# Patient Record
Sex: Female | Born: 1975 | Race: Black or African American | Hispanic: No | Marital: Married
Health system: Southern US, Community
[De-identification: ages and names within clinical notes are randomized; demographics above are authoritative.]

## PROBLEM LIST (undated history)

## (undated) DIAGNOSIS — Z800 Family history of malignant neoplasm of digestive organs: Secondary | ICD-10-CM

## (undated) DIAGNOSIS — F32A Depression, unspecified: Secondary | ICD-10-CM

## (undated) DIAGNOSIS — R011 Cardiac murmur, unspecified: Secondary | ICD-10-CM

## (undated) DIAGNOSIS — E7800 Pure hypercholesterolemia, unspecified: Secondary | ICD-10-CM

## (undated) DIAGNOSIS — D219 Benign neoplasm of connective and other soft tissue, unspecified: Secondary | ICD-10-CM

## (undated) DIAGNOSIS — N912 Amenorrhea, unspecified: Secondary | ICD-10-CM

## (undated) DIAGNOSIS — N83209 Unspecified ovarian cyst, unspecified side: Secondary | ICD-10-CM

## (undated) DIAGNOSIS — F329 Major depressive disorder, single episode, unspecified: Secondary | ICD-10-CM

## (undated) DIAGNOSIS — N946 Dysmenorrhea, unspecified: Secondary | ICD-10-CM

## (undated) DIAGNOSIS — Z803 Family history of malignant neoplasm of breast: Secondary | ICD-10-CM

## (undated) HISTORY — DX: Pure hypercholesterolemia, unspecified: E78.00

## (undated) HISTORY — DX: Dysmenorrhea, unspecified: N94.6

## (undated) HISTORY — DX: Family history of malignant neoplasm of digestive organs: Z80.0

## (undated) HISTORY — DX: Cardiac murmur, unspecified: R01.1

## (undated) HISTORY — DX: Amenorrhea, unspecified: N91.2

## (undated) HISTORY — DX: Depression, unspecified: F32.A

## (undated) HISTORY — DX: Major depressive disorder, single episode, unspecified: F32.9

## (undated) HISTORY — DX: Unspecified ovarian cyst, unspecified side: N83.209

## (undated) HISTORY — DX: Family history of malignant neoplasm of breast: Z80.3

## (undated) HISTORY — PX: ABDOMINAL HYSTERECTOMY: SHX81

## (undated) HISTORY — DX: Benign neoplasm of connective and other soft tissue, unspecified: D21.9

---

## 2010-11-07 HISTORY — PX: MYOMECTOMY: SHX85

## 2016-10-12 LAB — CBC AND DIFFERENTIAL
HEMATOCRIT: 38 (ref 36–46)
HEMOGLOBIN: 12.5 (ref 12.0–16.0)
PLATELETS: 262 (ref 150–399)
WBC: 4.2

## 2017-11-22 ENCOUNTER — Ambulatory Visit (INDEPENDENT_AMBULATORY_CARE_PROVIDER_SITE_OTHER): Payer: BC Managed Care – PPO | Admitting: Family Medicine

## 2017-11-22 ENCOUNTER — Encounter: Payer: Self-pay | Admitting: Family Medicine

## 2017-11-22 VITALS — BP 100/60 | HR 71 | Temp 98.4°F | Ht 63.5 in | Wt 129.9 lb

## 2017-11-22 DIAGNOSIS — Z7689 Persons encountering health services in other specified circumstances: Secondary | ICD-10-CM

## 2017-11-22 DIAGNOSIS — N926 Irregular menstruation, unspecified: Secondary | ICD-10-CM | POA: Diagnosis not present

## 2017-11-22 DIAGNOSIS — F5101 Primary insomnia: Secondary | ICD-10-CM | POA: Diagnosis not present

## 2017-11-22 NOTE — Progress Notes (Signed)
Patient presents to clinic today to establish care.  SUBJECTIVE: PMH: Patient is a 42 year old female with past medical history significant for depression, allergies, HLD, acne.  Patient was formally seen by provider in [place].  Insomnia: -Times 1 week -Patient states she normally does not have trouble with sleeping. -Patient notices she may fall asleep but wake up a few hours later. -Patient endorses recent vivid dreams involving family and people she has been in relationship with. -Patient denies stress at work.  Patient states she loves her job.  Patient does endorse some stress in her relationship that she refers to as "messy". -Pt works out early in the day, does not drink caffeine.  Irregular menses: -Patient endorses history of myomectomy for fibroids in 2012, but states there was an issue with the surgery resulting in the possibility that the patient may not be able to carry a child. -Since then patient endorses irregular periods -LMP August 18, 2017 -Patient denies possibility of being pregnant -Patient does not want to take hormonal forms of birth control to regulate her period. -Patient states her twin sister underwent hysterectomy for similar issues.  Allergies: Shellfish-diarrhea Flagyl-itching, itching of throat  Social history: Patient is single.  She has her PhD in [place].  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  Patient states she loves her job.  Patient denies current tobacco and drug use.  Patient endorses social alcohol use.  Family medical history: Mom-alive, arthritis, HLD Dad-deceased, DM, MI, heart disease, HTN, kidney disease Sister-Kim, alive Sister-Kiley, alive MGM-deceased, colon and lung cancer MGF-deceased, DM PGM-deceased, breast cancer status post mastectomy, DM, MI, heart disease PGF-deceased, DM, heart disease  Health Maintenance: Mammogram --May 2017 PAP --August  2017   Past Medical History:  Diagnosis Date  . Heart murmur     History reviewed. No pertinent surgical history.  No current outpatient medications on file prior to visit.   No current facility-administered medications on file prior to visit.     Allergies  Allergen Reactions  . Flagyl [Metronidazole] Anaphylaxis    History reviewed. No pertinent family history.  Social History   Socioeconomic History  . Marital status: Single    Spouse name: Not on file  . Number of children: Not on file  . Years of education: Not on file  . Highest education level: Not on file  Social Needs  . Financial resource strain: Not on file  . Food insecurity - worry: Not on file  . Food insecurity - inability: Not on file  . Transportation needs - medical: Not on file  . Transportation needs - non-medical: Not on file  Occupational History  . Not on file  Tobacco Use  . Smoking status: Former Smoker    Types: Cigars  . Smokeless tobacco: Never Used  Substance and Sexual Activity  . Alcohol use: Yes    Comment: rarely  . Drug use: No  . Sexual activity: Not on file  Other Topics Concern  . Not on file  Social History Narrative  . Not on file    ROS General: Denies fever, chills, night sweats, changes in weight, changes in appetite +insomnia HEENT: Denies headaches, ear pain, changes in vision, rhinorrhea, sore throat CV: Denies CP, palpitations, SOB, orthopnea Pulm: Denies SOB, cough, wheezing GI: Denies abdominal pain, nausea, vomiting, diarrhea, constipation GU: Denies dysuria, hematuria, frequency, vaginal discharge   +AUB Msk: Denies muscle cramps, joint pains Neuro: Denies weakness, numbness, tingling  Skin: Denies rashes, bruising Psych: Denies depression, anxiety, hallucinations  BP 100/60 (BP Location: Left Arm, Patient Position: Sitting, Cuff Size: Normal)   Pulse 71   Temp 98.4 F (36.9 C) (Oral)   Ht 5' 3.5" (1.613 m)   Wt 129 lb 14.4 oz (58.9 kg)   BMI 22.65  kg/m   Physical Exam General: Denies fever, chills, night sweats, changes in weight, changes in appetite HEENT: Denies headaches, ear pain, changes in vision, rhinorrhea, sore throat CV: Denies CP, palpitations, SOB, orthopnea Pulm: Denies SOB, cough, wheezing GI: Denies abdominal pain, nausea, vomiting, diarrhea, constipation GU: Denies dysuria, hematuria, frequency, vaginal discharge Msk: Denies muscle cramps, joint pains Neuro: Denies weakness, numbness, tingling Skin: Denies rashes, bruising Psych: Denies depression, anxiety, hallucinations  No results found for this or any previous visit (from the past 2160 hour(s)).  Assessment/Plan: Primary insomnia -discussed sleep hygiene -given handout -Will try modifying routine x 1 month -also discussed counseling.  Pt amenable to the idea. Given a list of area counselors.  Irregular menses  -given h/o myomectomy will refer to OB/Gyn - Plan: Ambulatory referral to Obstetrics / Gynecology  Encounter to establish care -We reviewed the PMH, PSH, FH, SH, Meds and Allergies. -We provided refills for any medications we will prescribe as needed. -We addressed current concerns per orders and patient instructions. -We have asked for records for pertinent exams, studies, vaccines and notes from previous providers. -We have advised patient to follow up per instructions below.   F/u in 1 month.  Can schedule CPE.   Grier Mitts, MD

## 2017-11-22 NOTE — Patient Instructions (Addendum)
Dysfunctional Uterine Bleeding Dysfunctional uterine bleeding is abnormal bleeding from the uterus. Dysfunctional uterine bleeding includes:  A period that comes earlier or later than usual.  A period that is lighter, heavier, or has blood clots.  Bleeding between periods.  Skipping one or more periods.  Bleeding after sexual intercourse.  Bleeding after menopause.  Follow these instructions at home: Pay attention to any changes in your symptoms. Follow these instructions to help with your condition: Eating and drinking  Eat well-balanced meals. Include foods that are high in iron, such as liver, meat, shellfish, green leafy vegetables, and eggs.  If you become constipated: ? Drink plenty of water. ? Eat fruits and vegetables that are high in water and fiber, such as spinach, carrots, raspberries, apples, and mango. Medicines  Take over-the-counter and prescription medicines only as told by your health care provider.  Do not change medicines without talking with your health care provider.  Aspirin or medicines that contain aspirin may make the bleeding worse. Do not take those medicines: ? During the week before your period. ? During your period.  If you were prescribed iron pills, take them as told by your health care provider. Iron pills help to replace iron that your body loses because of this condition. Activity  If you need to change your sanitary pad or tampon more than one time every 2 hours: ? Lie in bed with your feet raised (elevated). ? Place a cold pack on your lower abdomen. ? Rest as much as possible until the bleeding stops or slows down.  Do not try to lose weight until the bleeding has stopped and your blood iron level is back to normal. Other Instructions  For two months, write down: ? When your period starts. ? When your period ends. ? When any abnormal bleeding occurs. ? What problems you notice.  Keep all follow up visits as told by your health  care provider. This is important. Contact a health care provider if:  You get light-headed or weak.  You have nausea and vomiting.  You cannot eat or drink without vomiting.  You feel dizzy or have diarrhea while you are taking medicines.  You are taking birth control pills or hormones, and you want to change them or stop taking them. Get help right away if:  You develop a fever or chills.  You need to change your sanitary pad or tampon more than one time per hour.  Your bleeding becomes heavier, or your flow contains clots more often.  You develop pain in your abdomen.  You lose consciousness.  You develop a rash. This information is not intended to replace advice given to you by your health care provider. Make sure you discuss any questions you have with your health care provider. Document Released: 10/21/2000 Document Revised: 03/31/2016 Document Reviewed: 01/19/2015 Elsevier Interactive Patient Education  2018 [employer]. Insomnia Insomnia is a sleep disorder that makes it difficult to fall asleep or to stay asleep. Insomnia can cause tiredness (fatigue), low energy, difficulty concentrating, mood swings, and poor performance at work or school. There are three different ways to classify insomnia:  Difficulty falling asleep.  Difficulty staying asleep.  Waking up too early in the morning.  Any type of insomnia can be long-term (chronic) or short-term (acute). Both are common. Short-term insomnia usually lasts for three months or less. Chronic insomnia occurs at least three times a week for longer than three months. What are the causes? Insomnia may be caused by another  condition, situation, or substance, such as:  Anxiety.  Certain medicines.  Gastroesophageal reflux disease (GERD) or other gastrointestinal conditions.  Asthma or other breathing conditions.  Restless legs syndrome, sleep apnea, or other sleep disorders.  Chronic pain.  Menopause. This may  include hot flashes.  Stroke.  Abuse of alcohol, tobacco, or illegal drugs.  Depression.  Caffeine.  Neurological disorders, such as Alzheimer disease.  An overactive thyroid (hyperthyroidism).  The cause of insomnia may not be known. What increases the risk? Risk factors for insomnia include:  Gender. Women are more commonly affected than men.  Age. Insomnia is more common as you get older.  Stress. This may involve your professional or personal life.  Income. Insomnia is more common in people with lower income.  Lack of exercise.  Irregular work schedule or night shifts.  Traveling between different time zones.  What are the signs or symptoms? If you have insomnia, trouble falling asleep or trouble staying asleep is the main symptom. This may lead to other symptoms, such as:  Feeling fatigued.  Feeling nervous about going to sleep.  Not feeling rested in the morning.  Having trouble concentrating.  Feeling irritable, anxious, or depressed.  How is this treated? Treatment for insomnia depends on the cause. If your insomnia is caused by an underlying condition, treatment will focus on addressing the condition. Treatment may also include:  Medicines to help you sleep.  Counseling or therapy.  Lifestyle adjustments.  Follow these instructions at home:  Take medicines only as directed by your health care provider.  Keep regular sleeping and waking hours. Avoid naps.  Keep a sleep diary to help you and your health care provider figure out what could be causing your insomnia. Include: ? When you sleep. ? When you wake up during the night. ? How well you sleep. ? How rested you feel the next day. ? Any side effects of medicines you are taking. ? What you eat and drink.  Make your bedroom a comfortable place where it is easy to fall asleep: ? Put up shades or special blackout curtains to block light from outside. ? Use a white noise machine to block  noise. ? Keep the temperature cool.  Exercise regularly as directed by your health care provider. Avoid exercising right before bedtime.  Use relaxation techniques to manage stress. Ask your health care provider to suggest some techniques that may work well for you. These may include: ? Breathing exercises. ? Routines to release muscle tension. ? Visualizing peaceful scenes.  Cut back on alcohol, caffeinated beverages, and cigarettes, especially close to bedtime. These can disrupt your sleep.  Do not overeat or eat spicy foods right before bedtime. This can lead to digestive discomfort that can make it hard for you to sleep.  Limit screen use before bedtime. This includes: ? Watching TV. ? Using your smartphone, tablet, and computer.  Stick to a routine. This can help you fall asleep faster. Try to do a quiet activity, brush your teeth, and go to bed at the same time each night.  Get out of bed if you are still awake after 15 minutes of trying to sleep. Keep the lights down, but try reading or doing a quiet activity. When you feel sleepy, go back to bed.  Make sure that you drive carefully. Avoid driving if you feel very sleepy.  Keep all follow-up appointments as directed by your health care provider. This is important. Contact a health care provider if:  You  are tired throughout the day or have trouble in your daily routine due to sleepiness.  You continue to have sleep problems or your sleep problems get worse. Get help right away if:  You have serious thoughts about hurting yourself or someone else. This information is not intended to replace advice given to you by your health care provider. Make sure you discuss any questions you have with your health care provider. Document Released: 10/21/2000 Document Revised: 03/25/2016 Document Reviewed: 07/25/2014 Elsevier Interactive Patient Education  [employer].

## 2017-11-23 ENCOUNTER — Other Ambulatory Visit: Payer: Self-pay

## 2017-11-23 ENCOUNTER — Ambulatory Visit (INDEPENDENT_AMBULATORY_CARE_PROVIDER_SITE_OTHER): Payer: BC Managed Care – PPO | Admitting: Obstetrics and Gynecology

## 2017-11-23 ENCOUNTER — Encounter: Payer: Self-pay | Admitting: Obstetrics and Gynecology

## 2017-11-23 VITALS — BP 118/70 | HR 72 | Resp 16 | Ht 63.25 in | Wt 132.0 lb

## 2017-11-23 DIAGNOSIS — N946 Dysmenorrhea, unspecified: Secondary | ICD-10-CM | POA: Diagnosis not present

## 2017-11-23 DIAGNOSIS — D259 Leiomyoma of uterus, unspecified: Secondary | ICD-10-CM | POA: Diagnosis not present

## 2017-11-23 DIAGNOSIS — N926 Irregular menstruation, unspecified: Secondary | ICD-10-CM

## 2017-11-23 LAB — POCT URINE PREGNANCY: Preg Test, Ur: NEGATIVE

## 2017-11-23 MED ORDER — NAPROXEN SODIUM 550 MG PO TABS: ORAL_TABLET | ORAL | 2 refills | Status: DC

## 2017-11-23 MED ORDER — MEDROXYPROGESTERONE ACETATE 5 MG PO TABS: ORAL_TABLET | ORAL | 6 refills | Status: DC

## 2017-11-23 NOTE — Patient Instructions (Signed)
Uterine Fibroids Uterine fibroids are tissue masses (tumors) that can develop in the womb (uterus). They are also called leiomyomas. This type of tumor is not cancerous (benign) and does not spread to other parts of the body outside of the pelvic area, which is between the hip bones. Occasionally, fibroids may develop in the fallopian tubes, in the cervix, or on the support structures (ligaments) that surround the uterus. You can have one or many fibroids. Fibroids can vary in size, weight, and where they grow in the uterus. Some can become quite large. Most fibroids do not require medical treatment. What are the causes? A fibroid can develop when a single uterine cell keeps growing (replicating). Most cells in the human body have a control mechanism that keeps them from replicating without control. What are the signs or symptoms? Symptoms may include:  Heavy bleeding during your period.  Bleeding or spotting between periods.  Pelvic pain and pressure.  Bladder problems, such as needing to urinate more often (urinary frequency) or urgently.  Inability to reproduce offspring (infertility).  Miscarriages.  How is this diagnosed? Uterine fibroids are diagnosed through a physical exam. Your health care provider may feel the lumpy tumors during a pelvic exam. Ultrasonography and an MRI may be done to determine the size, location, and number of fibroids. How is this treated? Treatment may include:  Watchful waiting. This involves getting the fibroid checked by your health care provider to see if it grows or shrinks. Follow your health care provider's recommendations for how often to have this checked.  Hormone medicines. These can be taken by mouth or given through an intrauterine device (IUD).  Surgery. ? Removing the fibroids (myomectomy) or the uterus (hysterectomy). ? Removing blood supply to the fibroids (uterine artery embolization).  If fibroids interfere with your fertility and you  want to become pregnant, your health care provider may recommend having the fibroids removed. Follow these instructions at home:  Keep all follow-up visits as directed by your health care provider. This is important.  Take over-the-counter and prescription medicines only as told by your health care provider. ? If you were prescribed a hormone treatment, take the hormone medicines exactly as directed.  Ask your health care provider about taking iron pills and increasing the amount of dark green, leafy vegetables in your diet. These actions can help to boost your blood iron levels, which may be affected by heavy menstrual bleeding.  Pay close attention to your period and tell your health care provider about any changes, such as: ? Increased blood flow that requires you to use more pads or tampons than usual per month. ? A change in the number of days that your period lasts per month. ? A change in symptoms that are associated with your period, such as abdominal cramping or back pain. Contact a health care provider if:  You have pelvic pain, back pain, or abdominal cramps that cannot be controlled with medicines.  You have an increase in bleeding between and during periods.  You soak tampons or pads in a half hour or less.  You feel lightheaded, extra tired, or weak. Get help right away if:  You faint.  You have a sudden increase in pelvic pain. This information is not intended to replace advice given to you by your health care provider. Make sure you discuss any questions you have with your health care provider. Document Released: 10/21/2000 Document Revised: 06/23/2016 Document Reviewed: 04/22/2014 Elsevier Interactive Patient Education  2018 Elsevier Inc.  

## 2017-11-23 NOTE — Progress Notes (Addendum)
42 y.o. G0P0000 SingleAfrican AmericanF here for a consultation from Grier Mitts, MD for irregular menses and a h/o a fibroid uterus. Patient has not had menses since October 2018. Would like to discuss possible hysterectomy In 2006 her cycles started getting very heavy and irregular. In 2012, she had a myomectomy (via laparotomy), removed part of one of her tubes. She had an HSG and she had tubal blockage.  Over the last year her cycles have come every month, every 3-4 weeks. Then skipped her cycle in 11/18 and 12/18. Bleeds x 4-5 days. She can saturate a pad every 5-6 hours. Current she is having spotting. Wasn't bleeding in between her cycles prior to now. In the past she has gone up to 2 months without a cycle. Cramps are moderate, takes advil, 4-6 tablets, that helps. Sexually active with women, no current partner.  After her surgery she was told she wouldn't be able to carry a child full term.  In cycle in August was very painful, she had nausea, back pain, leg pain. Miserable x 5 hours.  No galactorrhea, no thyroid c/o.  She was on OCP's in the  Period Cycle (Days): (irregular) Period Duration (Days): 4 Period Pattern: (!) Irregular Menstrual Flow: Moderate, Heavy Menstrual Control: Maxi pad Menstrual Control Change Freq (Hours): 3-4 Dysmenorrhea: (!) Moderate Dysmenorrhea Symptoms: Cramping, Nausea, Other (Comment)(leg cramps)  Patient's last menstrual period was 08/14/2017.          Sexually active: No.  The current method of family planning is none.    Exercising: Yes.    weight lifting, yoga, cardio Smoker:  no  Health Maintenance: Pap:  2018 per patient in [place] - normal, in the summer History of abnormal Pap:  Yes, per patient, f/u was normal.  MMG: 12/12/14 per patient Colonoscopy:  none BMD:   none TDaP:  Unsure, thinks UTD Gardasil: n/a   reports that she has quit smoking. Her smoking use included cigars. she has never used smokeless tobacco. She reports that she drinks  alcohol. She reports that she does not use drugs. Rare ETOH. 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 Just moved here from [place] this summer.   Past Medical History:  Diagnosis Date  . Amenorrhea   . Depression   . Fibroid   . Heart murmur   . Menstrual cramps   . Ovarian cyst     Past Surgical History:  Procedure Laterality Date  . MYOMECTOMY  2012    Current Outpatient Medications  Medication Sig Dispense Refill  . Ascorbic Acid (VITAMIN C PO) Take by mouth daily.    Marland Kitchen BIOTIN PO Take by mouth.    . Cyanocobalamin (B-12 PO) Take by mouth daily.    . Folic [id number] (FOLIC ACID XTRA PO) Take by mouth daily.    Marland Kitchen MAGNESIUM GLUCONATE PO Take by mouth.    . Multiple Vitamin (MULTIVITAMIN) tablet Take 1 tablet by mouth daily.    . Probiotic Product (PROBIOTIC DAILY PO) Take by mouth.     No current facility-administered medications for this visit.     Family History  Problem Relation Age of Onset  . Fibroids Mother   . Diabetes Father   . Fibroids Sister   . Colon cancer Maternal Grandmother   . Lung cancer Maternal Grandmother   . Cancer Maternal Grandmother   . Cancer Maternal Grandfather   . Breast cancer Paternal Grandmother   . Cancer Paternal Grandmother     Review of Systems  Constitutional: Negative.  HENT: Negative.   Eyes: Negative.   Respiratory: Negative.   Cardiovascular: Negative.   Gastrointestinal: Negative.   Endocrine: Negative.   Genitourinary:       Irregular menses  Musculoskeletal: Negative.   Skin: Negative.   Allergic/Immunologic: Negative.   Neurological: Negative.   Hematological: Negative.   Psychiatric/Behavioral: Negative.     Exam:   BP 118/70 (BP Location: Right Arm, Patient Position: Sitting, Cuff Size: Normal)   Pulse 72   Resp 16   Ht 5' 3.25" (1.607 m)   Wt 132 lb (59.9 kg)   LMP 08/14/2017   BMI 23.20 kg/m   Weight change: @WEIGHTCHANGE @ Height:   Height: 5' 3.25" (160.7 cm)  Ht Readings from  Last 3 Encounters:  11/23/17 5' 3.25" (1.607 m)  11/22/17 5' 3.5" (1.613 m)    General appearance: alert, cooperative and appears stated age Head: Normocephalic, without obvious abnormality, atraumatic Neck: no adenopathy, supple, symmetrical, trachea midline and thyroid normal to inspection and palpation Lungs: clear to auscultation bilaterally Cardiovascular: regular rate and rhythm Abdomen: soft, non-tender; non distended,  no masses,  no organomegaly Extremities: extremities normal, atraumatic, no cyanosis or edema Skin: Skin color, texture, turgor normal. No rashes or lesions Lymph nodes: Cervical, supraclavicular, and axillary nodes normal. No abnormal inguinal nodes palpated Neurologic: Grossly normal   Pelvic: External genitalia:  no lesions              Urethra:  normal appearing urethra with no masses, tenderness or lesions              Bartholins and Skenes: normal                 Vagina: normal appearing vagina with normal color and discharge, no lesions              Cervix: no lesions               Bimanual Exam:  Uterus:  anteverted, mobile, slightly enlarged, not tender              Adnexa: no mass, fullness, tenderness               Rectovaginal: Confirms               Anus:  normal sphincter tone, no lesions  Chaperone was present for exam.  A:  Oligomenorrhea  Dysmenorrhea  H/O fibroid uterus  P:   TSH, prolactin  Discussed options of cyclic provera, OCP's and the mirena IUD for cycle control  Discussed the risks and side effects, currently she declines the mirena and OCP's  Provera called in  Recommend U/S   Anaprox DS for cramps  Get a copy of her myomectomy op note  CC:  Grier Mitts, MD Letter sent  Addendum: Op note and pathology report reviewed. 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 Four other fibroids ranging from 1.5-3.2 cm. Op note describes that the adenomyoma involved  a large portion of the endometrium posteriorly. "It appeared that the right tube had been disrupted. It was difficult to tell whether the left tube as disrupted." Interceed was placed over the uterine incisions. No chromopertubation was done.

## 2017-11-24 LAB — PROLACTIN: Prolactin: 6.5 ng/mL (ref 4.8–23.3)

## 2017-11-24 LAB — TSH: TSH: 0.929 u[IU]/mL (ref 0.450–4.500)

## 2017-11-27 ENCOUNTER — Encounter: Payer: Self-pay | Admitting: Family Medicine

## 2017-11-29 ENCOUNTER — Telehealth: Payer: Self-pay | Admitting: Obstetrics and Gynecology

## 2017-11-29 NOTE — Telephone Encounter (Signed)
Call placed to patient to review benefits for recommended ultrasound. Left voicemail message requesting a return call  °

## 2017-12-04 NOTE — Telephone Encounter (Signed)
Patient returned call. Reviewed benefit for ultrasound. Patient understood and agreeable. Patient ready to schedule. Patient scheduled 12/12/17 with Dr Talbert Nan. Patient aware of appointment date, arrival time and cancellation policy. No further questions. Ok to close

## 2017-12-07 ENCOUNTER — Encounter: Payer: Self-pay | Admitting: Family Medicine

## 2017-12-08 ENCOUNTER — Ambulatory Visit (INDEPENDENT_AMBULATORY_CARE_PROVIDER_SITE_OTHER): Payer: BC Managed Care – PPO | Admitting: Family Medicine

## 2017-12-08 ENCOUNTER — Other Ambulatory Visit (HOSPITAL_COMMUNITY)
Admission: RE | Admit: 2017-12-08 | Discharge: 2017-12-08 | Disposition: A | Discharge status: Home / Self Care | Payer: BC Managed Care – PPO | Source: Ambulatory Visit | Attending: Family Medicine | Admitting: Family Medicine

## 2017-12-08 ENCOUNTER — Encounter: Payer: Self-pay | Admitting: Family Medicine

## 2017-12-08 VITALS — BP 90/60 | HR 71 | Temp 98.1°F | Wt 129.7 lb

## 2017-12-08 DIAGNOSIS — Z0000 Encounter for general adult medical examination without abnormal findings: Secondary | ICD-10-CM | POA: Insufficient documentation

## 2017-12-08 NOTE — Patient Instructions (Addendum)
Preventive Care 18-39 Years, Female Preventive care refers to lifestyle choices and visits with your health care provider that can promote health and wellness. What does preventive care include?  A yearly physical exam. This is also called an annual well check.  Dental exams once or twice a year.  Routine eye exams. Ask your health care provider how often you should have your eyes checked.  Personal lifestyle choices, including: ? Daily care of your teeth and gums. ? Regular physical activity. ? Eating a healthy diet. ? Avoiding tobacco and drug use. ? Limiting alcohol use. ? Practicing safe sex. ? Taking vitamin and mineral supplements as recommended by your health care provider. What happens during an annual well check? The services and screenings done by your health care provider during your annual well check will depend on your age, overall health, lifestyle risk factors, and family history of disease. Counseling Your health care provider may ask you questions about your:  Alcohol use.  Tobacco use.  Drug use.  Emotional well-being.  Home and relationship well-being.  Sexual activity.  Eating habits.  Work and work Statistician.  Method of birth control.  Menstrual cycle.  Pregnancy history.  Screening You may have the following tests or measurements:  Height, weight, and BMI.  Diabetes screening. This is done by checking your blood sugar (glucose) after you have not eaten for a while (fasting).  Blood pressure.  Lipid and cholesterol levels. These may be checked every 5 years starting at age 66.  Skin check.  Hepatitis C blood test.  Hepatitis B blood test.  Sexually transmitted disease (STD) testing.  BRCA-related cancer screening. This may be done if you have a family history of breast, ovarian, tubal, or peritoneal cancers.  Pelvic exam and Pap test. This may be done every 3 years starting at age 40. Starting at age 59, this may be done every 5  years if you have a Pap test in combination with an HPV test.  Discuss your test results, treatment options, and if necessary, the need for more tests with your health care provider. Vaccines Your health care provider may recommend certain vaccines, such as:  Influenza vaccine. This is recommended every year.  Tetanus, diphtheria, and acellular pertussis (Tdap, Td) vaccine. You may need a Td booster every 10 years.  Varicella vaccine. You may need this if you have not been vaccinated.  HPV vaccine. If you are 69 or younger, you may need three doses over 6 months.  Measles, mumps, and rubella (MMR) vaccine. You may need at least one dose of MMR. You may also need a second dose.  Pneumococcal 13-valent conjugate (PCV13) vaccine. You may need this if you have certain conditions and were not previously vaccinated.  Pneumococcal polysaccharide (PPSV23) vaccine. You may need one or two doses if you smoke cigarettes or if you have certain conditions.  Meningococcal vaccine. One dose is recommended if you are age 27-21 years and a first-year college student living in a residence hall, or if you have one of several medical conditions. You may also need additional booster doses.  Hepatitis A vaccine. You may need this if you have certain conditions or if you travel or work in places where you may be exposed to hepatitis A.  Hepatitis B vaccine. You may need this if you have certain conditions or if you travel or work in places where you may be exposed to hepatitis B.  Haemophilus influenzae type b (Hib) vaccine. You may need this if  you have certain risk factors.  Talk to your health care provider about which screenings and vaccines you need and how often you need them. This information is not intended to replace advice given to you by your health care provider. Make sure you discuss any questions you have with your health care provider. Document Released: 12/20/2001 Document Revised: 07/13/2016  Document Reviewed: 08/25/2015 Elsevier Interactive Patient Education  [employer].

## 2017-12-08 NOTE — Progress Notes (Signed)
Subjective:     Meredith Lee is a 42 y.o. female and is here for a comprehensive physical exam. The patient reports no problems since last OFV.  Pt does report her menses finally started.  Social History   Socioeconomic History  . Marital status: Single    Spouse name: Not on file  . Number of children: Not on file  . Years of education: Not on file  . Highest education level: Not on file  Social Needs  . Financial resource strain: Not on file  . Food insecurity - worry: Not on file  . Food insecurity - inability: Not on file  . Transportation needs - medical: Not on file  . Transportation needs - non-medical: Not on file  Occupational History  . Not on file  Tobacco Use  . Smoking status: Former Smoker    Types: Cigars  . Smokeless tobacco: Never Used  Substance and Sexual Activity  . Alcohol use: Yes    Comment: rarely  . Drug use: No  . Sexual activity: Not Currently    Birth control/protection: None    Comment: female partner  Other Topics Concern  . Not on file  Social History Narrative  . Not on file   Health Maintenance  Topic Date Due  . TETANUS/TDAP  06/29/1997  . PAP SMEAR  06/30/1999  . INFLUENZA VACCINE  07/25/2018 (Originally 06/07/2017)  . HIV Screening  11/22/2018 (Originally 06/29/1993)    The following portions of the patient's history were reviewed and updated as appropriate: allergies, current medications, past family history, past medical history, past social history, past surgical history and problem list.  Review of Systems A comprehensive review of systems was negative.   Objective:    BP 90/60 (BP Location: Right Arm, Patient Position: Sitting, Cuff Size: Normal)   Pulse 71   Temp 98.1 F (36.7 C) (Oral)   Wt 129 lb 11.2 oz (58.8 kg)   BMI 22.79 kg/m  General appearance: alert, cooperative and appears stated age Head: Normocephalic, without obvious abnormality, atraumatic Eyes: conjunctivae/corneas clear. PERRL, EOM's intact.  Fundi benign.  Wearing glasses Ears: normal TM's and external ear canals both ears Nose: Nares normal. Septum midline. Mucosa normal. No drainage or sinus tenderness. Throat: lips, mucosa, and tongue normal; teeth and gums normal Neck: no adenopathy, no carotid bruit, no JVD, supple, symmetrical, trachea midline and thyroid not enlarged, symmetric, no tenderness/mass/nodules Lungs: clear to auscultation bilaterally Breasts: normal appearance, no masses or tenderness Heart: regular rate and rhythm, S1, S2 normal, no murmur, click, rub or gallop No edema  MSK:  FROM, no deformitie Abd: BS present, soft, NT/ND, no hepatosplenomegaly Neuro: ANO x3, CN II through XII grossly intact, normal gait. GU: Normal external female genitalia without lesions, normal vaginal rugae without lesions, cervix normal in appearance, no CMT.  Pap obtained.  Present. Lymph: No cervical lymphadenopathy no axillary lymphadenopathy Pulses: 2+ throughout Psych: Mood appropriate, normal affect  Assessment:    Healthy female exam.      Plan:      Anticipatory guidance given including wearing seatbelts, increasing physical activity, increasing p.o. intake of water, smoke detectors in the home, limiting alcohol intake. -Patient recently had labs at outside office which were normal. -Pap smear obtained this visit.  Will call patient with results -Immunizations up-to-date.  Patient offered influenza vaccine but declines. See After Visit Summary for Counseling Recommendations   Follow-up PRN.  Next CPE in 1 year.  Grier Mitts, MD

## 2017-12-12 ENCOUNTER — Other Ambulatory Visit: Payer: Self-pay

## 2017-12-12 ENCOUNTER — Ambulatory Visit (INDEPENDENT_AMBULATORY_CARE_PROVIDER_SITE_OTHER): Payer: BC Managed Care – PPO | Admitting: Obstetrics and Gynecology

## 2017-12-12 ENCOUNTER — Encounter: Payer: Self-pay | Admitting: Obstetrics and Gynecology

## 2017-12-12 ENCOUNTER — Ambulatory Visit (INDEPENDENT_AMBULATORY_CARE_PROVIDER_SITE_OTHER): Payer: BC Managed Care – PPO

## 2017-12-12 VITALS — BP 108/60 | HR 64 | Resp 16 | Wt 129.0 lb

## 2017-12-12 DIAGNOSIS — N939 Abnormal uterine and vaginal bleeding, unspecified: Secondary | ICD-10-CM | POA: Diagnosis not present

## 2017-12-12 DIAGNOSIS — D259 Leiomyoma of uterus, unspecified: Secondary | ICD-10-CM | POA: Diagnosis not present

## 2017-12-12 DIAGNOSIS — N946 Dysmenorrhea, unspecified: Secondary | ICD-10-CM

## 2017-12-12 MED ORDER — MEDROXYPROGESTERONE ACETATE 5 MG PO TABS: ORAL_TABLET | ORAL | 1 refills | Status: DC

## 2017-12-12 NOTE — Progress Notes (Signed)
GYNECOLOGY  VISIT   HPI: 42 y.o.   Single  African American  female   [id number] with No LMP recorded. Patient is not currently having periods (Reason: Irregular Periods).   here for  ultrasound  The patient was seen last month c/o irregular bleeding. She had been cycling every 3-4 weeks x 4-5 days until 10/18. She then skipped her cycle in 11/18 and 12/18. Some spotting in 1/19, then went on to get her cycle on 11/25/17. She bleed for 7 days, medium flow, tolerable cramps. Passed blot clots. She started spotting again today. In the past she has gone up to 2 months without a cycle. She typically has moderate cramps, occasionally severe cramps.  H/O fibroid uterus and myomectomy (via laparotomy).  She declines OCP's and the mirena IUD, was given a script for anaprox ds and provera at her last visit. She didn't need to take the provera because her cycle started on it's own.  Recent normal TSH and prolactin.  She was previously told she couldn't carry a pregnancy because of her myomectomies.   GYNECOLOGIC HISTORY: No LMP recorded. Patient is not currently having periods (Reason: Irregular Periods). Contraception:female partner Menopausal hormone therapy: none        OB History    Gravida Para Term Preterm AB Living   0 0 0 0 0 0   SAB TAB Ectopic Multiple Live Births   0 0 0 0 0         There are no active problems to display for this patient.   Past Medical History:  Diagnosis Date  . Amenorrhea   . Depression   . Fibroid   . Heart murmur   . Menstrual cramps   . Ovarian cyst     Past Surgical History:  Procedure Laterality Date  . MYOMECTOMY  2012    Current Outpatient Medications  Medication Sig Dispense Refill  . Ascorbic Acid (VITAMIN C PO) Take by mouth daily.    Marland Kitchen BIOTIN PO Take by mouth.    . Cyanocobalamin (B-12 PO) Take by mouth daily.    . Folic [id number] (FOLIC ACID XTRA PO) Take by mouth daily.    Marland Kitchen MAGNESIUM GLUCONATE PO Take by mouth.    .  Multiple Vitamin (MULTIVITAMIN) tablet Take 1 tablet by mouth daily.    . Probiotic Product (PROBIOTIC DAILY PO) Take by mouth.     No current facility-administered medications for this visit.      ALLERGIES: Flagyl [metronidazole] and Shellfish allergy  Family History  Problem Relation Age of Onset  . Fibroids Mother   . Diabetes Father   . Fibroids Sister   . Colon cancer Maternal Grandmother   . Lung cancer Maternal Grandmother   . Cancer Maternal Grandmother   . Cancer Maternal Grandfather   . Breast cancer Paternal Grandmother   . Cancer Paternal Grandmother     Social History   Socioeconomic History  . Marital status: Single    Spouse name: Not on file  . Number of children: Not on file  . Years of education: Not on file  . Highest education level: Not on file  Social Needs  . Financial resource strain: Not on file  . Food insecurity - worry: Not on file  . Food insecurity - inability: Not on file  . Transportation needs - medical: Not on file  . Transportation needs - non-medical: Not on file  Occupational History  . Not on file  Tobacco Use  . Smoking status:  Former Smoker    Types: Cigars  . Smokeless tobacco: Never Used  Substance and Sexual Activity  . Alcohol use: Yes    Comment: rarely  . Drug use: No  . Sexual activity: Not Currently    Birth control/protection: None    Comment: female partner  Other Topics Concern  . Not on file  Social History Narrative  . Not on file    Review of Systems  Constitutional: Negative.   HENT: Negative.   Eyes: Negative.   Respiratory: Negative.   Cardiovascular: Negative.   Gastrointestinal: Negative.   Genitourinary: Negative.   Musculoskeletal: Negative.   Skin: Negative.   Neurological: Negative.   Endo/Heme/Allergies: Negative.   Psychiatric/Behavioral: Negative.     PHYSICAL EXAMINATION:    There were no vitals taken for this visit.    General appearance: alert, cooperative and appears stated  age  Ultrasound images were reviewed with the patient, several small fibroids noted. Cystic structure next to the endometrium (?scar tissue from prior myomectomies, benign appearing)  ASSESSMENT AUB,normal TSH and prolactin Dysmenorrhea, only occasionally severe Fibroid uterus  She has questions about the possibility of carrying a pregnancy     PLAN She will calendar her cycles and f/u in 3 months She has anaprox for cramps if needed Will uses cyclic provera every other month if no spontaneous menses I told her I didn't see any obvious defects in her myometrium that would give me concern about her getting pregnant, but would recommend she have a consultation with a fertility specialist if desired (she also has a h/o tubal blockage). She will let me know if she wants to do this.    An After Visit Summary was printed and given to the patient.  Over 10 minutes face to face time of which over 50% was spent in counseling.   CC: Dr Volanda Napoleon

## 2017-12-13 LAB — CYTOLOGY - PAP
DIAGNOSIS: UNDETERMINED — AB
HPV: DETECTED — AB

## 2017-12-15 ENCOUNTER — Telehealth: Payer: Self-pay | Admitting: Obstetrics and Gynecology

## 2017-12-15 DIAGNOSIS — R87610 Atypical squamous cells of undetermined significance on cytologic smear of cervix (ASC-US): Secondary | ICD-10-CM

## 2017-12-15 DIAGNOSIS — R87810 Cervical high risk human papillomavirus (HPV) DNA test positive: Principal | ICD-10-CM

## 2017-12-15 NOTE — Telephone Encounter (Signed)
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 They told me to call your office and schedule an appointment."

## 2017-12-15 NOTE — Telephone Encounter (Signed)
Spoke with patient. Advised patient Dr. Talbert Nan will review PAP results dated 12/08/17, will return call with recommendations.   Brief explanation of colpo provided, questions answered.   Patient is SA, female partner.   LMP 12/11/17.   Dr. Talbert Nan  -ASCUS PAP, positive hpv, ok to proceed with colpo?

## 2017-12-15 NOTE — Telephone Encounter (Signed)
Yes, please set her up for a colposcopy

## 2017-12-15 NOTE — Telephone Encounter (Signed)
Spoke with patient. Colposcopy scheduled for 12/19/2017 at 11:30 am with Dr.Jertson. Patient is agreeable to date and time.   Instructions given. Motrin 800 mg po x , one hour before appointment with food. Make sure to eat a meal before appointment and drink plenty of fluids. Patient verbalized understanding and will call to reschedule if will be on menses or has any concerns regarding pregnancy. Patient agreeable and verbalized understanding of all instructions. Order placed.  Routing to provider for final review. Patient agreeable to disposition. Will close encounter.

## 2017-12-19 ENCOUNTER — Ambulatory Visit: Payer: BC Managed Care – PPO | Admitting: Obstetrics and Gynecology

## 2017-12-19 ENCOUNTER — Encounter: Payer: Self-pay | Admitting: Obstetrics and Gynecology

## 2017-12-19 ENCOUNTER — Other Ambulatory Visit: Payer: Self-pay

## 2017-12-19 VITALS — BP 102/60 | HR 76 | Resp 14 | Wt 129.0 lb

## 2017-12-19 DIAGNOSIS — Z01812 Encounter for preprocedural laboratory examination: Secondary | ICD-10-CM

## 2017-12-19 DIAGNOSIS — R87610 Atypical squamous cells of undetermined significance on cytologic smear of cervix (ASC-US): Secondary | ICD-10-CM | POA: Diagnosis not present

## 2017-12-19 DIAGNOSIS — R87810 Cervical high risk human papillomavirus (HPV) DNA test positive: Secondary | ICD-10-CM | POA: Diagnosis not present

## 2017-12-19 LAB — POCT URINE PREGNANCY: Preg Test, Ur: NEGATIVE

## 2017-12-19 NOTE — Progress Notes (Signed)
GYNECOLOGY  VISIT   HPI: 42 y.o.   Single  African American  female   [place] with Patient's last menstrual period was 12/18/2017.   here for a colposcopy, recent pap with her primary returned with ASCUS, +HPV     GYNECOLOGIC HISTORY: Patient's last menstrual period was 12/18/2017. Contraception:none  Menopausal hormone therapy: none         OB History    Gravida Para Term Preterm AB Living   0 0 0 0 0 0   SAB TAB Ectopic Multiple Live Births   0 0 0 0 0         There are no active problems to display for this patient.   Past Medical History:  Diagnosis Date  . Amenorrhea   . Depression   . Fibroid   . Heart murmur   . Menstrual cramps   . Ovarian cyst     Past Surgical History:  Procedure Laterality Date  . MYOMECTOMY  2012    Current Outpatient Medications  Medication Sig Dispense Refill  . Ascorbic Acid (VITAMIN C PO) Take by mouth daily.    Marland Kitchen BIOTIN PO Take by mouth.    . Cyanocobalamin (B-12 PO) Take by mouth daily.    . Folic [id number] (FOLIC ACID XTRA PO) Take by mouth daily.    Marland Kitchen MAGNESIUM GLUCONATE PO Take by mouth.    . medroxyPROGESTERone (PROVERA) 5 MG tablet Take one tablet a day for 5 days every other month if no spontaneous cycle. 15 tablet 1  . Multiple Vitamin (MULTIVITAMIN) tablet Take 1 tablet by mouth daily.    . Probiotic Product (PROBIOTIC DAILY PO) Take by mouth.     No current facility-administered medications for this visit.      ALLERGIES: Flagyl [metronidazole] and Shellfish allergy  Family History  Problem Relation Age of Onset  . Fibroids Mother   . Diabetes Father   . Fibroids Sister   . Colon cancer Maternal Grandmother   . Lung cancer Maternal Grandmother   . Cancer Maternal Grandmother   . Cancer Maternal Grandfather   . Breast cancer Paternal Grandmother   . Cancer Paternal Grandmother     Social History   Socioeconomic History  . Marital status: Single    Spouse name: Not on file  . Number of  children: Not on file  . Years of education: Not on file  . Highest education level: Not on file  Social Needs  . Financial resource strain: Not on file  . Food insecurity - worry: Not on file  . Food insecurity - inability: Not on file  . Transportation needs - medical: Not on file  . Transportation needs - non-medical: Not on file  Occupational History  . Not on file  Tobacco Use  . Smoking status: Former Smoker    Types: Cigars  . Smokeless tobacco: Never Used  Substance and Sexual Activity  . Alcohol use: Yes    Comment: rarely  . Drug use: No  . Sexual activity: Not Currently    Birth control/protection: None    Comment: female partner  Other Topics Concern  . Not on file  Social History Narrative  . Not on file    Review of Systems  Constitutional: Negative.   HENT: Negative.   Eyes: Negative.   Respiratory: Negative.   Cardiovascular: Negative.   Gastrointestinal: Negative.   Genitourinary: Negative.   Musculoskeletal: Negative.   Skin: Negative.   Neurological: Negative.   Endo/Heme/Allergies: Negative.  Psychiatric/Behavioral: Negative.     PHYSICAL EXAMINATION:    BP 102/60 (BP Location: Right Arm, Patient Position: Sitting, Cuff Size: Normal)   Pulse 76   Resp 14   Wt 129 lb (58.5 kg)   LMP 12/18/2017   BMI 22.67 kg/m     General appearance: alert, cooperative and appears stated age   Pelvic: External genitalia:  no lesions              Urethra:  normal appearing urethra with no masses, tenderness or lesions              Bartholins and Skenes: normal                 Vagina: normal appearing vagina with normal color and discharge, no lesions              Cervix:no gross lesions  Colposcopy: unsatisfactory, no aceto-white changes, negative lugols examination of the cervix and vagina. ECC done  ASSESSMENT ASCUS, +HPV pap    PLAN Explained her abnormal pap, discussed need for f/u and possible need for treatment Colposocopy with ECC Further  plans based on the results    An After Visit Summary was printed and given to the patient.  10 minutes face to face time of which over 50% was spent in counseling.   CC: Grier Mitts, MD

## 2017-12-19 NOTE — Patient Instructions (Signed)

## 2017-12-22 ENCOUNTER — Telehealth: Payer: Self-pay

## 2017-12-22 NOTE — Telephone Encounter (Signed)
-----   Message from Salvadore Dom, MD sent at 12/21/2017  4:54 PM EST ----- Please inform the patient that her cervical biopsy (E[facility]) was negative. She should have a f/u pap and hpv with Dr Volanda Napoleon next year.  CC: Dr Volanda Napoleon

## 2017-12-22 NOTE — Telephone Encounter (Signed)
Results given to patient as seen below. Patient verbalizes understanding. Encounter closed.

## 2017-12-26 ENCOUNTER — Telehealth: Payer: Self-pay | Admitting: Family Medicine

## 2017-12-26 NOTE — Telephone Encounter (Signed)
Copied from [place]. Topic: Quick Communication - See Telephone Encounter >> Dec 26, 2017 12:14 PM Boyd Kerbs wrote: CRM for notification. See Telephone encounter for:   She went to see referred physician and wants to follow up. Please call her   12/26/17.

## 2018-01-01 NOTE — Telephone Encounter (Signed)
Please advise 

## 2018-01-03 NOTE — Telephone Encounter (Signed)
Pt called.  She was not sure if this provider was aware of her colpo results.  This provider informed pt that she had been made aware of the results by her OB/Gyn provider.

## 2018-03-12 ENCOUNTER — Telehealth: Payer: Self-pay | Admitting: Obstetrics and Gynecology

## 2018-03-12 NOTE — Telephone Encounter (Signed)
Patient pressed cancel at her automated reminder call to see Dr. Talbert Nan on 03/14/18 for a three month recheck. I returned her call and left her a message to call back and reschedule. Routing to provider for review.

## 2018-03-14 ENCOUNTER — Ambulatory Visit: Payer: BC Managed Care – PPO | Admitting: Obstetrics and Gynecology

## 2018-04-09 ENCOUNTER — Encounter: Payer: Self-pay | Admitting: Family Medicine

## 2018-04-09 ENCOUNTER — Ambulatory Visit: Payer: BC Managed Care – PPO | Admitting: Family Medicine

## 2018-04-09 VITALS — BP 100/58 | HR 77 | Temp 98.3°F | Ht 63.25 in | Wt 128.1 lb

## 2018-04-09 DIAGNOSIS — M546 Pain in thoracic spine: Secondary | ICD-10-CM

## 2018-04-09 MED ORDER — CYCLOBENZAPRINE HCL 5 MG PO TABS: 5.0000 mg | ORAL_TABLET | Freq: Three times a day (TID) | ORAL | 1 refills | Status: DC | PRN

## 2018-04-09 NOTE — Patient Instructions (Signed)
Radicular Pain Radicular pain is a type of pain that spreads from your back or neck along a spinal nerve. Spinal nerves are nerves that leave the spinal cord and go to the muscles. Radicular pain occurs when one of these nerves becomes irritated or squeezed (compressed). Radicular pain is sometimes called radiculopathy, radiculitis, or a pinched nerve. When you have this type of pain, you may also have weakness, numbness, or tingling in the area of your body that is supplied by the nerve. The pain may feel sharp and burning. Spinal nerves leave the spinal cord through openings between the 24 bones (vertebrae) that make up the spine. Radicular pain is often caused by something pushing on a spinal nerve. This pushing may be done by a vertebra or by one of the round cushions between vertebrae (intervertebral disks). This can result from an injury, from wear and tear or aging of a disk, or from the growth of a bone spur that pushes on the nerve. Radicular pain can occur in various areas depending on which spinal nerve is affected:  Cervical radicular pain occurs in the neck. You may also feel pain, numbness, weakness, or tingling in the arms.  Thoracic radicular pain occurs in the mid-spine area. You would feel this pain in the back and chest. This type is rare.  Lumbar radicular pain occurs in the lower back area. You would feel this pain as low back pain. You may feel pain, numbness, weakness, or tingling in the buttocks or legs. Sciatica is a type of lumbar radicular pain that shoots down the back of the leg.  Radicular pain often goes away when you follow instructions from your health care provider for relieving pain at home. Follow these instructions at home: Managing pain  If directed, apply ice to the affected area: ? Put ice in a plastic bag. ? Place a towel between your skin and the bag. ? Leave the ice on for 20 minutes, 2-3 times a day.  If directed, apply heat to the affected area as often  as told by your health care provider. Use the heat source that your health care provider recommends, such as a moist heat pack or a heating pad. ? Place a towel between your skin and the heat source. ? Leave the heat on for 20-30 minutes. ? Remove the heat if your skin turns bright red. This is especially important if you are unable to feel pain, heat, or cold. You may have a greater risk of getting burned. Activity   Do not sit or rest in bed for long periods of time.  Try to stay as active as possible. Ask your health care provider what type of exercise or activity is best for you.  Avoid activities that make your pain worse, such as bending and lifting.  Do not lift anything that is heavier than 10 lb (4.5 kg). Practice using proper technique when lifting items. Proper lifting technique involves bending your knees and rising up.  Do strength and range-of-motion exercises only as told by your health care provider. General instructions  Take over-the-counter and prescription medicines only as told by your health care provider.  Pay attention to any changes in your symptoms.  Keep all follow-up visits as told by your health care provider. This is important. Contact a health care provider if:  Your pain and other symptoms get worse.  Your pain medicine is not helping.  Your pain has not improved after a few weeks of home care.    You have a fever. Get help right away if:  You have severe pain, weakness, or numbness.  You have difficulty with bladder or bowel control. This information is not intended to replace advice given to you by your health care provider. Make sure you discuss any questions you have with your health care provider. Document Released: 12/01/2004 Document Revised: 03/31/2016 Document Reviewed: 05/20/2015 Elsevier Interactive Patient Education  2018 [employer].  Back Exercises If you have pain in your back, do these exercises 2-3 times each day or as told by  your doctor. When the pain goes away, do the exercises once each day, but repeat the steps more times for each exercise (do more repetitions). If you do not have pain in your back, do these exercises once each day or as told by your doctor. Exercises Single Knee to Chest  Do these steps 3-5 times in a row for each leg: 1. Lie on your back on a firm bed or the floor with your legs stretched out. 2. Bring one knee to your chest. 3. Hold your knee to your chest by grabbing your knee or thigh. 4. Pull on your knee until you feel a gentle stretch in your lower back. 5. Keep doing the stretch for 10-30 seconds. 6. Slowly let go of your leg and straighten it.  Pelvic Tilt  Do these steps 5-10 times in a row: 1. Lie on your back on a firm bed or the floor with your legs stretched out. 2. Bend your knees so they point up to the ceiling. Your feet should be flat on the floor. 3. Tighten your lower belly (abdomen) muscles to press your lower back against the floor. This will make your tailbone point up to the ceiling instead of pointing down to your feet or the floor. 4. Stay in this position for 5-10 seconds while you gently tighten your muscles and breathe evenly.  Cat-Cow  Do these steps until your lower back bends more easily: 1. Get on your hands and knees on a firm surface. Keep your hands under your shoulders, and keep your knees under your hips. You may put padding under your knees. 2. Let your head hang down, and make your tailbone point down to the floor so your lower back is round like the back of a cat. 3. Stay in this position for 5 seconds. 4. Slowly lift your head and make your tailbone point up to the ceiling so your back hangs low (sags) like the back of a cow. 5. Stay in this position for 5 seconds.  Press-Ups  Do these steps 5-10 times in a row: 1. Lie on your belly (face-down) on the floor. 2. Place your hands near your head, about shoulder-width apart. 3. While you keep  your back relaxed and keep your hips on the floor, slowly straighten your arms to raise the top half of your body and lift your shoulders. Do not use your back muscles. To make yourself more comfortable, you may change where you place your hands. 4. Stay in this position for 5 seconds. 5. Slowly return to lying flat on the floor.  Bridges  Do these steps 10 times in a row: 1. Lie on your back on a firm surface. 2. Bend your knees so they point up to the ceiling. Your feet should be flat on the floor. 3. Tighten your butt muscles and lift your butt off of the floor until your waist is almost as high as your knees. If  you do not feel the muscles working in your butt and the back of your thighs, slide your feet 1-2 inches farther away from your butt. 4. Stay in this position for 3-5 seconds. 5. Slowly lower your butt to the floor, and let your butt muscles relax.  If this exercise is too easy, try doing it with your arms crossed over your chest. Belly Crunches  Do these steps 5-10 times in a row: 1. Lie on your back on a firm bed or the floor with your legs stretched out. 2. Bend your knees so they point up to the ceiling. Your feet should be flat on the floor. 3. Cross your arms over your chest. 4. Tip your chin a little bit toward your chest but do not bend your neck. 5. Tighten your belly muscles and slowly raise your chest just enough to lift your shoulder blades a tiny bit off of the floor. 6. Slowly lower your chest and your head to the floor.  Back Lifts Do these steps 5-10 times in a row: 1. Lie on your belly (face-down) with your arms at your sides, and rest your forehead on the floor. 2. Tighten the muscles in your legs and your butt. 3. Slowly lift your chest off of the floor while you keep your hips on the floor. Keep the back of your head in line with the curve in your back. Look at the floor while you do this. 4. Stay in this position for 3-5 seconds. 5. Slowly lower your  chest and your face to the floor.  Contact a doctor if:  Your back pain gets a lot worse when you do an exercise.  Your back pain does not lessen 2 hours after you exercise. If you have any of these problems, stop doing the exercises. Do not do them again unless your doctor says it is okay. Get help right away if:  You have sudden, very bad back pain. If this happens, stop doing the exercises. Do not do them again unless your doctor says it is okay. This information is not intended to replace advice given to you by your health care provider. Make sure you discuss any questions you have with your health care provider. Document Released: 11/26/2010 Document Revised: 03/31/2016 Document Reviewed: 12/18/2014 Elsevier Interactive Patient Education  [employer].

## 2018-04-09 NOTE — Progress Notes (Signed)
Subjective:    Patient ID: Meredith Lee, female    DOB: 05-27-1976, 42 y.o.   MRN: [id number]  Chief Complaint  Patient presents with  . Shoulder Pain    patient complains of intra-scapular pain x8 days, denies an injury however the patient states she did work out the day before and week prior    HPI Patient was seen today for acute concern.  Pt endorses an intermittent sharp pinch between her R shoulder blade and spine x a little over a wk.  The feeling can happen with turning her head to the R or flexion of the cervical spine.  When the feeling first happened pt felt the sensation in her chest as well.  Pt denies recent changes in her routine.  Pt has been working out daily but has been doing her typical routine of weight training.  Pt got a massage on Sunday which actually made feeling worse.  Pt has been using heat, Advil, icy hot, yoga, and a tennis ball to try to relieve the feeling.  Past Medical History:  Diagnosis Date  . Amenorrhea   . Depression   . Fibroid   . Heart murmur   . Menstrual cramps   . Ovarian cyst     Allergies  Allergen Reactions  . Flagyl [Metronidazole] Anaphylaxis  . Shellfish Allergy Diarrhea    ROS General: Denies fever, chills, night sweats, changes in weight, changes in appetite HEENT: Denies headaches, ear pain, changes in vision, rhinorrhea, sore throat CV: Denies CP, palpitations, SOB, orthopnea Pulm: Denies SOB, cough, wheezing GI: Denies abdominal pain, nausea, vomiting, diarrhea, constipation GU: Denies dysuria, hematuria, frequency, vaginal discharge Msk: Denies muscle cramps, joint pains Neuro: Denies weakness, numbness, tingling  +sharp pinch in back Skin: Denies rashes, bruising Psych: Denies depression, anxiety, hallucinations     Objective:    Blood pressure (!) 100/58, pulse 77, temperature 98.3 F (36.8 C), temperature source Oral, height 5' 3.25" (1.607 m), weight 128 lb 1.6 oz (58.1 kg).   Gen. Pleasant,  well-nourished, in no distress, normal affect   HEENT: [place]/AT, face symmetric, no scleral icterus, PERRLA, nares patent without drainage, pharynx without erythema or exudate. Lungs: no accessory muscle use Cardiovascular: RRR, no peripheral edema Musculoskeletal: No deformities, no cyanosis or clubbing, normal tone.  FROM of b/l UEs, no TTP of spine or paraspinal muscles.  No trigger points noted.  FROM of cervical spine.  Pain reproduced with flexion of cervical spine and movement of head to the right. Neuro:  A&Ox3, CN II-XII intact, normal gait   Wt Readings from Last 3 Encounters:  04/09/18 128 lb 1.6 oz (58.1 kg)  12/19/17 129 lb (58.5 kg)  12/12/17 129 lb (58.5 kg)    Lab Results  Component Value Date   WBC 4.2 10/12/2016   HGB 12.5 10/12/2016   HCT 38 10/12/2016   PLT 262 10/12/2016   TSH 0.929 11/23/2017    Assessment/Plan:  Acute right-sided thoracic back pain  -discussed possible causes including muscle strain, radiculopathy, etc -discussed rest, ice/heat, massage, NSAIDs -given handout - Plan: cyclobenzaprine (FLEXERIL) 5 MG tablet -discussed likely duration of symptoms  -f/u prn if symptoms become worse or do not resolve.  Grier Mitts, MD

## 2018-07-11 ENCOUNTER — Ambulatory Visit: Payer: BC Managed Care – PPO | Admitting: Family Medicine

## 2018-07-12 ENCOUNTER — Other Ambulatory Visit: Payer: Self-pay | Admitting: Family Medicine

## 2018-07-12 ENCOUNTER — Ambulatory Visit: Payer: BC Managed Care – PPO | Admitting: Family Medicine

## 2018-07-12 ENCOUNTER — Encounter: Payer: Self-pay | Admitting: Family Medicine

## 2018-07-12 VITALS — BP 94/68 | HR 80 | Temp 98.4°F | Wt 125.0 lb

## 2018-07-12 DIAGNOSIS — E559 Vitamin D deficiency, unspecified: Secondary | ICD-10-CM

## 2018-07-12 DIAGNOSIS — R5383 Other fatigue: Secondary | ICD-10-CM | POA: Diagnosis not present

## 2018-07-12 LAB — T4, FREE: FREE T4: 0.67 ng/dL (ref 0.60–1.60)

## 2018-07-12 LAB — CBC
HEMATOCRIT: 36.8 % (ref 36.0–46.0)
Hemoglobin: 12.6 g/dL (ref 12.0–15.0)
MCHC: 34.2 g/dL (ref 30.0–36.0)
MCV: 95.4 fl (ref 78.0–100.0)
Platelets: 284 10*3/uL (ref 150.0–400.0)
RBC: 3.85 Mil/uL — ABNORMAL LOW (ref 3.87–5.11)
RDW: 12.3 % (ref 11.5–15.5)
WBC: 3.6 10*3/uL — AB (ref 4.0–10.5)

## 2018-07-12 LAB — VITAMIN D 25 HYDROXY (VIT D DEFICIENCY, FRACTURES): VITD: 23.26 ng/mL — ABNORMAL LOW (ref 30.00–100.00)

## 2018-07-12 LAB — TSH: TSH: 0.64 u[IU]/mL (ref 0.35–4.50)

## 2018-07-12 LAB — VITAMIN B12: VITAMIN B 12: 897 pg/mL (ref 211–911)

## 2018-07-12 MED ORDER — VITAMIN D (ERGOCALCIFEROL) 1.25 MG (50000 UNIT) PO CAPS: 50000.0000 [IU] | ORAL_CAPSULE | ORAL | 0 refills | Status: DC

## 2018-07-12 NOTE — Progress Notes (Signed)
Subjective:    Patient ID: Meredith Lee, female    DOB: 1976-09-03, 42 y.o.   MRN: [id number]  No chief complaint on file.   HPI Patient was seen today for ongoing concern.  Pt endorses fatigue x 4 days.  Pt states in the past she had a similar feeling when she was B12 and vitamin d deficient.  Pt exercises 4x/wk, tries to eat healthy, and drinks plenty of water daily.  Pt even uses the daily water app to help her drink enough.  Pt endorses occasionally feeling like her heart is racing, increased pain/heavy breast pre-menses.  Also notes episode of loose stools last wk.  Pt denies constipation, hair loss.  Past Medical History:  Diagnosis Date  . Amenorrhea   . Depression   . Fibroid   . Heart murmur   . Menstrual cramps   . Ovarian cyst     Allergies  Allergen Reactions  . Flagyl [Metronidazole] Anaphylaxis  . Shellfish Allergy Diarrhea    ROS General: Denies fever, chills, night sweats, changes in weight, changes in appetite   +fatigue HEENT: Denies ear pain, changes in vision, rhinorrhea, sore throat  +HAs CV: Denies CP, SOB, orthopnea  +palpitations Pulm: Denies SOB, cough, wheezing GI: Denies abdominal pain, nausea, vomiting, diarrhea, constipation GU: Denies dysuria, hematuria, frequency, vaginal discharge +breast heaviness, increased pain prior to menses Msk: Denies muscle cramps, joint pains Neuro: Denies weakness, numbness, tingling Skin: Denies rashes, bruising Psych: Denies depression, anxiety, hallucinations     Objective:    Blood pressure 94/68, pulse 80, temperature 98.4 F (36.9 C), temperature source Oral, weight 125 lb (56.7 kg), SpO2 98 %.   Gen. Pleasant, well-nourished, in no distress, normal affect   HEENT: [place]/AT, face symmetric, no scleral icterus, PERRLA, nares patent without drainage.  No thyromegaly. Lungs: no accessory muscle use Cardiovascular: RRR,  no peripheral edema Neuro:  A&Ox3, CN II-XII intact, normal gait Skin:  Warm, no  lesions/ rash   Wt Readings from Last 3 Encounters:  07/12/18 125 lb (56.7 kg)  04/09/18 128 lb 1.6 oz (58.1 kg)  12/19/17 129 lb (58.5 kg)    Lab Results  Component Value Date   WBC 4.2 10/12/2016   HGB 12.5 10/12/2016   HCT 38 10/12/2016   PLT 262 10/12/2016   TSH 0.929 11/23/2017    Assessment/Plan:  Fatigue, unspecified type  -given handout -encouraged to continue exercising  - Plan: CBC (no diff), Vitamin D, 25-hydroxy, TSH, T4, free, Vitamin B12 -will call pt with results.  F/u prn  Grier Mitts, MD

## 2018-07-12 NOTE — Patient Instructions (Signed)

## 2018-07-20 ENCOUNTER — Other Ambulatory Visit: Payer: Self-pay | Admitting: Family Medicine

## 2018-07-20 DIAGNOSIS — Z1231 Encounter for screening mammogram for malignant neoplasm of breast: Secondary | ICD-10-CM

## 2018-08-17 ENCOUNTER — Ambulatory Visit
Admission: RE | Admit: 2018-08-17 | Discharge: 2018-08-17 | Disposition: A | Discharge status: Home / Self Care | Payer: BC Managed Care – PPO | Source: Ambulatory Visit | Attending: Family Medicine | Admitting: Family Medicine

## 2018-08-17 DIAGNOSIS — Z1231 Encounter for screening mammogram for malignant neoplasm of breast: Secondary | ICD-10-CM

## 2018-08-17 IMAGING — MG DIGITAL SCREENING BILATERAL MAMMOGRAM WITH TOMO AND CAD
8 series · 8 of 24 positions shown · non-contrast
Comparison: Previous exam(s).

CLINICAL DATA: Screening.

EXAM:
DIGITAL SCREENING BILATERAL MAMMOGRAM WITH TOMO AND CAD

[R CC synth-2D]
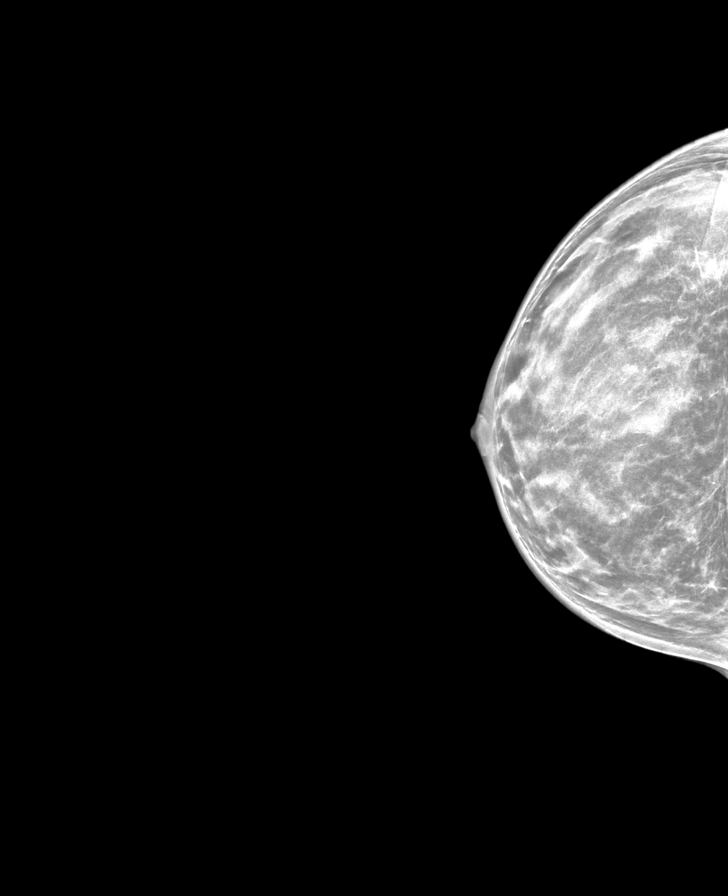

[L MLO synth-2D]
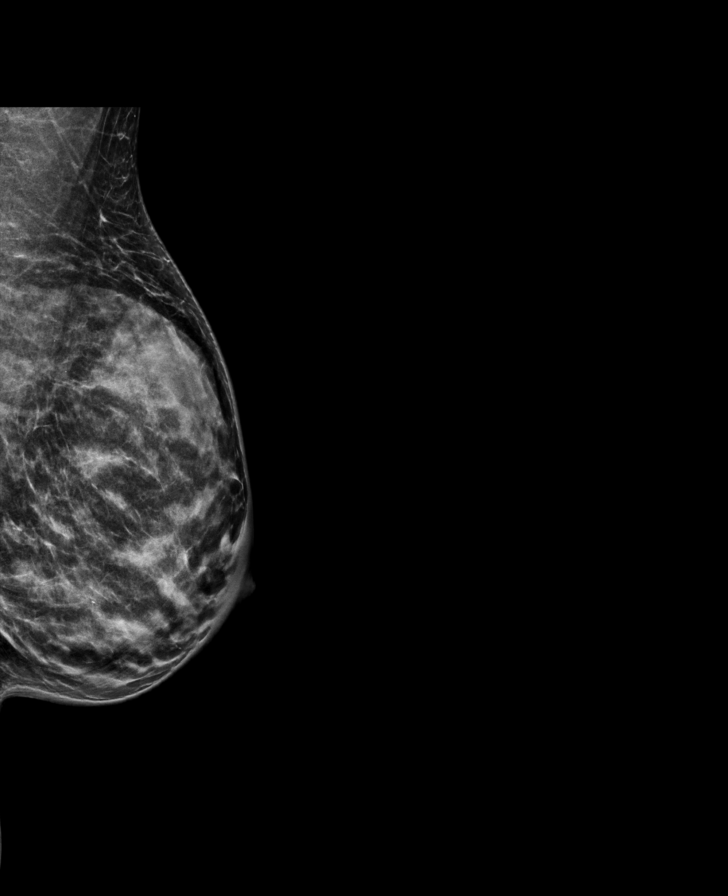

[L CC synth-2D]
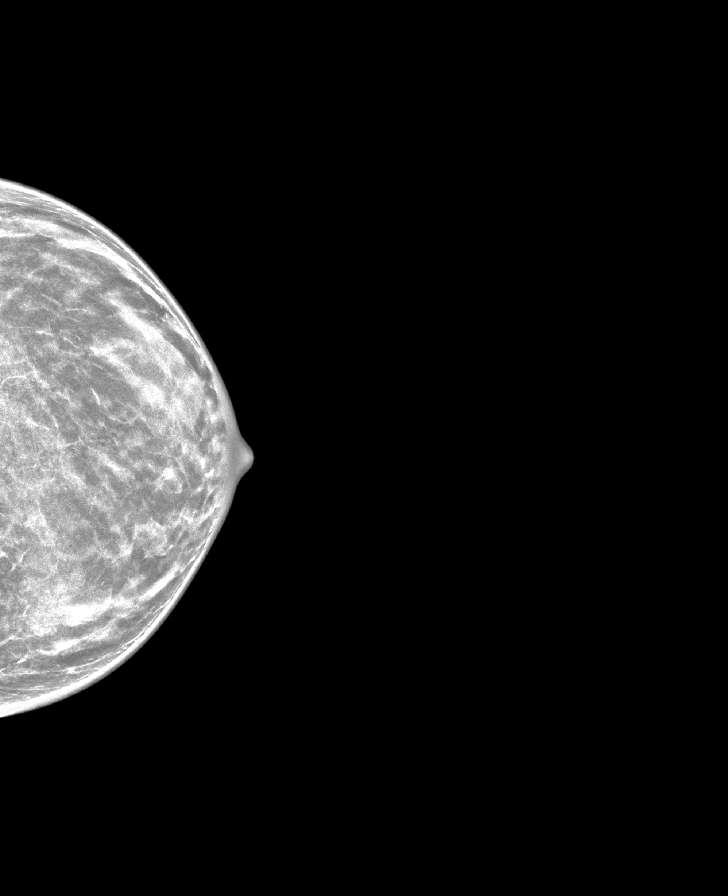

[R MLO synth-2D]
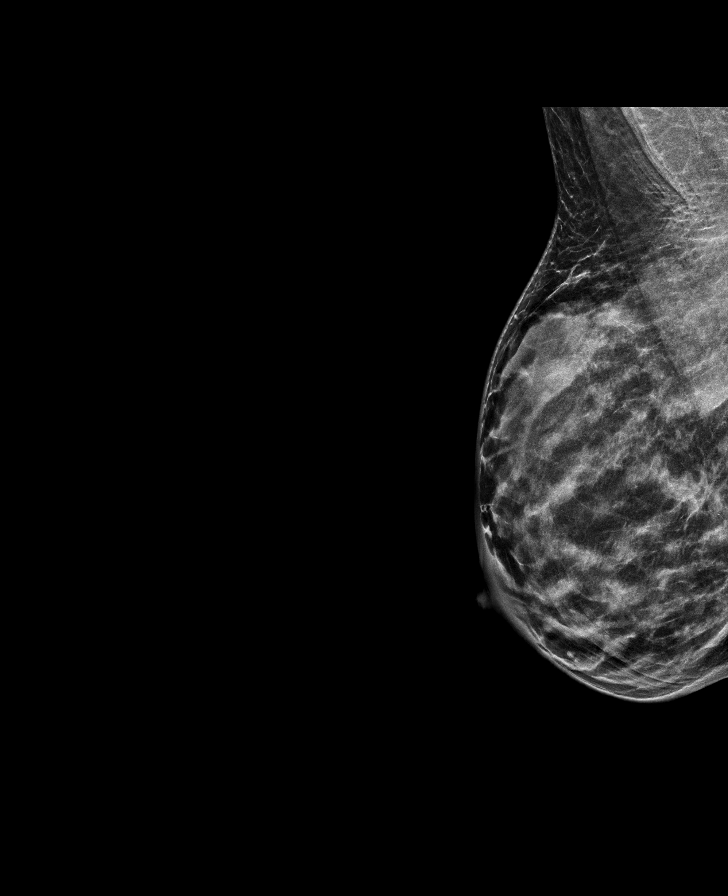

[R CC tomo · tomo slice 29/58.0]
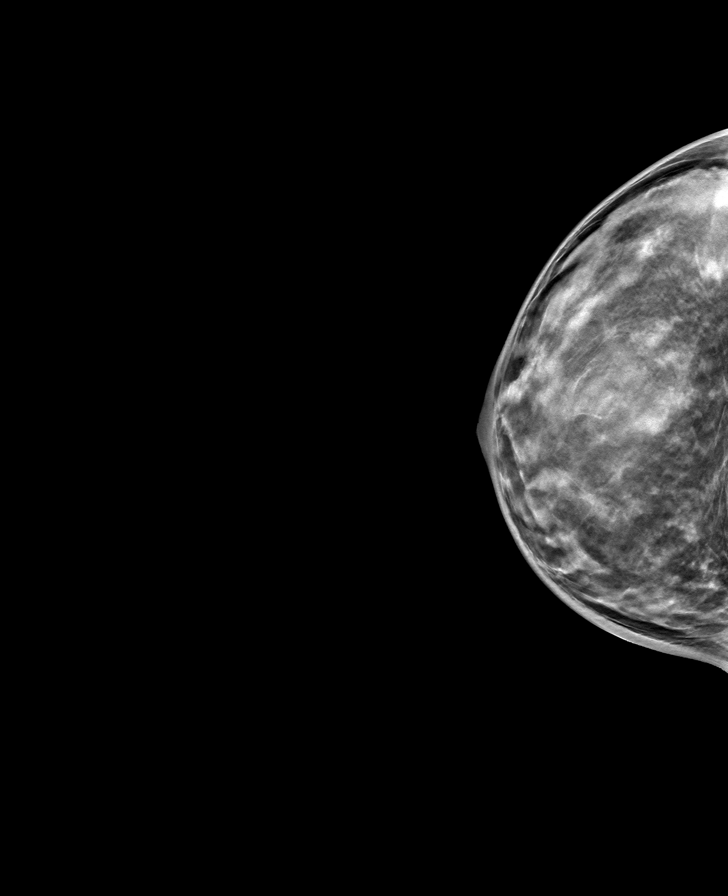

[R MLO tomo · tomo slice 30/59.0]
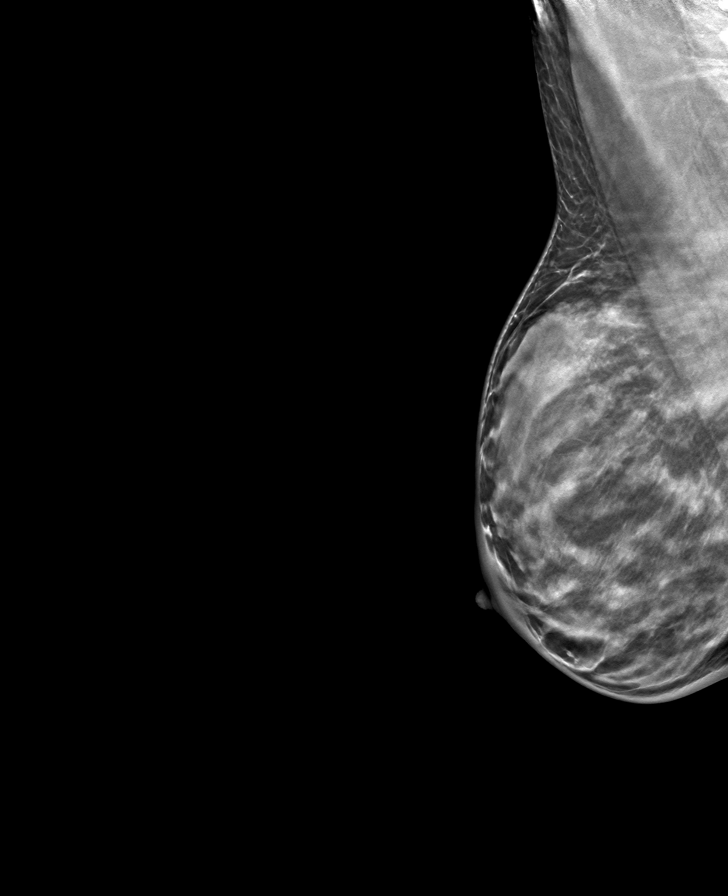

[L CC tomo · tomo slice 24/47.0]
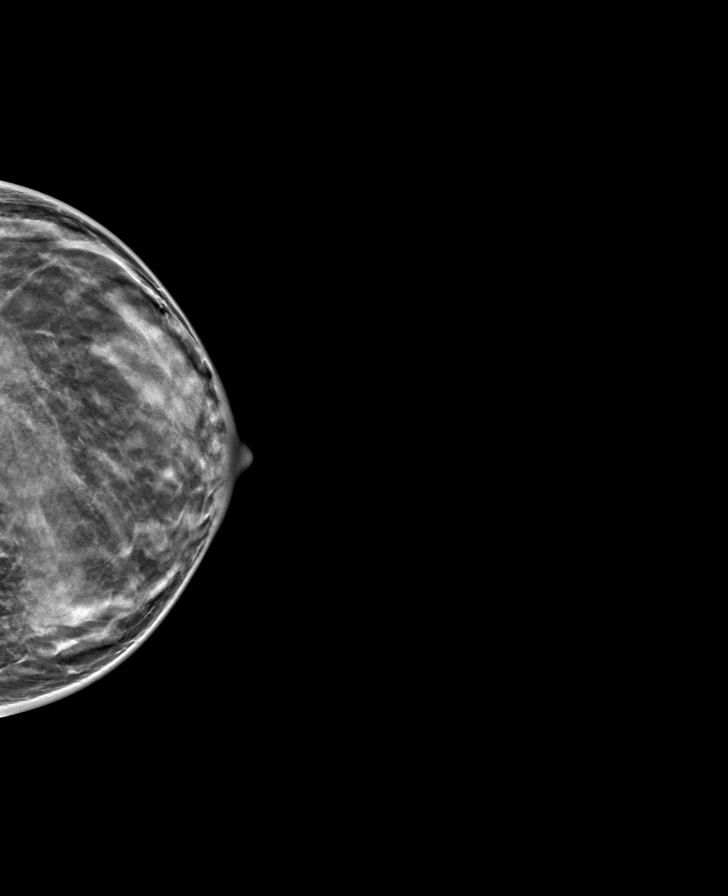

[L MLO tomo · tomo slice 28/55.0]
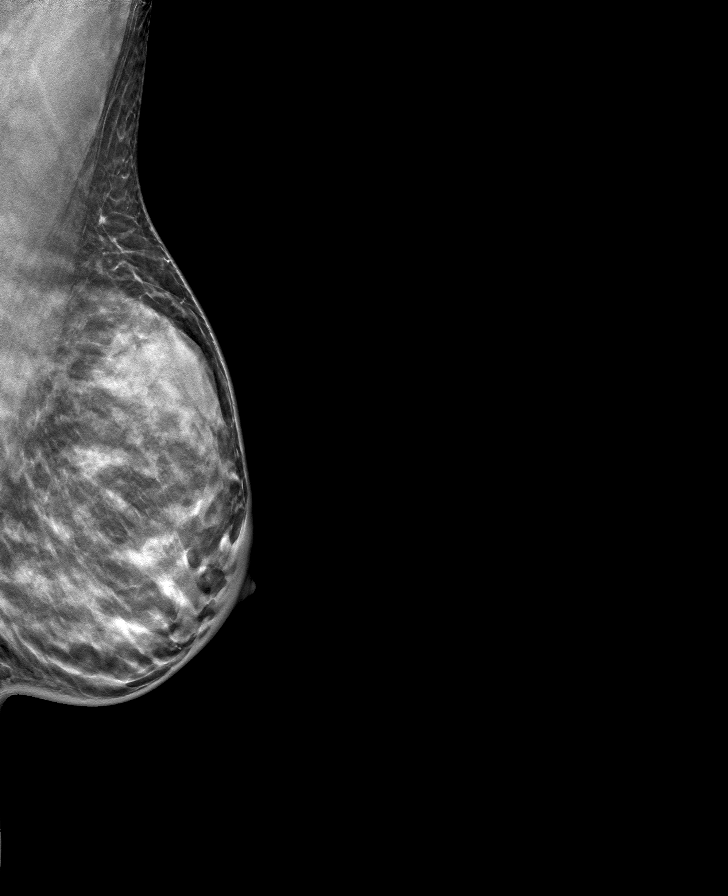

[8 of 24 positions shown; findings below may reference images not displayed]

ACR Breast Density Category c: The breast tissue is heterogeneously
dense, which may obscure small masses.
FINDINGS: There are no findings suspicious for malignancy. Images were
processed with CAD.
IMPRESSION: No mammographic evidence of malignancy. A result letter of this
screening mammogram will be mailed directly to the patient.

RECOMMENDATION:
Screening mammogram in one year. (Code:[5V])

BI-RADS CATEGORY  1: Negative.

## 2018-09-30 ENCOUNTER — Other Ambulatory Visit: Payer: Self-pay | Admitting: Family Medicine

## 2018-09-30 DIAGNOSIS — E559 Vitamin D deficiency, unspecified: Secondary | ICD-10-CM

## 2019-06-12 ENCOUNTER — Other Ambulatory Visit: Payer: Self-pay

## 2019-06-13 ENCOUNTER — Encounter: Payer: Self-pay | Admitting: Obstetrics and Gynecology

## 2019-06-13 ENCOUNTER — Other Ambulatory Visit (HOSPITAL_COMMUNITY)
Admission: RE | Admit: 2019-06-13 | Discharge: 2019-06-13 | Disposition: A | Discharge status: Home / Self Care | Payer: BC Managed Care – PPO | Source: Ambulatory Visit | Attending: Obstetrics and Gynecology | Admitting: Obstetrics and Gynecology

## 2019-06-13 ENCOUNTER — Ambulatory Visit: Payer: BC Managed Care – PPO | Admitting: Obstetrics and Gynecology

## 2019-06-13 VITALS — BP 100/68 | HR 64 | Temp 97.6°F | Ht 63.0 in | Wt 128.6 lb

## 2019-06-13 DIAGNOSIS — Z803 Family history of malignant neoplasm of breast: Secondary | ICD-10-CM

## 2019-06-13 DIAGNOSIS — N939 Abnormal uterine and vaginal bleeding, unspecified: Secondary | ICD-10-CM

## 2019-06-13 DIAGNOSIS — Z124 Encounter for screening for malignant neoplasm of cervix: Secondary | ICD-10-CM | POA: Insufficient documentation

## 2019-06-13 DIAGNOSIS — D259 Leiomyoma of uterus, unspecified: Secondary | ICD-10-CM

## 2019-06-13 DIAGNOSIS — N946 Dysmenorrhea, unspecified: Secondary | ICD-10-CM | POA: Diagnosis not present

## 2019-06-13 DIAGNOSIS — Z01419 Encounter for gynecological examination (general) (routine) without abnormal findings: Secondary | ICD-10-CM

## 2019-06-13 DIAGNOSIS — Z833 Family history of diabetes mellitus: Secondary | ICD-10-CM

## 2019-06-13 DIAGNOSIS — E559 Vitamin D deficiency, unspecified: Secondary | ICD-10-CM

## 2019-06-13 DIAGNOSIS — Z0000 Encounter for general adult medical examination without abnormal findings: Secondary | ICD-10-CM

## 2019-06-13 MED ORDER — MEDROXYPROGESTERONE ACETATE 5 MG PO TABS: ORAL_TABLET | ORAL | 1 refills | Status: DC

## 2019-06-13 MED ORDER — NAPROXEN SODIUM 550 MG PO TABS: 550.0000 mg | ORAL_TABLET | Freq: Two times a day (BID) | ORAL | 2 refills | Status: DC

## 2019-06-13 NOTE — Progress Notes (Signed)
43 y.o. G0P0000 Single Black or African American Not Hispanic or Latino female here for annual exam.   Last year she was evaluated for AUB, c/w anovulatory bleeding. Ultrasound with several small fibroids noted. Cystic structure next to the endometrium, suspected scar tissue from prior myomectomies. Normal TSH and prolactin. She declined OCP's and the mirena IUD, she was given a script for cyclic provera.   She can have cycle once a month or skip 2 cycles. Cramps varies from cycle to cycle. This last month was severe. Taking OTC medication, not always helping. She is also getting premenstrual cramps recently.  Period Duration (Days): 5 days Period Pattern: (!) Irregular Menstrual Flow: Moderate Menstrual Control: Maxi pad Menstrual Control Change Freq (Hours): changes pad every 4-6 hours Dysmenorrhea: (!) Severe Dysmenorrhea Symptoms: Cramping  Patient's last menstrual period was 06/10/2019 (exact date).          Sexually active: No.  The current method of family planning is none.    Exercising: Yes.    walking, strength training Smoker:  no  Health Maintenance: Pap:  12/08/2017 ASCUS POS HPV,  History of abnormal Pap:  Yes, colpo 12/19/2017 negative MMG: 08/17/18  Colonoscopy:  none BMD:   none TDaP:  Unsure, thinks UTD Gardasil: n/a   reports that she has quit smoking. Her smoking use included cigars. She has never used smokeless tobacco. She reports current alcohol use. She reports that she does not use drugs. Rare ETOH. She is teaching at [employer], Writing and [place]  Past Medical History:  Diagnosis Date  . Amenorrhea   . Depression   . Fibroid   . Heart murmur   . Menstrual cramps   . Ovarian cyst     Past Surgical History:  Procedure Laterality Date  . MYOMECTOMY  2012    Current Outpatient Medications  Medication Sig Dispense Refill  . Prenatal Multivit-Min-Fe-FA (PRE-NATAL PO) Take by mouth.    Marland Kitchen VITAMIN D PO Take by mouth.     No current facility-administered  medications for this visit.     Family History  Problem Relation Age of Onset  . Fibroids Mother   . Diabetes Father   . Fibroids Sister   . Colon cancer Maternal Grandmother   . Lung cancer Maternal Grandmother   . Cancer Maternal Grandmother   . Cancer Maternal Grandfather   . Breast cancer Paternal Grandmother   . Cancer Paternal Grandmother   . Breast cancer Paternal Aunt   PAunt with breast cancer ~66, PGM with breast cancer, not sure how old.   Review of Systems  Constitutional: Negative.   HENT: Negative.   Eyes:       Nipple sensitivity  Respiratory: Negative.   Cardiovascular: Negative.   Gastrointestinal: Negative.   Endocrine: Negative.   Genitourinary: Positive for menstrual problem and vaginal bleeding.  Musculoskeletal: Negative.   Skin: Negative.   Allergic/Immunologic: Negative.   Neurological: Negative.   Hematological: Negative.   Psychiatric/Behavioral: Negative.     Exam:   BP 100/68 (BP Location: Right Arm, Patient Position: Sitting, Cuff Size: Normal)   Pulse 64   Temp 97.6 F (36.4 C) (Skin)   Ht 5\' 3"  (1.6 m)   Wt 128 lb 9.6 oz (58.3 kg)   LMP 06/10/2019 (Exact Date)   BMI 22.78 kg/m   Weight change: @WEIGHTCHANGE @ Height:   Height: 5\' 3"  (160 cm)  Ht Readings from Last 3 Encounters:  06/13/19 5\' 3"  (1.6 m)  04/09/18 5' 3.25" (1.607 m)  11/23/17 5'  3.25" (1.607 m)    General appearance: alert, cooperative and appears stated age Head: Normocephalic, without obvious abnormality, atraumatic Neck: no adenopathy, supple, symmetrical, trachea midline and thyroid normal to inspection and palpation Lungs: clear to auscultation bilaterally Cardiovascular: regular rate and rhythm Breasts: normal appearance, no masses or tenderness Abdomen: soft, non-tender; non distended,  no masses,  no organomegaly Extremities: extremities normal, atraumatic, no cyanosis or edema Skin: Skin color, texture, turgor normal. No rashes or lesions Lymph nodes:  Cervical, supraclavicular, and axillary nodes normal. No abnormal inguinal nodes palpated Neurologic: Grossly normal   Pelvic: External genitalia:  no lesions              Urethra:  normal appearing urethra with no masses, tenderness or lesions              Bartholins and Skenes: normal                 Vagina: normal appearing vagina with normal color and discharge, no lesions              Cervix: no lesions               Bimanual Exam:  Uterus:  slightly enlarged, irregularly shaped uterus, anteverted, not tender              Adnexa: no mass, fullness, tenderness               Rectovaginal: Confirms               Anus:  normal sphincter tone, no lesions  Chaperone was present for exam.  A:  Well Woman with normal exam  FH of DM  Vit D  FH of breast cancer, she will get more details. Discussed possible referral to Genetics.  Irregular cycles, intermittent oligomenorrhea.    Episodes of severe dysmenorrhea    P:   Pap with hpv  Mammogram  Discussed breast self exam  Discussed calcium and vit D intake  Screening labs, HgbA1C, TSH, vit D  Script for cyclic provera and anaprox sent  Discussed cycle changes that she should call with.  Information on the mirena IUD given  If her cramps continue to be severe will have her return for a f/u ultrasound.

## 2019-06-13 NOTE — Patient Instructions (Signed)

## 2019-06-14 LAB — COMPREHENSIVE METABOLIC PANEL
ALT: 12 IU/L (ref 0–32)
AST: 15 IU/L (ref 0–40)
Albumin/Globulin Ratio: 1.7 (ref 1.2–2.2)
Albumin: 4.9 g/dL — ABNORMAL HIGH (ref 3.8–4.8)
Alkaline Phosphatase: 72 IU/L (ref 39–117)
BUN/Creatinine Ratio: 13 (ref 9–23)
BUN: 7 mg/dL (ref 6–24)
Bilirubin Total: 0.3 mg/dL (ref 0.0–1.2)
CO2: 25 mmol/L (ref 20–29)
Calcium: 9.6 mg/dL (ref 8.7–10.2)
Chloride: 99 mmol/L (ref 96–106)
Creatinine, Ser: 0.55 mg/dL — ABNORMAL LOW (ref 0.57–1.00)
GFR calc Af Amer: 136 mL/min/{1.73_m2} (ref >59)
GFR calc non Af Amer: 118 mL/min/{1.73_m2} (ref >59)
Globulin, Total: 2.9 g/dL (ref 1.5–4.5)
Glucose: 79 mg/dL (ref 65–99)
Potassium: 4.1 mmol/L (ref 3.5–5.2)
Sodium: 141 mmol/L (ref 134–144)
Total Protein: 7.8 g/dL (ref 6.0–8.5)

## 2019-06-14 LAB — CBC
Hematocrit: 38.2 % (ref 34.0–46.6)
Hemoglobin: 12.9 g/dL (ref 11.1–15.9)
MCH: 32.7 pg (ref 26.6–33.0)
MCHC: 33.8 g/dL (ref 31.5–35.7)
MCV: 97 fL (ref 79–97)
Platelets: 247 10*3/uL (ref 150–450)
RBC: 3.94 x10E6/uL (ref 3.77–5.28)
RDW: 11.4 % — ABNORMAL LOW (ref 11.7–15.4)
WBC: 3.5 10*3/uL (ref 3.4–10.8)

## 2019-06-14 LAB — LIPID PANEL
Chol/HDL Ratio: 3.2 ratio (ref 0.0–4.4)
Cholesterol, Total: 227 mg/dL — ABNORMAL HIGH (ref 100–199)
HDL: 70 mg/dL (ref >39)
LDL Calculated: 140 mg/dL — ABNORMAL HIGH (ref 0–99)
Triglycerides: 84 mg/dL (ref 0–149)
VLDL Cholesterol Cal: 17 mg/dL (ref 5–40)

## 2019-06-14 LAB — TSH: TSH: 0.913 u[IU]/mL (ref 0.450–4.500)

## 2019-06-14 LAB — HEMOGLOBIN A1C
Est. average glucose Bld gHb Est-mCnc: 88 mg/dL
Hgb A1c MFr Bld: 4.7 % — ABNORMAL LOW (ref 4.8–5.6)

## 2019-06-14 LAB — VITAMIN D 25 HYDROXY (VIT D DEFICIENCY, FRACTURES): Vit D, 25-Hydroxy: 40.8 ng/mL (ref 30.0–100.0)

## 2019-06-17 ENCOUNTER — Other Ambulatory Visit: Payer: Self-pay

## 2019-06-17 DIAGNOSIS — Z803 Family history of malignant neoplasm of breast: Secondary | ICD-10-CM

## 2019-06-18 LAB — CYTOLOGY - PAP
Diagnosis: NEGATIVE
HPV: NOT DETECTED

## 2019-07-05 ENCOUNTER — Other Ambulatory Visit: Payer: Self-pay | Admitting: Obstetrics and Gynecology

## 2019-07-05 NOTE — Telephone Encounter (Signed)
Medication refill request: Provera Last AEX:  06/13/2019 JJ Next AEX: 06/27/2020 Last MMG (if hormonal medication request): Scheduled for 08/18/2019 Refill authorized: Pending authorization. #[id number] with 1 refill if appropriate. Please advise.

## 2019-07-15 ENCOUNTER — Other Ambulatory Visit: Payer: Self-pay | Admitting: Obstetrics and Gynecology

## 2019-07-22 ENCOUNTER — Telehealth: Payer: Self-pay | Admitting: Genetic Counselor

## 2019-07-22 ENCOUNTER — Other Ambulatory Visit: Payer: Self-pay

## 2019-07-22 NOTE — Telephone Encounter (Signed)
Ms. Kenison returned my call to schedule a genetic counseling appt on 9/16 at 9am. She declined a webex visit. She has been made aware to arrive 15 minutes early.

## 2019-07-22 NOTE — Progress Notes (Signed)
GYNECOLOGY  VISIT   HPI: 43 y.o.   Single Black or African American Not Hispanic or Latino  female   G0P0000 with Patient's last menstrual period was 07/10/2019.   here for for irregular bleeding. Patient states that her period started 07/10/19 and ended 07/20/19. She then started spotting 07/24/19 with cramps. 07/20/19 patient had severe cramps with no bleeding.  PMP was 06/10/19.  The patient was evaluated last year for abnormal bleeding c/w anovulatory cycle. Normal TSH, prolactin, ultrasound with several small myomas. She was given a script for cyclic provera. At the time of her annual exam last month she reported cycles q 1-3 months x 5 days with moderate flow and cramps (severe at times). TSH from last month was normal.   GYNECOLOGIC HISTORY: Patient's last menstrual period was 07/10/2019. Contraception: not sexually active Menopausal hormone therapy: no        OB History    Gravida  0   Para  0   Term  0   Preterm  0   AB  0   Living  0     SAB  0   TAB  0   Ectopic  0   Multiple  0   Live Births  0              Patient Active Problem List   Diagnosis Date Noted  . Family history of breast cancer   . Family history of colon cancer     Past Medical History:  Diagnosis Date  . Amenorrhea   . Depression   . Family history of breast cancer   . Family history of colon cancer   . Fibroid   . Heart murmur   . Menstrual cramps   . Ovarian cyst     Past Surgical History:  Procedure Laterality Date  . MYOMECTOMY  2012  Abdominal myomectomy.   Current Outpatient Medications  Medication Sig Dispense Refill  . MAGNESIUM PO Take 1 tablet by mouth daily.    . naproxen sodium (ANAPROX DS) 550 MG tablet Take 1 tablet (550 mg total) by mouth 2 (two) times daily with a meal. Take as needed for cramps 30 tablet 2  . Prenatal Multivit-Min-Fe-FA (PRE-NATAL PO) Take by mouth.    Marland Kitchen VITAMIN D PO Take by mouth.    . medroxyPROGESTERone (PROVERA) 5 MG tablet TAKE ONE  TABLET BY MOUTH EVERY DAY X 5 DAYS EVERY OTHER MONTH IF NO SPONTANEOUS MENSES. (Patient not taking: Reported on 07/25/2019) 15 tablet 1   No current facility-administered medications for this visit.      ALLERGIES: Flagyl [metronidazole] and Shellfish allergy  Family History  Problem Relation Age of Onset  . Fibroids Mother   . Diabetes Father   . Fibroids Sister   . Colon cancer Maternal Grandmother   . Lung cancer Maternal Grandmother   . Breast cancer Maternal Grandmother        dx <50  . Cancer Maternal Grandfather   . Breast cancer Paternal Grandmother 34  . Heart attack Paternal Grandmother   . Breast cancer Paternal Aunt 67       recurrance at 58  . Brain cancer Paternal Aunt   . Obesity Maternal Aunt   . Diabetes Maternal Aunt   . Heart failure Maternal Aunt   . Heart disease Paternal Uncle     Social History   Socioeconomic History  . Marital status: Single    Spouse name: Not on file  . Number of children:  Not on file  . Years of education: Not on file  . Highest education level: Not on file  Occupational History  . Not on file  Social Needs  . Financial resource strain: Not on file  . Food insecurity    Worry: Not on file    Inability: Not on file  . Transportation needs    Medical: Not on file    Non-medical: Not on file  Tobacco Use  . Smoking status: Former Smoker    Types: Cigars  . Smokeless tobacco: Never Used  Substance and Sexual Activity  . Alcohol use: Yes    Comment: rarely  . Drug use: No  . Sexual activity: Not Currently    Birth control/protection: None    Comment: female partner  Lifestyle  . Physical activity    Days per week: Not on file    Minutes per session: Not on file  . Stress: Not on file  Relationships  . Social Herbalist on phone: Not on file    Gets together: Not on file    Attends religious service: Not on file    Active member of club or organization: Not on file    Attends meetings of clubs or  organizations: Not on file    Relationship status: Not on file  . Intimate partner violence    Fear of current or ex partner: Not on file    Emotionally abused: Not on file    Physically abused: Not on file    Forced sexual activity: Not on file  Other Topics Concern  . Not on file  Social History Narrative  . Not on file    Review of Systems  Constitutional: Negative.   HENT: Negative.   Eyes: Negative.   Respiratory: Negative.   Cardiovascular: Negative.   Gastrointestinal: Negative.   Genitourinary:       Spotting  Musculoskeletal: Negative.   Skin: Negative.   Neurological: Negative.   Endo/Heme/Allergies: Negative.   Psychiatric/Behavioral: Negative.     PHYSICAL EXAMINATION:    BP 114/60 (BP Location: Right Arm, Patient Position: Sitting, Cuff Size: Normal)   Pulse 80   Temp 97.7 F (36.5 C) (Temporal)   Resp 12   Wt 128 lb 3.2 oz (58.2 kg)   LMP 07/10/2019   BMI 22.71 kg/m     General appearance: alert, cooperative and appears stated age Neck: no adenopathy, supple, symmetrical, trachea midline and thyroid normal to inspection and palpation. Top normal sized.   Pelvic: External genitalia:  no lesions              Urethra:  normal appearing urethra with no masses, tenderness or lesions              Bartholins and Skenes: normal                 Vagina: normal appearing vagina with normal color and discharge, no lesions              Cervix: cervical motion tenderness and no lesions              Bimanual Exam:  Uterus:  anteverted, mobile, top normal sized, mildly tender.               Adnexa: no mass, fullness, tenderness                Chaperone was present for exam.  ASSESSMENT AUB Severe cramps, getting worse Fibroid uterus, h/o abdominal  myomectomy.  Elevated risk of breast cancer. Tyrer Cuzik risk is 24.6%    PLAN We have discussed medical management. Given her elevated risk of breast cancer will avoid OCP's. Declines mirena IUD. She desires  definitive treatment. Return for ultrasound and endometrial biopsy Discussed total laparoscopic hysterectomy, bilateral salpingectomy, possible oophorectomy. Reviewed with her prior risk of abdominal myomectomy she could have significant scar tissue which increases the risk of needing a laparotomy  Genetic testing is pending, will have results prior to surgery. Once these are back will discuss oophorectomy (wouldn't recommend without + genetic testing). Mammogram in 11/20 Will schedule breast MRI in 5/21 Encourage BSE  An After Visit Summary was printed and given to the patient.

## 2019-07-23 ENCOUNTER — Telehealth: Payer: Self-pay | Admitting: Genetic Counselor

## 2019-07-23 NOTE — Telephone Encounter (Signed)
Called patient regarding upcoming Webex appointment, left a voicemail. This will be considered a walk-in due to no communication to set this up as virtual.

## 2019-07-24 ENCOUNTER — Inpatient Hospital Stay: Payer: BC Managed Care – PPO

## 2019-07-24 ENCOUNTER — Encounter: Payer: Self-pay | Admitting: Genetic Counselor

## 2019-07-24 ENCOUNTER — Other Ambulatory Visit: Payer: Self-pay | Admitting: Genetic Counselor

## 2019-07-24 ENCOUNTER — Other Ambulatory Visit: Payer: Self-pay

## 2019-07-24 ENCOUNTER — Inpatient Hospital Stay: Payer: BC Managed Care – PPO | Attending: Genetic Counselor | Admitting: Genetic Counselor

## 2019-07-24 ENCOUNTER — Other Ambulatory Visit: Payer: Self-pay | Admitting: Family Medicine

## 2019-07-24 DIAGNOSIS — Z803 Family history of malignant neoplasm of breast: Secondary | ICD-10-CM

## 2019-07-24 DIAGNOSIS — Z800 Family history of malignant neoplasm of digestive organs: Secondary | ICD-10-CM | POA: Diagnosis not present

## 2019-07-24 DIAGNOSIS — Z1231 Encounter for screening mammogram for malignant neoplasm of breast: Secondary | ICD-10-CM

## 2019-07-24 NOTE — Progress Notes (Signed)
REFERRING PROVIDER: Salvadore Dom, [address] [place],  [place] [address]  PRIMARY PROVIDER:  Billie Ruddy, MD  PRIMARY REASON FOR VISIT:  1. Family history of breast cancer   2. Family history of colon cancer      HISTORY OF PRESENT ILLNESS:   Meredith Lee, a 43 y.o. female, was seen for a [place] cancer genetics consultation at the request of Dr. Talbert Nan due to a family history of breast and colon cancer.  Meredith Lee presents to clinic today to discuss the possibility of a hereditary predisposition to cancer, genetic testing, and to further clarify her future cancer risks, as well as potential cancer risks for family members.   Meredith Lee is a 43 y.o. female with no personal history of cancer.    CANCER HISTORY:  Oncology History   No history exists.     RISK FACTORS:  Menarche was at age 42.  First live birth at age N/A.  OCP use for approximately <50 years.  Ovaries intact: yes.  Hysterectomy: no.  Menopausal status: premenopausal.  HRT use: 0 years. Colonoscopy: no; not examined. Mammogram within the last year: yes. Number of breast biopsies: 0. Up to date with pelvic exams: yes. Any excessive radiation exposure in the past: no  Past Medical History:  Diagnosis Date   Amenorrhea    Depression    Family history of breast cancer    Family history of colon cancer    Fibroid    Heart murmur    Menstrual cramps    Ovarian cyst     Past Surgical History:  Procedure Laterality Date   MYOMECTOMY  2012    Social History   Socioeconomic History   Marital status: Single    Spouse name: Not on file   Number of children: Not on file   Years of education: Not on file   Highest education level: Not on file  Occupational History   Not on file  Social Needs   Financial resource strain: Not on file   Food insecurity    Worry: Not on file    Inability: Not on file   Transportation needs    Medical: Not on file   Non-medical: Not on file  Tobacco Use   Smoking status: Former Smoker    Types: Cigars   Smokeless tobacco: Never Used  Substance and Sexual Activity   Alcohol use: Yes    Comment: rarely   Drug use: No   Sexual activity: Not Currently    Birth control/protection: None    Comment: female partner  Lifestyle   Physical activity    Days per week: Not on file    Minutes per session: Not on file   Stress: Not on file  Relationships   Social connections    Talks on phone: Not on file    Gets together: Not on file    Attends religious service: Not on file    Active member of club or organization: Not on file    Attends meetings of clubs or organizations: Not on file    Relationship status: Not on file  Other Topics Concern   Not on file  Social History Narrative   Not on file     FAMILY HISTORY:  We obtained a detailed, 4-generation family history.  Significant diagnoses are listed below: Family History  Problem Relation Age of Onset   Fibroids Mother    Diabetes Father    Fibroids Sister  Colon cancer Maternal Grandmother    Lung cancer Maternal Grandmother    Breast cancer Maternal Grandmother        dx <50   Cancer Maternal Grandfather    Breast cancer Paternal Grandmother 30   Heart attack Paternal Grandmother    Breast cancer Paternal Aunt 87       recurrance at 64   Brain cancer Paternal Aunt    Obesity Maternal Aunt    Diabetes Maternal Aunt    Heart failure Maternal Aunt    Heart disease Paternal Uncle     The patient does not have children.  She has a twin sister and another sister who are both cancer free.  Her father is deceased and her mother is living.  The patient's father died of heart disease at 92.  He had a brother and sister.  His sister had breast cancer at 9 and a second breast cancer in her 51's.  She has also been diagnosed with brain cancer.  It is unknown if she has had genetic testing.  His brother's daughter had  genetic testing that is reportedly negative.  The paternal grandparents are deceased.  The grandmother had breast cancer at 5 and died in her 26's from heart disease.  The patient's mother is living and has not had cancer.  She had two sisters and a brother.  One sister died of heart disease.  The maternal grandparents are deceased. The grandmother had breast cancer under 84, and colon cancer and lung cancer over 31.  Ms. Stutsman is unsure of previous family history of genetic testing for hereditary cancer risks. Patient's maternal ancestors are of African American descent, and paternal ancestors are of African American descent. There is no reported Ashkenazi Jewish ancestry. There is no known consanguinity.  GENETIC COUNSELING ASSESSMENT: Meredith Lee is a 43 y.o. female with a family history of breast and colon cancer which is somewhat suggestive of a hereditary cancer syndrome and predisposition to cancer given the young ages of onset of breast cancer in the family. We, therefore, discussed and recommended the following at today's visit.   DISCUSSION: We discussed that 5 - 10% of breast cancer is hereditary, with most cases associated with BRCA mutations.  There are other genes that can be associated with hereditary breast cancer syndromes.  We discussed that based on the family history, her paternal aunt would be the best person to test so that we could inform the family whether there is any concern for a hereditary syndrome.  We can offer genetic testing to the patient, but if negative, she would not be informative for the family.  We discussed that testing is beneficial for several reasons including knowing how to follow individuals after completing their treatment, identifying whether potential treatment options such as PARP inhibitors would be beneficial, and understand if other family members could be at risk for cancer and allow them to undergo genetic testing.   We reviewed the characteristics,  features and inheritance patterns of hereditary cancer syndromes. We also discussed genetic testing, including the appropriate family members to test, the process of testing, insurance coverage and turn-around-time for results. We discussed the implications of a negative, positive, carrier and/or variant of uncertain significant result. We recommended Ms. Dedmon pursue genetic testing for the multi-cancer gene panel. The Multi-Gene Panel offered by [facility] includes sequencing and/or deletion duplication testing of the following 85 genes: AIP, ALK, APC, ATM, AXIN2,BAP1,  BARD1, BLM, BMPR1A, BRCA1, BRCA2, BRIP1, CASR, CDC73, CDH1, CDK4, CDKN1B,  CDKN1C, CDKN2A (p14ARF), CDKN2A (p16INK4a), CEBPA, CHEK2, CTNNA1, DICER1, DIS3L2, EGFR (c.2369C>T, p.Thr790Met variant only), EPCAM (Deletion/duplication testing only), FH, FLCN, GATA2, GPC3, GREM1 (Promoter region deletion/duplication testing only), HOXB13 (c.251G>A, p.Gly84Glu), HRAS, KIT, MAX, MEN1, MET, MITF (c.952G>A, p.Glu318Lys variant only), MLH1, MSH2, MSH3, MSH6, MUTYH, NBN, NF1, NF2, NTHL1, PALB2, PDGFRA, PHOX2B, PMS2, POLD1, POLE, POT1, PRKAR1A, PTCH1, PTEN, RAD50, RAD51C, RAD51D, RB1, RECQL4, RET, RNF43, RUNX1, SDHAF2, SDHA (sequence changes only), SDHB, SDHC, SDHD, SMAD4, SMARCA4, SMARCB1, SMARCE1, STK11, SUFU, TERC, TERT, TMEM127, TP53, TSC1, TSC2, VHL, WRN and WT1.    Based on Ms. Reagle's family history of cancer, she meets medical criteria for genetic testing. Despite that she meets criteria, she may still have an out of pocket cost. We discussed that if her out of pocket cost for testing is over $100, the laboratory will call and confirm whether she wants to proceed with testing.  If the out of pocket cost of testing is less than $100 she will be billed by the genetic testing laboratory.   Based on the patient's family history, a statistical model (Tyrer Cusik) was used to estimate her risk of developing breast cancer. This estimates her lifetime risk of  developing breast cancer to be approximately 24.6%. This estimation does not consider any genetic testing results.  The patient's lifetime breast cancer risk is a preliminary estimate based on available information using one of several models endorsed by the [place] (ACS). The ACS recommends consideration of breast MRI screening as an adjunct to mammography for patients at high risk (defined as 20% or greater lifetime risk).   Ms. Karrer has been determined to be at high risk for breast cancer.  Therefore, we recommend that annual screening with mammography and breast MRI be performed. We discussed that Ms. Zellers should discuss her individual situation with her referring physician and determine a breast cancer screening plan with which they are both comfortable.      PLAN: After considering the risks, benefits, and limitations, Ms. Mallo provided informed consent to pursue genetic testing and the blood sample was sent to [facility] for analysis of the Multi-cancer panel. Results should be available within approximately 2-3 weeks' time, at which point they will be disclosed by telephone to Ms. Urbanczyk, as will any additional recommendations warranted by these results. Ms. Bockrath will receive a summary of her genetic counseling visit and a copy of her results once available. This information will also be available in Epic.   Based on Ms. Brownrigg's family history, we recommended her paternal aunt, who was diagnosed with breast cancer at age 50, have genetic counseling and testing. Ms. Amato will let us know if we can be of any assistance in coordinating genetic counseling and/or testing for this family member.   Lastly, we encouraged Ms. Lobb to remain in contact with cancer genetics annually so that we can continuously update the family history and inform her of any changes in cancer genetics and testing that may be of benefit for this family.   Ms. Quadros questions were  answered to her satisfaction today. Our contact information was provided should additional questions or concerns arise. Thank you for the referral and allowing [place] to share in the care of your patient.   Shiane Wenberg P. Florene Glen, [place], [facility] Licensed, Insurance risk surveyor [email or web address] phone: [phone]  The patient was seen for a total of 60 minutes in face-to-face genetic counseling.  This patient was discussed with Drs. Magrinat, Lindi Adie and/or Burr Medico who agrees with the above.  _______________________________________________________________________ For Office Staff:  Number of people involved in session: 1 Was an Intern/ student involved with case: yes Amy Danelle Earthly

## 2019-07-25 ENCOUNTER — Encounter: Payer: Self-pay | Admitting: Obstetrics and Gynecology

## 2019-07-25 ENCOUNTER — Ambulatory Visit: Payer: BC Managed Care – PPO | Admitting: Obstetrics and Gynecology

## 2019-07-25 ENCOUNTER — Telehealth: Payer: Self-pay | Admitting: Obstetrics and Gynecology

## 2019-07-25 VITALS — BP 114/60 | HR 80 | Temp 97.7°F | Resp 12 | Wt 128.2 lb

## 2019-07-25 DIAGNOSIS — Z9189 Other specified personal risk factors, not elsewhere classified: Secondary | ICD-10-CM | POA: Diagnosis not present

## 2019-07-25 DIAGNOSIS — Z1239 Encounter for other screening for malignant neoplasm of breast: Secondary | ICD-10-CM

## 2019-07-25 DIAGNOSIS — N946 Dysmenorrhea, unspecified: Secondary | ICD-10-CM

## 2019-07-25 DIAGNOSIS — Z1231 Encounter for screening mammogram for malignant neoplasm of breast: Secondary | ICD-10-CM

## 2019-07-25 DIAGNOSIS — N939 Abnormal uterine and vaginal bleeding, unspecified: Secondary | ICD-10-CM | POA: Diagnosis not present

## 2019-07-25 DIAGNOSIS — D259 Leiomyoma of uterus, unspecified: Secondary | ICD-10-CM | POA: Diagnosis not present

## 2019-07-25 DIAGNOSIS — Z803 Family history of malignant neoplasm of breast: Secondary | ICD-10-CM

## 2019-07-25 NOTE — Telephone Encounter (Signed)
Please let Meredith Lee know that I received a copy of her Genetics visit. I know that she has decided to do genetic testing. Prior to the testing her risk of breast cancer is 24.6% (Tyrer Cusik). She should have yearly breast MRI's. I would recommend that she have her MRI 6 months after her mammogram, that way she is having imaging every 6 months.  If she is agreeable, please set her up for a breast MRI in 5/21 (mammogram in 11/20). She should also be doing monthly breast self exams.

## 2019-07-25 NOTE — Telephone Encounter (Signed)
Order placed for bilateral breast MRI w/wo contrast at [facility]. Schedule for 03/2020. Dr. Talbert Nan to discuss with patient while in office today.   Routing to Dr. Rosann Auerbach.   Cc: Lerry Liner  Encounter closed.

## 2019-07-26 ENCOUNTER — Other Ambulatory Visit: Payer: Self-pay

## 2019-07-29 NOTE — Progress Notes (Signed)
GYNECOLOGY  VISIT   HPI: 43 y.o.   Single Black or African American Not Hispanic or Latino  female   G0P0000 with Patient's last menstrual period was 07/10/2019.   here for consult following PUS for AUB, worsening severe dysmenorrhea and a fibroid uterus. She has an elevated risk of breast cancer and is awaiting genetic test results (drawn last week).    GYNECOLOGIC HISTORY: Patient's last menstrual period was 07/10/2019. Contraception:None Menopausal hormone therapy: None        OB History    Gravida  0   Para  0   Term  0   Preterm  0   AB  0   Living  0     SAB  0   TAB  0   Ectopic  0   Multiple  0   Live Births  0              Patient Active Problem List   Diagnosis Date Noted  . Family history of breast cancer   . Family history of colon cancer     Past Medical History:  Diagnosis Date  . Amenorrhea   . Depression   . Family history of breast cancer   . Family history of colon cancer   . Fibroid   . Heart murmur   . Menstrual cramps   . Ovarian cyst     Past Surgical History:  Procedure Laterality Date  . MYOMECTOMY  2012    Current Outpatient Medications  Medication Sig Dispense Refill  . MAGNESIUM PO Take 1 tablet by mouth daily.    . naproxen sodium (ANAPROX DS) 550 MG tablet Take 1 tablet (550 mg total) by mouth 2 (two) times daily with a meal. Take as needed for cramps 30 tablet 2  . Prenatal Multivit-Min-Fe-FA (PRE-NATAL PO) Take by mouth.    Marland Kitchen VITAMIN D PO Take by mouth.    . medroxyPROGESTERone (PROVERA) 5 MG tablet TAKE ONE TABLET BY MOUTH EVERY DAY X 5 DAYS EVERY OTHER MONTH IF NO SPONTANEOUS MENSES. (Patient not taking: Reported on 07/25/2019) 15 tablet 1   No current facility-administered medications for this visit.      ALLERGIES: Flagyl [metronidazole] and Shellfish allergy  Family History  Problem Relation Age of Onset  . Fibroids Mother   . Diabetes Father   . Fibroids Sister   . Colon cancer Maternal Grandmother    . Lung cancer Maternal Grandmother   . Breast cancer Maternal Grandmother        dx <50  . Cancer Maternal Grandfather   . Breast cancer Paternal Grandmother 55  . Heart attack Paternal Grandmother   . Breast cancer Paternal Aunt 55       recurrance at 59  . Brain cancer Paternal Aunt   . Obesity Maternal Aunt   . Diabetes Maternal Aunt   . Heart failure Maternal Aunt   . Heart disease Paternal Uncle     Social History   Socioeconomic History  . Marital status: Single    Spouse name: Not on file  . Number of children: Not on file  . Years of education: Not on file  . Highest education level: Not on file  Occupational History  . Not on file  Social Needs  . Financial resource strain: Not on file  . Food insecurity    Worry: Not on file    Inability: Not on file  . Transportation needs    Medical: Not on file  Non-medical: Not on file  Tobacco Use  . Smoking status: Former Smoker    Types: Cigars  . Smokeless tobacco: Never Used  Substance and Sexual Activity  . Alcohol use: Yes    Comment: rarely  . Drug use: No  . Sexual activity: Not Currently    Birth control/protection: None    Comment: female partner  Lifestyle  . Physical activity    Days per week: Not on file    Minutes per session: Not on file  . Stress: Not on file  Relationships  . Social Herbalist on phone: Not on file    Gets together: Not on file    Attends religious service: Not on file    Active member of club or organization: Not on file    Attends meetings of clubs or organizations: Not on file    Relationship status: Not on file  . Intimate partner violence    Fear of current or ex partner: Not on file    Emotionally abused: Not on file    Physically abused: Not on file    Forced sexual activity: Not on file  Other Topics Concern  . Not on file  Social History Narrative  . Not on file    Review of Systems  Constitutional: Negative.   HENT: Negative.   Eyes:  Negative.   Respiratory: Negative.   Cardiovascular: Negative.   Gastrointestinal: Negative.   Genitourinary: Negative.   Musculoskeletal: Negative.   Skin: Negative.   Neurological: Negative.   Endo/Heme/Allergies: Negative.   Psychiatric/Behavioral: Negative.     PHYSICAL EXAMINATION:    BP 110/60 (BP Location: Right Arm, Patient Position: Sitting, Cuff Size: Normal)   Pulse 80   Temp (!) 97.3 F (36.3 C) (Skin)   Wt 128 lb (58.1 kg)   LMP 07/10/2019   BMI 22.67 kg/m     General appearance: alert, cooperative and appears stated age  Pelvic: External genitalia:  no lesions              Urethra:  normal appearing urethra with no masses, tenderness or lesions              Bartholins and Skenes: normal                 Vagina: normal appearing vagina with normal color and discharge, no lesions              Cervix: no lesions  The risks of endometrial biopsy were reviewed and a consent was obtained.  A speculum was placed in the vagina and the cervix was cleansed with betadine. A tenaculum was placed on the cervix and the cervix was dilated with the mini dilators to a #3 hagar dilator.  The mini-pipelle was placed into the endometrial cavity. The uterus sounded to ~9 cm. The endometrial biopsy was performed, only mucous was obtained. The uterine evaculator was then used, minimal tissue was obtained (as expected, her lining was thin). The cavity had the characteristic gritty texture. The tenaculum and speculum were removed. There were no complications.    Chaperone was present for exam.  Ultrasound images reviewed with the patient. Several small myomas, suspected adenomyosis, normal adnexa bilaterally  ASSESSMENT Fibroid uterus AUB Worsening severe dysmenorrhea Elevated risk of breast cancer, genetic testing is pending.  Cervical stenosis    PLAN Endometrial biopsy done Declines medical management Desires hysterectomy Will plan TLH/BS, possible BO, cystoscopy. Given her  h/o abdominal myomectomy we discussed the slight  chance of needed a laparotomy.     An After Visit Summary was printed and given to the patient.  ~15 minutes face to face time of which over 50% was spent in counseling.

## 2019-07-30 ENCOUNTER — Encounter: Payer: Self-pay | Admitting: Obstetrics and Gynecology

## 2019-07-30 ENCOUNTER — Other Ambulatory Visit: Payer: Self-pay

## 2019-07-30 ENCOUNTER — Ambulatory Visit: Payer: BC Managed Care – PPO | Admitting: Obstetrics and Gynecology

## 2019-07-30 ENCOUNTER — Ambulatory Visit (INDEPENDENT_AMBULATORY_CARE_PROVIDER_SITE_OTHER): Payer: BC Managed Care – PPO

## 2019-07-30 VITALS — BP 110/60 | HR 80 | Temp 97.3°F | Wt 128.0 lb

## 2019-07-30 DIAGNOSIS — N882 Stricture and stenosis of cervix uteri: Secondary | ICD-10-CM | POA: Diagnosis not present

## 2019-07-30 DIAGNOSIS — N946 Dysmenorrhea, unspecified: Secondary | ICD-10-CM | POA: Diagnosis not present

## 2019-07-30 DIAGNOSIS — N939 Abnormal uterine and vaginal bleeding, unspecified: Secondary | ICD-10-CM

## 2019-07-30 DIAGNOSIS — D259 Leiomyoma of uterus, unspecified: Secondary | ICD-10-CM

## 2019-07-30 DIAGNOSIS — Z9189 Other specified personal risk factors, not elsewhere classified: Secondary | ICD-10-CM

## 2019-07-30 NOTE — Patient Instructions (Signed)

## 2019-07-31 ENCOUNTER — Other Ambulatory Visit: Payer: Self-pay | Admitting: Obstetrics and Gynecology

## 2019-08-02 ENCOUNTER — Telehealth: Payer: Self-pay | Admitting: *Deleted

## 2019-08-02 NOTE — Telephone Encounter (Signed)
Call to patient to review surgery date options.  Patient considering planning during holidays due to time off for recovery. She will consider options and call back with decision.

## 2019-08-05 NOTE — Telephone Encounter (Signed)
Call to patient. Surgery information form reviewed in detail with patient and she verbalized understanding. Patient scheduled for pre op on 08-08-2019 at 1630 with Dr. Talbert Nan. Patient agreeable to date and time. Patient aware will have separate pre op at [facility]. Advised Covid presurgical test scheduled for 08-15-2019 at 1005. Advised patient she would need to quarantine after testing until surgery. Patient agreeable. "8 things to do during your quarantine" reviewed with patient and she verbalized understanding. Aware she will receive a copy of information forms at pre op appointment on 08-08-2019.   Routing to provider and will close encounter.   Cc Lamont Snowball, RN

## 2019-08-05 NOTE — Telephone Encounter (Signed)
Patient calls requesting to move up plan for surgery. Would like to proceed with first possible date. Agreeable to 08-19-19. Will schedule and call patient back.

## 2019-08-05 NOTE — Telephone Encounter (Signed)
Spoke with patient regarding benefit for surgery. Patient understood and agreeable. Patient has confirmed and is ready to proceed with scheduling on 08/19/2019. Patient aware this is professional benefit only. Patient aware will be contacted by hospital for separate benefits. Forwarding to Conservation officer, historic buildings for scheduling.  Routing to Lamont Snowball, RN

## 2019-08-08 ENCOUNTER — Encounter: Payer: Self-pay | Admitting: Obstetrics and Gynecology

## 2019-08-08 ENCOUNTER — Other Ambulatory Visit: Payer: Self-pay

## 2019-08-08 ENCOUNTER — Ambulatory Visit: Payer: BC Managed Care – PPO | Admitting: Obstetrics and Gynecology

## 2019-08-08 VITALS — BP 106/64 | HR 84 | Temp 97.1°F | Wt 130.6 lb

## 2019-08-08 DIAGNOSIS — N946 Dysmenorrhea, unspecified: Secondary | ICD-10-CM

## 2019-08-08 DIAGNOSIS — D259 Leiomyoma of uterus, unspecified: Secondary | ICD-10-CM

## 2019-08-08 DIAGNOSIS — Z803 Family history of malignant neoplasm of breast: Secondary | ICD-10-CM

## 2019-08-08 DIAGNOSIS — N939 Abnormal uterine and vaginal bleeding, unspecified: Secondary | ICD-10-CM

## 2019-08-08 NOTE — Progress Notes (Signed)
GYNECOLOGY  VISIT   HPI: 43 y.o.   Single Black or African American Not Hispanic or Latino  female   G0P0000 with Patient's last menstrual period was 07/10/2019.   here for a preoperative exam. She has a symptomatic fibroid uterus with AUB and worsening severe dysmenorrhea. Ultrasound with several small myomas, suspected adenomyosis.  Endometrial biopsy is benign.  Not anemic, normal TSH. Negative pap and negative HPV.   She does have a FH of breast and colon cancer. Genetic testing is currently pending. Prior to testing Tobey Bride [place] risk of breast cancer is 24.6%.  GYNECOLOGIC HISTORY: Patient's last menstrual period was 07/10/2019. Contraception:abstains Menopausal hormone therapy: NA        OB History    Gravida  0   Para  0   Term  0   Preterm  0   AB  0   Living  0     SAB  0   TAB  0   Ectopic  0   Multiple  0   Live Births  0              Patient Active Problem List   Diagnosis Date Noted  . Family history of breast cancer   . Family history of colon cancer     Past Medical History:  Diagnosis Date  . Amenorrhea   . Depression   . Family history of breast cancer   . Family history of colon cancer   . Fibroid   . Heart murmur   . Menstrual cramps   . Ovarian cyst     Past Surgical History:  Procedure Laterality Date  . MYOMECTOMY  2012    Current Outpatient Medications  Medication Sig Dispense Refill  . MAGNESIUM PO Take 1 tablet by mouth daily.    . Prenatal Multivit-Min-Fe-FA (PRE-NATAL PO) Take by mouth.    Marland Kitchen VITAMIN D PO Take by mouth.     No current facility-administered medications for this visit.      ALLERGIES: Flagyl [metronidazole] and Shellfish allergy  Family History  Problem Relation Age of Onset  . Fibroids Mother   . Diabetes Father   . Fibroids Sister   . Colon cancer Maternal Grandmother   . Lung cancer Maternal Grandmother   . Breast cancer Maternal Grandmother        dx <50  . Cancer Maternal Grandfather    . Breast cancer Paternal Grandmother 87  . Heart attack Paternal Grandmother   . Breast cancer Paternal Aunt 21       recurrance at 68  . Brain cancer Paternal Aunt   . Obesity Maternal Aunt   . Diabetes Maternal Aunt   . Heart failure Maternal Aunt   . Heart disease Paternal Uncle     Social History   Socioeconomic History  . Marital status: Single    Spouse name: Not on file  . Number of children: Not on file  . Years of education: Not on file  . Highest education level: Not on file  Occupational History  . Not on file  Social Needs  . Financial resource strain: Not on file  . Food insecurity    Worry: Not on file    Inability: Not on file  . Transportation needs    Medical: Not on file    Non-medical: Not on file  Tobacco Use  . Smoking status: Former Smoker    Types: Cigars  . Smokeless tobacco: Never Used  Substance and Sexual Activity  .  Alcohol use: Yes    Comment: rarely  . Drug use: No  . Sexual activity: Not Currently    Birth control/protection: None    Comment: female partner  Lifestyle  . Physical activity    Days per week: Not on file    Minutes per session: Not on file  . Stress: Not on file  Relationships  . Social Herbalist on phone: Not on file    Gets together: Not on file    Attends religious service: Not on file    Active member of club or organization: Not on file    Attends meetings of clubs or organizations: Not on file    Relationship status: Not on file  . Intimate partner violence    Fear of current or ex partner: Not on file    Emotionally abused: Not on file    Physically abused: Not on file    Forced sexual activity: Not on file  Other Topics Concern  . Not on file  Social History Narrative  . Not on file    Review of Systems  Constitutional: Negative.   HENT: Negative.   Eyes: Negative.   Respiratory: Negative.   Cardiovascular: Negative.   Gastrointestinal: Negative.   Genitourinary: Negative.    Musculoskeletal: Negative.   Skin: Negative.   Neurological: Negative.   Endo/Heme/Allergies: Negative.   Psychiatric/Behavioral: Negative.     PHYSICAL EXAMINATION:    BP 106/64 (BP Location: Right Arm, Patient Position: Sitting, Cuff Size: Normal)   Pulse 84   Temp (!) 97.1 F (36.2 C) (Skin)   Wt 130 lb 9.6 oz (59.2 kg)   LMP 07/10/2019   BMI 23.13 kg/m     General appearance: alert, cooperative and appears stated age Neck: no adenopathy, supple, symmetrical, trachea midline and thyroid normal to inspection and palpation Heart: regular rate and rhythm Lungs: CTAB Abdomen: soft, non-tender; bowel sounds normal; no masses,  no organomegaly Extremities: normal, atraumatic, no cyanosis Skin: normal color, texture and turgor, no rashes or lesions Lymph: normal cervical supraclavicular and inguinal nodes Neurologic: grossly normal   ASSESSMENT Fibroid uterus Abnormal uterine bleeding Worsening severe dysmenorrhea Suspected adenomyosis Elevated risk of breast cancer, genetic testing is pending (should be back within the week)    PLAN Discussed total laparoscopic hysterectomy, bilateral salpingectomies, possible bilateral oophorectomies and cystoscopy. Reviewed the risks of the procedure, including infection, bleeding, damage to bowel/badder/vessels/ureters.  Discussed the possible need for laparotomy. Discussed post operative recovery and risk of cuff dehiscence. All of her questions were answered

## 2019-08-12 ENCOUNTER — Telehealth: Payer: Self-pay | Admitting: Genetic Counselor

## 2019-08-12 ENCOUNTER — Ambulatory Visit: Payer: Self-pay | Admitting: Genetic Counselor

## 2019-08-12 DIAGNOSIS — Z1379 Encounter for other screening for genetic and chromosomal anomalies: Secondary | ICD-10-CM | POA: Insufficient documentation

## 2019-08-12 NOTE — H&P (Signed)
Progress Notes by Salvadore Dom, MD at 08/08/2019 4:30 PM Author: Salvadore Dom, MD Author Type: Physician Filed: 08/08/2019 5:04 PM  Note Status: Signed Cosign: Cosign Not Required Encounter Date: 08/08/2019  Editor: Salvadore Dom, MD (Physician)  Prior Versions: 1. Sprague, Harley Hallmark, RN (Registered Nurse) at 08/08/2019 4:42 PM - Sign when Signing Visit   2. Salvadore Dom, MD (Physician) at 08/08/2019 4:00 PM - Sign when Signing Visit   3. Sprague, Harley Hallmark, RN (Registered Nurse) at 08/08/2019 8:27 AM - Sign when Signing Visit    GYNECOLOGY  VISIT   HPI: 43 y.o.   Single Black or African American Not Hispanic or Latino  female   G0P0000 with Patient's last menstrual period was 07/10/2019.   here for a preoperative exam. She has a symptomatic fibroid uterus with AUB and worsening severe dysmenorrhea. Ultrasound with several small myomas, suspected adenomyosis.  Endometrial biopsy is benign.  Not anemic, normal TSH. Negative pap and negative HPV.   She does have a FH of breast and colon cancer. Genetic testing is currently pending. Prior to testing Tobey Bride [place] risk of breast cancer is 24.6%.  GYNECOLOGIC HISTORY: Patient's last menstrual period was 07/10/2019. Contraception:abstains Menopausal hormone therapy: NA                OB History    Gravida  0   Para  0   Term  0   Preterm  0   AB  0   Living  0     SAB  0   TAB  0   Ectopic  0   Multiple  0   Live Births  0                  Patient Active Problem List   Diagnosis Date Noted  . Family history of breast cancer   . Family history of colon cancer         Past Medical History:  Diagnosis Date  . Amenorrhea   . Depression   . Family history of breast cancer   . Family history of colon cancer   . Fibroid   . Heart murmur   . Menstrual cramps   . Ovarian cyst          Past Surgical History:  Procedure Laterality Date  . MYOMECTOMY  2012           Current Outpatient Medications  Medication Sig Dispense Refill  . MAGNESIUM PO Take 1 tablet by mouth daily.    . Prenatal Multivit-Min-Fe-FA (PRE-NATAL PO) Take by mouth.    Marland Kitchen VITAMIN D PO Take by mouth.     No current facility-administered medications for this visit.      ALLERGIES: Flagyl [metronidazole] and Shellfish allergy       Family History  Problem Relation Age of Onset  . Fibroids Mother   . Diabetes Father   . Fibroids Sister   . Colon cancer Maternal Grandmother   . Lung cancer Maternal Grandmother   . Breast cancer Maternal Grandmother        dx <50  . Cancer Maternal Grandfather   . Breast cancer Paternal Grandmother 12  . Heart attack Paternal Grandmother   . Breast cancer Paternal Aunt 88       recurrance at 11  . Brain cancer Paternal Aunt   . Obesity Maternal Aunt   . Diabetes Maternal Aunt   . Heart failure Maternal Aunt   . Heart  disease Paternal Uncle     Social History        Socioeconomic History  . Marital status: Single    Spouse name: Not on file  . Number of children: Not on file  . Years of education: Not on file  . Highest education level: Not on file  Occupational History  . Not on file  Social Needs  . Financial resource strain: Not on file  . Food insecurity    Worry: Not on file    Inability: Not on file  . Transportation needs    Medical: Not on file    Non-medical: Not on file  Tobacco Use  . Smoking status: Former Smoker    Types: Cigars  . Smokeless tobacco: Never Used  Substance and Sexual Activity  . Alcohol use: Yes    Comment: rarely  . Drug use: No  . Sexual activity: Not Currently    Birth control/protection: None    Comment: female partner  Lifestyle  . Physical activity    Days per week: Not on file    Minutes per session: Not on file  . Stress: Not on file  Relationships  . Social Herbalist on phone: Not on file    Gets  together: Not on file    Attends religious service: Not on file    Active member of club or organization: Not on file    Attends meetings of clubs or organizations: Not on file    Relationship status: Not on file  . Intimate partner violence    Fear of current or ex partner: Not on file    Emotionally abused: Not on file    Physically abused: Not on file    Forced sexual activity: Not on file  Other Topics Concern  . Not on file  Social History Narrative  . Not on file    Review of Systems  Constitutional: Negative.   HENT: Negative.   Eyes: Negative.   Respiratory: Negative.   Cardiovascular: Negative.   Gastrointestinal: Negative.   Genitourinary: Negative.   Musculoskeletal: Negative.   Skin: Negative.   Neurological: Negative.   Endo/Heme/Allergies: Negative.   Psychiatric/Behavioral: Negative.     PHYSICAL EXAMINATION:    BP 106/64 (BP Location: Right Arm, Patient Position: Sitting, Cuff Size: Normal)   Pulse 84   Temp (!) 97.1 F (36.2 C) (Skin)   Wt 130 lb 9.6 oz (59.2 kg)   LMP 07/10/2019   BMI 23.13 kg/m     General appearance: alert, cooperative and appears stated age Neck: no adenopathy, supple, symmetrical, trachea midline and thyroid normal to inspection and palpation Heart: regular rate and rhythm Lungs: CTAB Abdomen: soft, non-tender; bowel sounds normal; no masses,  no organomegaly Extremities: normal, atraumatic, no cyanosis Skin: normal color, texture and turgor, no rashes or lesions Lymph: normal cervical supraclavicular and inguinal nodes Neurologic: grossly normal   ASSESSMENT Fibroid uterus Abnormal uterine bleeding Worsening severe dysmenorrhea Suspected adenomyosis Elevated risk of breast cancer, genetic testing is pending (should be back within the week)    PLAN Discussed total laparoscopic hysterectomy, bilateral salpingectomies, possible bilateral oophorectomies and cystoscopy. Reviewed the risks of the  procedure, including infection, bleeding, damage to bowel/badder/vessels/ureters.  Discussed the possible need for laparotomy. Discussed post operative recovery and risk of cuff dehiscence. All of her questions were answered         Addendum 08/12/19: Genetic testing was negative other than a variant of uncertain significance. Will not plan to  remove her ovaries.

## 2019-08-12 NOTE — Progress Notes (Signed)
HPI:  Meredith Lee was previously seen in the [place] clinic due to a family history of breast cancer and concerns regarding a hereditary predisposition to cancer. Please refer to our prior cancer genetics clinic note for more information regarding our discussion, assessment and recommendations, at the time. Meredith Lee recent genetic test results were disclosed to her, as were recommendations warranted by these results. These results and recommendations are discussed in more detail below.  CANCER HISTORY:  Oncology History   No history exists.    FAMILY HISTORY:  We obtained a detailed, 4-generation family history.  Significant diagnoses are listed below: Family History  Problem Relation Age of Onset  . Fibroids Mother   . Diabetes Father   . Fibroids Sister   . Colon cancer Maternal Grandmother   . Lung cancer Maternal Grandmother   . Breast cancer Maternal Grandmother        dx <50  . Cancer Maternal Grandfather   . Breast cancer Paternal Grandmother 88  . Heart attack Paternal Grandmother   . Breast cancer Paternal Aunt 41       recurrance at 81  . Brain cancer Paternal Aunt   . Obesity Maternal Aunt   . Diabetes Maternal Aunt   . Heart failure Maternal Aunt   . Heart disease Paternal Uncle       The patient does not have children.  She has a twin sister and another sister who are both cancer free.  Her father is deceased and her mother is living.  The patient's father died of heart disease at 61.  He had a brother and sister.  His sister had breast cancer at 39 and a second breast cancer in her 107's.  She has also been diagnosed with brain cancer.  It is unknown if she has had genetic testing.  His brother's daughter had genetic testing that is reportedly negative.  The paternal grandparents are deceased.  The grandmother had breast cancer at 4 and died in her 82's from heart disease.  The patient's mother is living and has not had cancer.  She had two  sisters and a brother.  One sister died of heart disease.  The maternal grandparents are deceased. The grandmother had breast cancer under 85, and colon cancer and lung cancer over 23.  Meredith Lee is unsure of previous family history of genetic testing for hereditary cancer risks. Patient's maternal ancestors are of African American descent, and paternal ancestors are of African American descent. There is no reported Ashkenazi Jewish ancestry. There is no known consanguinity.  GENETIC TEST RESULTS: Genetic testing reported out on August 12, 2019 through the multi-cancer panel found no pathogenic mutations. The Multi-Gene Panel offered by [facility] includes sequencing and/or deletion duplication testing of the following 85 genes: AIP, ALK, APC, ATM, AXIN2,BAP1,  BARD1, BLM, BMPR1A, BRCA1, BRCA2, BRIP1, CASR, CDC73, CDH1, CDK4, CDKN1B, CDKN1C, CDKN2A (p14ARF), CDKN2A (p16INK4a), CEBPA, CHEK2, CTNNA1, DICER1, DIS3L2, EGFR (c.2369C>T, p.Thr790Met variant only), EPCAM (Deletion/duplication testing only), FH, FLCN, GATA2, GPC3, GREM1 (Promoter region deletion/duplication testing only), HOXB13 (c.251G>A, p.Gly84Glu), HRAS, KIT, MAX, MEN1, MET, MITF (c.952G>A, p.Glu318Lys variant only), MLH1, MSH2, MSH3, MSH6, MUTYH, NBN, NF1, NF2, NTHL1, PALB2, PDGFRA, PHOX2B, PMS2, POLD1, POLE, POT1, PRKAR1A, PTCH1, PTEN, RAD50, RAD51C, RAD51D, RB1, RECQL4, RET, RNF43, RUNX1, SDHAF2, SDHA (sequence changes only), SDHB, SDHC, SDHD, SMAD4, SMARCA4, SMARCB1, SMARCE1, STK11, SUFU, TERC, TERT, TMEM127, TP53, TSC1, TSC2, VHL, WRN and WT1.  The test report has been scanned into EPIC and is located  under the Molecular Pathology section of the Results Review tab.  A portion of the result report is included below for reference.     We discussed with Meredith Lee that because current genetic testing is not perfect, it is possible there may be a gene mutation in one of these genes that current testing cannot detect, but that chance is small.  We  also discussed, that there could be another gene that has not yet been discovered, or that we have not yet tested, that is responsible for the cancer diagnoses in the family. It is also possible there is a hereditary cause for the cancer in the family that [name] did not inherit and therefore was not identified in her testing.  Therefore, it is important to remain in touch with cancer genetics in the future so that we can continue to offer [name] the most up to date genetic testing.   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  At this time, it is unknown if this variant is associated with increased cancer risk or if this is a normal finding, but most variants such as this get reclassified to being inconsequential. It should not be used to make medical management decisions. With time, we suspect the lab will determine the significance of this variant, if any. If we do learn more about it, we will try to contact [name] to discuss it further. However, it is important to stay in touch with [place] periodically and keep the address and phone number up to date.  ADDITIONAL GENETIC TESTING: We discussed with Meredith Lee that her genetic testing was fairly extensive.  If there are genes identified to increase cancer risk that can be analyzed in the future, we would be happy to discuss and coordinate this testing at that time.    CANCER SCREENING RECOMMENDATIONS: Meredith Lee test result is considered negative (normal).  This means that we have not identified a hereditary cause for her family history of cancer at this time. Most cancers happen by chance and this negative test suggests that her cancer may fall into this category.    While reassuring, this does not definitively rule out a hereditary predisposition to cancer. It is still possible that there could be genetic mutations that are undetectable by current technology. There  could be genetic mutations in genes that have not been tested or identified to increase cancer risk.  Therefore, it is recommended she continue to follow the cancer management and screening guidelines provided by her primary healthcare provider.   An individual's cancer risk and medical management are not determined by genetic test results alone. Overall cancer risk assessment incorporates additional factors, including personal medical history, family history, and any available genetic information that may result in a personalized plan for cancer prevention and surveillance  Based on Ms. Thaw's family of cancer, as well as her genetic test results, statistical models (Tyrer [place])  and literature data were used to estimate her risk of developing breast cancer. These estimate her lifetime risk of developing breast cancer to be approximately 23.5%.  The patient's lifetime breast cancer risk is a preliminary estimate based on available information using one of several models endorsed by the [place] (ACS). The ACS recommends consideration of breast MRI screening as an adjunct to mammography for patients at high risk (defined as 20% or greater lifetime risk). A more detailed breast cancer risk assessment can be considered,  if clinically indicated.   Ms. Schlender has been determined to be at high risk for breast cancer.  Therefore, we recommend that annual screening with mammography and breast MRI begin at age 34, or 10 years prior to the age of breast cancer diagnosis in a relative (whichever is earlier).  We, therefore, discussed that it is reasonable for Ms. Lurry to be followed by a high-risk breast cancer clinic; in addition to a yearly mammogram and physical exam by a healthcare provider, she should discuss the usefulness of an annual breast MRI with the high-risk clinic providers.  She asked that she be referred back to Dr. Talbert Nan and follow up with her there.    RECOMMENDATIONS FOR  FAMILY MEMBERS:  Individuals in this family might be at some increased risk of developing cancer, over the general population risk, simply due to the family history of cancer.  We recommended women in this family have a yearly mammogram beginning at age 5, or 61 years younger than the earliest onset of cancer, an annual clinical breast exam, and perform monthly breast self-exams. Women in this family should also have a gynecological exam as recommended by their primary provider. All family members should have a colonoscopy by age 33.  FOLLOW-UP: Lastly, we discussed with Ms. Marteney that cancer genetics is a rapidly advancing field and it is possible that new genetic tests will be appropriate for her and/or her family members in the future. We encouraged her to remain in contact with cancer genetics on an annual basis so we can update her personal and family histories and let her know of advances in cancer genetics that may benefit this family.   Our contact number was provided. Ms. Blacklock questions were answered to her satisfaction, and she knows she is welcome to call us at anytime with additional questions or concerns.   Roma Kayser, [place], [facility] Licensed, Certified Genetic Counselor [email or web address]

## 2019-08-12 NOTE — Telephone Encounter (Signed)
Revealed negative genetic testing.  Discussed that we do not know why there is cancer in the family. It could be due to a different gene that we are not testing, or maybe our current technology may not be able to pick something up.  It will be important for her to keep in contact with genetics to keep up with whether additional testing may be needed.   SDHB VUS identified.  This will not affect medical management.  Patient is at high risk for breast cancer due to [employer].  She will be referred to high risk breast clinic.

## 2019-08-15 ENCOUNTER — Other Ambulatory Visit (HOSPITAL_COMMUNITY)
Admission: RE | Admit: 2019-08-15 | Discharge: 2019-08-15 | Disposition: A | Discharge status: Home / Self Care | Payer: BC Managed Care – PPO | Source: Ambulatory Visit | Attending: Obstetrics and Gynecology | Admitting: Obstetrics and Gynecology

## 2019-08-15 DIAGNOSIS — Z01812 Encounter for preprocedural laboratory examination: Secondary | ICD-10-CM | POA: Diagnosis not present

## 2019-08-15 DIAGNOSIS — Z20828 Contact with and (suspected) exposure to other viral communicable diseases: Secondary | ICD-10-CM | POA: Insufficient documentation

## 2019-08-15 NOTE — Patient Instructions (Signed)
Starkisha Mercier       Your procedure is scheduled on 08/19/19   Report to [place]  at  5:30 A.M.   Call this number if you have problems the morning of [phone]   OUR ADDRESS IS [place], WE ARE [facility]ATED [facility] WITH [facility].   Remember:    Do not eat food After Midnight  . YOU MAY HAVE CLEAR LIQUIDS FROM MIDNIGHT UNTIL 4:30AM. At 4:30 AM   Please finish the prescribed Pre-Surgery  drink.   Nothing by mouth after you finish the  drink !   Take these medicines the morning of surgery with A SIP OF WATER  none   Do not wear jewelry, make-up or nail polish.  Do not wear lotions, powders, or perfumes, or deoderant.  Do not shave 48 hours prior to surgery.   Do not bring valuables to the hospital.  [facility] is not responsible for any belongings or valuables.  Contacts, dentures or bridgework may not be worn into surgery.    For patients admitted to the hospital, discharge time will be determined by your treatment team.  Patients discharged the day of surgery will not be allowed to drive home.   Special instructions:  No overnight visitors at this time.  Visiting time ends at 8:00 PM  Please read over the following fact sheets that you were given:       [facility] - Preparing for Surgery  Before surgery, you can play an important role.   Because skin is not sterile, your skin needs to be as free of germs as possible.   You can reduce the number of germs on your skin by washing with CHG (chlorahexidine gluconate) soap before surgery.   CHG is an antiseptic cleaner which kills germs and bonds with the skin to continue killing germs even after washing. Please DO NOT use if you have an allergy to CHG or antibacterial soaps .  If your skin becomes reddened/irritated stop using the CHG and inform your nurse when you arrive at Short Stay. Do not shave (including legs and underarms) for at least 48  hours prior to the first CHG shower  Please follow these instructions carefully:  1.  Shower with CHG Soap the night before surgery and the  morning of Surgery.  2.  If you choose to wash your hair, wash your hair first as usual with your  normal  shampoo.  3.  After you shampoo, rinse your hair and body thoroughly to remove the  shampoo.                                        4.  Use CHG as you would any other liquid soap.  You can apply chg directly  to the skin and wash                       Gently with a scrungie or clean washcloth.  5.  Apply the CHG Soap to your body ONLY FROM THE NECK DOWN.   Do not use on face/ open                           Wound or open sores. Avoid contact with eyes, ears mouth and genitals (private parts).  Wash face,  Genitals (private parts) with your normal soap.             6.  Wash thoroughly, paying special attention to the area where your surgery  will be performed.  7.  Thoroughly rinse your body with warm water from the neck down.  8.  DO NOT shower/wash with your normal soap after using and rinsing off  the CHG Soap.                9.  Pat yourself dry with a clean towel.            10.  Wear clean pajamas.            11.  Place clean sheets on your bed the night of your first shower and do not  sleep with pets. Day of Surgery : Do not apply any lotions/deodorants the morning of surgery.  Please wear clean clothes to the hospital/surgery center.  FAILURE TO FOLLOW THESE INSTRUCTIONS MAY RESULT IN THE CANCELLATION OF YOUR SURGERY PATIENT SIGNATURE_________________________________  NURSE SIGNATURE__________________________________  ________________________________________________________________________

## 2019-08-16 ENCOUNTER — Other Ambulatory Visit: Payer: Self-pay

## 2019-08-16 ENCOUNTER — Encounter (HOSPITAL_COMMUNITY): Payer: Self-pay

## 2019-08-16 ENCOUNTER — Encounter (HOSPITAL_COMMUNITY)
Admission: RE | Admit: 2019-08-16 | Discharge: 2019-08-16 | Disposition: A | Discharge status: Home / Self Care | Payer: BC Managed Care – PPO | Source: Ambulatory Visit | Attending: Obstetrics and Gynecology | Admitting: Obstetrics and Gynecology

## 2019-08-16 DIAGNOSIS — Z01812 Encounter for preprocedural laboratory examination: Secondary | ICD-10-CM | POA: Insufficient documentation

## 2019-08-16 DIAGNOSIS — D259 Leiomyoma of uterus, unspecified: Secondary | ICD-10-CM | POA: Diagnosis not present

## 2019-08-16 DIAGNOSIS — N946 Dysmenorrhea, unspecified: Secondary | ICD-10-CM | POA: Diagnosis not present

## 2019-08-16 LAB — CBC WITH DIFFERENTIAL/PLATELET
Abs Immature Granulocytes: 0.01 10*3/uL (ref 0.00–0.07)
Basophils Absolute: 0 10*3/uL (ref 0.0–0.1)
Basophils Relative: 0 %
Eosinophils Absolute: 0 10*3/uL (ref 0.0–0.5)
Eosinophils Relative: 1 %
HCT: 39.2 % (ref 36.0–46.0)
Hemoglobin: 13.1 g/dL (ref 12.0–15.0)
Immature Granulocytes: 0 %
Lymphocytes Relative: 41 %
Lymphs Abs: 1.4 10*3/uL (ref 0.7–4.0)
MCH: 33.2 pg (ref 26.0–34.0)
MCHC: 33.4 g/dL (ref 30.0–36.0)
MCV: 99.2 fL (ref 80.0–100.0)
Monocytes Absolute: 0.4 10*3/uL (ref 0.1–1.0)
Monocytes Relative: 12 %
Neutro Abs: 1.5 10*3/uL — ABNORMAL LOW (ref 1.7–7.7)
Neutrophils Relative %: 46 %
Platelets: 235 10*3/uL (ref 150–400)
RBC: 3.95 MIL/uL (ref 3.87–5.11)
RDW: 11.9 % (ref 11.5–15.5)
WBC: 3.4 10*3/uL — ABNORMAL LOW (ref 4.0–10.5)
nRBC: 0 % (ref 0.0–0.2)

## 2019-08-16 LAB — BASIC METABOLIC PANEL
Anion gap: 8 (ref 5–15)
BUN: 8 mg/dL (ref 6–20)
CO2: 25 mmol/L (ref 22–32)
Calcium: 9.2 mg/dL (ref 8.9–10.3)
Chloride: 107 mmol/L (ref 98–111)
Creatinine, Ser: 0.53 mg/dL (ref 0.44–1.00)
GFR calc Af Amer: >60 mL/min (ref >60)
GFR calc non Af Amer: >60 mL/min (ref >60)
Glucose, Bld: 106 mg/dL — ABNORMAL HIGH (ref 70–99)
Potassium: 4.8 mmol/L (ref 3.5–5.1)
Sodium: 140 mmol/L (ref 135–145)

## 2019-08-16 LAB — ABO/RH: ABO/RH(D): O POS

## 2019-08-16 LAB — NOVEL CORONAVIRUS, NAA (HOSP ORDER, SEND-OUT TO REF LAB; TAT 18-24 HRS): SARS-CoV-2, NAA: NOT DETECTED

## 2019-08-16 NOTE — Progress Notes (Signed)
PCP - Dr. Lockie Pares Cardiologist - none  Chest x-ray - no EKG - no Stress Test - no ECHO - no Cardiac Cath - no  Sleep Study - no CPAP -   Fasting Blood Sugar - NA Checks Blood Sugar _____ times a day  Blood Thinner Instructions: NA Aspirin Instructions: Last Dose:  Anesthesia review:   Patient denies shortness of breath, fever, cough and chest pain at PAT appointment  Yes Patient verbalized understanding of instructions that were given to them at the PAT appointment. Patient was also instructed that they will need to review over the PAT instructions again at home before surgery. Yes

## 2019-08-18 NOTE — Anesthesia Preprocedure Evaluation (Addendum)
Anesthesia Evaluation  Patient identified by MRN, date of birth, ID band Patient awake    Reviewed: Allergy & Precautions, NPO status , Patient's Chart, lab work & pertinent test results  History of Anesthesia Complications Negative for: history of anesthetic complications  Airway Mallampati: I  TM Distance: >3 FB Neck ROM: Full    Dental  (+) Dental Advisory Given, Teeth Intact   Pulmonary former smoker,    Pulmonary exam normal        Cardiovascular negative cardio ROS Normal cardiovascular exam     Neuro/Psych PSYCHIATRIC DISORDERS Depression negative neurological ROS     GI/Hepatic negative GI ROS, Neg liver ROS,   Endo/Other  negative endocrine ROS  Renal/GU negative Renal ROS     Musculoskeletal negative musculoskeletal ROS (+)   Abdominal   Peds  Hematology negative hematology ROS (+)   Anesthesia Other Findings   Reproductive/Obstetrics  Fibroids                             Anesthesia Physical Anesthesia Plan  ASA: I  Anesthesia Plan: General   Post-op Pain Management:    Induction: Intravenous  PONV Risk Score and Plan: 4 or greater and Treatment may vary due to age or medical condition, Ondansetron, Midazolam, Scopolamine patch - Pre-op and Dexamethasone  Airway Management Planned: Oral ETT  Additional Equipment: None  Intra-op Plan:   Post-operative Plan: Extubation in OR  Informed Consent: I have reviewed the patients History and Physical, chart, labs and discussed the procedure including the risks, benefits and alternatives for the proposed anesthesia with the patient or authorized representative who has indicated his/her understanding and acceptance.     Dental advisory given  Plan Discussed with: CRNA and Anesthesiologist  Anesthesia Plan Comments:        Anesthesia Quick Evaluation

## 2019-08-19 ENCOUNTER — Ambulatory Visit (HOSPITAL_BASED_OUTPATIENT_CLINIC_OR_DEPARTMENT_OTHER): Payer: BC Managed Care – PPO | Admitting: Physician Assistant

## 2019-08-19 ENCOUNTER — Ambulatory Visit (HOSPITAL_BASED_OUTPATIENT_CLINIC_OR_DEPARTMENT_OTHER): Payer: BC Managed Care – PPO | Admitting: Anesthesiology

## 2019-08-19 ENCOUNTER — Ambulatory Visit (HOSPITAL_BASED_OUTPATIENT_CLINIC_OR_DEPARTMENT_OTHER)
Admission: RE | Admit: 2019-08-19 | Discharge: 2019-08-20 | Disposition: A | Discharge status: Home / Self Care | Payer: BC Managed Care – PPO | Attending: Obstetrics and Gynecology | Admitting: Obstetrics and Gynecology

## 2019-08-19 ENCOUNTER — Encounter (HOSPITAL_BASED_OUTPATIENT_CLINIC_OR_DEPARTMENT_OTHER): Payer: Self-pay

## 2019-08-19 ENCOUNTER — Encounter (HOSPITAL_BASED_OUTPATIENT_CLINIC_OR_DEPARTMENT_OTHER)
Admission: RE | Disposition: A | Discharge status: Home / Self Care | Payer: Self-pay | Source: Home / Self Care | Attending: Obstetrics and Gynecology

## 2019-08-19 DIAGNOSIS — N736 Female pelvic peritoneal adhesions (postinfective): Secondary | ICD-10-CM

## 2019-08-19 DIAGNOSIS — K660 Peritoneal adhesions (postprocedural) (postinfection): Secondary | ICD-10-CM | POA: Diagnosis not present

## 2019-08-19 DIAGNOSIS — D259 Leiomyoma of uterus, unspecified: Secondary | ICD-10-CM | POA: Insufficient documentation

## 2019-08-19 DIAGNOSIS — Z800 Family history of malignant neoplasm of digestive organs: Secondary | ICD-10-CM | POA: Insufficient documentation

## 2019-08-19 DIAGNOSIS — Z87891 Personal history of nicotine dependence: Secondary | ICD-10-CM | POA: Insufficient documentation

## 2019-08-19 DIAGNOSIS — N939 Abnormal uterine and vaginal bleeding, unspecified: Secondary | ICD-10-CM

## 2019-08-19 DIAGNOSIS — N946 Dysmenorrhea, unspecified: Secondary | ICD-10-CM | POA: Diagnosis not present

## 2019-08-19 DIAGNOSIS — Z90710 Acquired absence of both cervix and uterus: Secondary | ICD-10-CM | POA: Diagnosis present

## 2019-08-19 HISTORY — PX: CYSTOSCOPY: SHX5120

## 2019-08-19 HISTORY — PX: TOTAL LAPAROSCOPIC HYSTERECTOMY WITH SALPINGECTOMY: SHX6742

## 2019-08-19 HISTORY — PX: LYSIS OF ADHESION: SHX5961

## 2019-08-19 LAB — TYPE AND SCREEN
ABO/RH(D): O POS
Antibody Screen: NEGATIVE

## 2019-08-19 LAB — CBC
HCT: 41.8 % (ref 36.0–46.0)
Hemoglobin: 13.5 g/dL (ref 12.0–15.0)
MCH: 32.7 pg (ref 26.0–34.0)
MCHC: 32.3 g/dL (ref 30.0–36.0)
MCV: 101.2 fL — ABNORMAL HIGH (ref 80.0–100.0)
Platelets: 217 10*3/uL (ref 150–400)
RBC: 4.13 MIL/uL (ref 3.87–5.11)
RDW: 11.9 % (ref 11.5–15.5)
WBC: 10.9 10*3/uL — ABNORMAL HIGH (ref 4.0–10.5)
nRBC: 0 % (ref 0.0–0.2)

## 2019-08-19 LAB — POCT PREGNANCY, URINE: Preg Test, Ur: NEGATIVE

## 2019-08-19 SURGERY — HYSTERECTOMY, TOTAL, LAPAROSCOPIC, WITH SALPINGECTOMY
Anesthesia: General | Site: Bladder

## 2019-08-19 MED ORDER — ROCURONIUM BROMIDE 100 MG/10ML IV SOLN
INTRAVENOUS | Status: DC | PRN
  Administered 2019-08-19: 60 mg via INTRAVENOUS
  Administered 2019-08-19 (×4): 10 mg via INTRAVENOUS

## 2019-08-19 MED ORDER — IBUPROFEN 800 MG PO TABS
800.0000 mg | ORAL_TABLET | Freq: Four times a day (QID) | ORAL | Status: DC
  Filled 2019-08-19: qty 1

## 2019-08-19 MED ORDER — KETOROLAC TROMETHAMINE 30 MG/ML IJ SOLN
INTRAMUSCULAR | Status: AC
  Filled 2019-08-19: qty 1

## 2019-08-19 MED ORDER — LACTATED RINGERS IV SOLN
INTRAVENOUS | Status: DC
  Administered 2019-08-19 (×3): via INTRAVENOUS
  Filled 2019-08-19: qty 1000

## 2019-08-19 MED ORDER — OXYCODONE HCL 5 MG PO TABS
5.0000 mg | ORAL_TABLET | Freq: Once | ORAL | Status: DC | PRN
  Filled 2019-08-19: qty 1

## 2019-08-19 MED ORDER — ENOXAPARIN SODIUM 40 MG/0.4ML ~~LOC~~ SOLN
SUBCUTANEOUS | Status: AC
  Filled 2019-08-19: qty 0.4

## 2019-08-19 MED ORDER — PROPOFOL 10 MG/ML IV BOLUS
INTRAVENOUS | Status: AC
  Filled 2019-08-19: qty 40

## 2019-08-19 MED ORDER — ACETAMINOPHEN 325 MG PO TABS
ORAL_TABLET | ORAL | Status: AC
  Filled 2019-08-19: qty 2

## 2019-08-19 MED ORDER — GABAPENTIN 100 MG PO CAPS
100.0000 mg | ORAL_CAPSULE | Freq: Two times a day (BID) | ORAL | Status: DC
  Administered 2019-08-19: 100 mg via ORAL
  Filled 2019-08-19 (×2): qty 1

## 2019-08-19 MED ORDER — OXYCODONE HCL 5 MG PO TABS
5.0000 mg | ORAL_TABLET | ORAL | Status: DC | PRN
  Filled 2019-08-19: qty 2

## 2019-08-19 MED ORDER — ENOXAPARIN SODIUM 40 MG/0.4ML ~~LOC~~ SOLN
40.0000 mg | SUBCUTANEOUS | Status: AC
  Administered 2019-08-19: 40 mg via SUBCUTANEOUS
  Filled 2019-08-19: qty 0.4

## 2019-08-19 MED ORDER — LIDOCAINE HCL (CARDIAC) PF 100 MG/5ML IV SOSY
PREFILLED_SYRINGE | INTRAVENOUS | Status: DC | PRN
  Administered 2019-08-19: 60 mg via INTRAVENOUS

## 2019-08-19 MED ORDER — FENTANYL CITRATE (PF) 100 MCG/2ML IJ SOLN
25.0000 ug | INTRAMUSCULAR | Status: DC | PRN
  Filled 2019-08-19: qty 1

## 2019-08-19 MED ORDER — DOCUSATE SODIUM 100 MG PO CAPS
ORAL_CAPSULE | ORAL | Status: AC
  Filled 2019-08-19: qty 1

## 2019-08-19 MED ORDER — FENTANYL CITRATE (PF) 100 MCG/2ML IJ SOLN
INTRAMUSCULAR | Status: AC
  Filled 2019-08-19: qty 4

## 2019-08-19 MED ORDER — ACETAMINOPHEN 500 MG PO TABS
1000.0000 mg | ORAL_TABLET | Freq: Four times a day (QID) | ORAL | Status: DC
  Administered 2019-08-19 – 2019-08-20 (×4): 1000 mg via ORAL
  Filled 2019-08-19: qty 2

## 2019-08-19 MED ORDER — HYDROMORPHONE HCL 1 MG/ML IJ SOLN
0.2000 mg | INTRAMUSCULAR | Status: DC | PRN
  Filled 2019-08-19: qty 1

## 2019-08-19 MED ORDER — ALUM & MAG HYDROXIDE-SIMETH 200-200-20 MG/5ML PO SUSP
30.0000 mL | ORAL | Status: DC | PRN
  Filled 2019-08-19: qty 30

## 2019-08-19 MED ORDER — OXYCODONE HCL 5 MG/5ML PO SOLN
5.0000 mg | Freq: Once | ORAL | Status: DC | PRN
  Filled 2019-08-19: qty 5

## 2019-08-19 MED ORDER — PROMETHAZINE HCL 25 MG/ML IJ SOLN
6.2500 mg | INTRAMUSCULAR | Status: DC | PRN
  Filled 2019-08-19: qty 1

## 2019-08-19 MED ORDER — CEFAZOLIN SODIUM-DEXTROSE 2-4 GM/100ML-% IV SOLN
2.0000 g | INTRAVENOUS | Status: AC
  Administered 2019-08-19 (×2): 2 g via INTRAVENOUS
  Filled 2019-08-19: qty 100

## 2019-08-19 MED ORDER — ACETAMINOPHEN 500 MG PO TABS
1000.0000 mg | ORAL_TABLET | ORAL | Status: AC
  Administered 2019-08-19: 1000 mg via ORAL
  Filled 2019-08-19: qty 2

## 2019-08-19 MED ORDER — MIDAZOLAM HCL 2 MG/2ML IJ SOLN
INTRAMUSCULAR | Status: AC
  Filled 2019-08-19: qty 2

## 2019-08-19 MED ORDER — DEXAMETHASONE SODIUM PHOSPHATE 4 MG/ML IJ SOLN
INTRAMUSCULAR | Status: DC | PRN
  Administered 2019-08-19: 10 mg via INTRAVENOUS

## 2019-08-19 MED ORDER — ACETAMINOPHEN 500 MG PO TABS
ORAL_TABLET | ORAL | Status: AC
  Filled 2019-08-19: qty 2

## 2019-08-19 MED ORDER — EPHEDRINE 5 MG/ML INJ
INTRAVENOUS | Status: AC
  Filled 2019-08-19: qty 10

## 2019-08-19 MED ORDER — KETOROLAC TROMETHAMINE 30 MG/ML IJ SOLN
30.0000 mg | Freq: Four times a day (QID) | INTRAMUSCULAR | Status: DC
  Administered 2019-08-19 – 2019-08-20 (×3): 30 mg via INTRAVENOUS
  Filled 2019-08-19: qty 1

## 2019-08-19 MED ORDER — BUPIVACAINE HCL (PF) 0.25 % IJ SOLN
INTRAMUSCULAR | Status: DC | PRN
  Administered 2019-08-19: 6 mL

## 2019-08-19 MED ORDER — CEFAZOLIN SODIUM 1 G IJ SOLR
INTRAMUSCULAR | Status: AC
  Filled 2019-08-19: qty 20

## 2019-08-19 MED ORDER — ONDANSETRON HCL 4 MG/2ML IJ SOLN
INTRAMUSCULAR | Status: DC | PRN
  Administered 2019-08-19: 4 mg via INTRAVENOUS

## 2019-08-19 MED ORDER — ENOXAPARIN SODIUM 40 MG/0.4ML ~~LOC~~ SOLN
40.0000 mg | SUBCUTANEOUS | Status: DC
  Filled 2019-08-19: qty 0.4

## 2019-08-19 MED ORDER — DEXAMETHASONE SODIUM PHOSPHATE 10 MG/ML IJ SOLN
INTRAMUSCULAR | Status: AC
  Filled 2019-08-19: qty 1

## 2019-08-19 MED ORDER — KCL IN DEXTROSE-NACL 20-5-0.45 MEQ/L-%-% IV SOLN
INTRAVENOUS | Status: DC
  Filled 2019-08-19 (×2): qty 1000

## 2019-08-19 MED ORDER — SUGAMMADEX SODIUM 200 MG/2ML IV SOLN
INTRAVENOUS | Status: DC | PRN
  Administered 2019-08-19: 200 mg via INTRAVENOUS

## 2019-08-19 MED ORDER — CEFAZOLIN SODIUM-DEXTROSE 2-4 GM/100ML-% IV SOLN
INTRAVENOUS | Status: AC
  Filled 2019-08-19: qty 100

## 2019-08-19 MED ORDER — ONDANSETRON HCL 4 MG PO TABS
4.0000 mg | ORAL_TABLET | Freq: Four times a day (QID) | ORAL | Status: DC | PRN
  Filled 2019-08-19: qty 1

## 2019-08-19 MED ORDER — DOCUSATE SODIUM 100 MG PO CAPS
100.0000 mg | ORAL_CAPSULE | Freq: Two times a day (BID) | ORAL | Status: DC
  Administered 2019-08-19: 100 mg via ORAL
  Filled 2019-08-19: qty 1

## 2019-08-19 MED ORDER — PROPOFOL 10 MG/ML IV BOLUS
INTRAVENOUS | Status: DC | PRN
  Administered 2019-08-19: 50 mg via INTRAVENOUS
  Administered 2019-08-19: 180 mg via INTRAVENOUS
  Administered 2019-08-19: 50 mg via INTRAVENOUS

## 2019-08-19 MED ORDER — KETOROLAC TROMETHAMINE 30 MG/ML IJ SOLN
INTRAMUSCULAR | Status: DC | PRN
  Administered 2019-08-19: 30 mg via INTRAVENOUS

## 2019-08-19 MED ORDER — FENTANYL CITRATE (PF) 100 MCG/2ML IJ SOLN
INTRAMUSCULAR | Status: AC
  Filled 2019-08-19: qty 2

## 2019-08-19 MED ORDER — ROCURONIUM BROMIDE 10 MG/ML (PF) SYRINGE
PREFILLED_SYRINGE | INTRAVENOUS | Status: AC
  Filled 2019-08-19: qty 10

## 2019-08-19 MED ORDER — EPHEDRINE SULFATE 50 MG/ML IJ SOLN
INTRAMUSCULAR | Status: DC | PRN
  Administered 2019-08-19 (×2): 10 mg via INTRAVENOUS

## 2019-08-19 MED ORDER — SODIUM CHLORIDE 0.9 % IR SOLN
Status: DC | PRN
  Administered 2019-08-19: 1000 mL
  Administered 2019-08-19: 3000 mL

## 2019-08-19 MED ORDER — KETOROLAC TROMETHAMINE 30 MG/ML IJ SOLN
30.0000 mg | Freq: Once | INTRAMUSCULAR | Status: AC
  Administered 2019-08-20: 30 mg via INTRAVENOUS
  Filled 2019-08-19: qty 1

## 2019-08-19 MED ORDER — GABAPENTIN 300 MG PO CAPS
ORAL_CAPSULE | ORAL | Status: AC
  Filled 2019-08-19: qty 1

## 2019-08-19 MED ORDER — ROPIVACAINE HCL 5 MG/ML IJ SOLN
INTRAMUSCULAR | Status: DC | PRN
  Administered 2019-08-19: 30 mL

## 2019-08-19 MED ORDER — SODIUM CHLORIDE (PF) 0.9 % IJ SOLN
INTRAMUSCULAR | Status: DC | PRN
  Administered 2019-08-19: 30 mL

## 2019-08-19 MED ORDER — FENTANYL CITRATE (PF) 100 MCG/2ML IJ SOLN
INTRAMUSCULAR | Status: DC | PRN
  Administered 2019-08-19 (×8): 50 ug via INTRAVENOUS

## 2019-08-19 MED ORDER — ONDANSETRON HCL 4 MG/2ML IJ SOLN
INTRAMUSCULAR | Status: AC
  Filled 2019-08-19: qty 2

## 2019-08-19 MED ORDER — LIDOCAINE 2% (20 MG/ML) 5 ML SYRINGE
INTRAMUSCULAR | Status: AC
  Filled 2019-08-19: qty 5

## 2019-08-19 MED ORDER — ONDANSETRON HCL 4 MG/2ML IJ SOLN
4.0000 mg | Freq: Four times a day (QID) | INTRAMUSCULAR | Status: DC | PRN
  Filled 2019-08-19: qty 2

## 2019-08-19 MED ORDER — GABAPENTIN 300 MG PO CAPS
300.0000 mg | ORAL_CAPSULE | ORAL | Status: AC
  Administered 2019-08-19: 300 mg via ORAL
  Filled 2019-08-19: qty 1

## 2019-08-19 MED ORDER — MIDAZOLAM HCL 5 MG/5ML IJ SOLN
INTRAMUSCULAR | Status: DC | PRN
  Administered 2019-08-19: 2 mg via INTRAVENOUS

## 2019-08-19 MED ORDER — MENTHOL 3 MG MT LOZG
1.0000 | LOZENGE | OROMUCOSAL | Status: DC | PRN
  Filled 2019-08-19: qty 9

## 2019-08-19 MED ORDER — ZOLPIDEM TARTRATE 5 MG PO TABS
5.0000 mg | ORAL_TABLET | Freq: Every evening | ORAL | Status: DC | PRN
  Filled 2019-08-19: qty 1

## 2019-08-19 SURGICAL SUPPLY — 75 items
APPLICATOR ARISTA FLEXITIP XL (MISCELLANEOUS) ×5 IMPLANT
BLADE SURG 10 STRL SS (BLADE) IMPLANT
CABLE HIGH FREQUENCY MONO STRZ (ELECTRODE) ×5 IMPLANT
CANISTER SUCT 3000ML PPV (MISCELLANEOUS) ×5 IMPLANT
CELL SAVER LIPIGURD (MISCELLANEOUS) IMPLANT
COVER MAYO STAND STRL (DRAPES) ×5 IMPLANT
COVER TABLE BACK 60X90 (DRAPES) IMPLANT
COVER WAND RF STERILE (DRAPES) ×5 IMPLANT
DECANTER SPIKE VIAL GLASS SM (MISCELLANEOUS) ×15 IMPLANT
DERMABOND ADVANCED (GAUZE/BANDAGES/DRESSINGS) ×2
DERMABOND ADVANCED .7 DNX12 (GAUZE/BANDAGES/DRESSINGS) ×3 IMPLANT
DRSG COVADERM PLUS 2X2 (GAUZE/BANDAGES/DRESSINGS) ×5 IMPLANT
DRSG OPSITE POSTOP 3X4 (GAUZE/BANDAGES/DRESSINGS) ×5 IMPLANT
DURAPREP 26ML APPLICATOR (WOUND CARE) ×5 IMPLANT
EXTRT SYSTEM ALEXIS 14CM (MISCELLANEOUS)
EXTRT SYSTEM ALEXIS 17CM (MISCELLANEOUS)
GAUZE 4X4 16PLY RFD (DISPOSABLE) ×5 IMPLANT
GLOVE BIO SURGEON STRL SZ 6.5 (GLOVE) ×4 IMPLANT
GLOVE BIO SURGEON STRL SZ7 (GLOVE) ×15 IMPLANT
GLOVE BIO SURGEONS STRL SZ 6.5 (GLOVE) ×1
GLOVE BIOGEL PI IND STRL 7.0 (GLOVE) ×12 IMPLANT
GLOVE BIOGEL PI IND STRL 7.5 (GLOVE) ×15 IMPLANT
GLOVE BIOGEL PI IND STRL 8.5 (GLOVE) ×6 IMPLANT
GLOVE BIOGEL PI INDICATOR 7.0 (GLOVE) ×8
GLOVE BIOGEL PI INDICATOR 7.5 (GLOVE) ×10
GLOVE BIOGEL PI INDICATOR 8.5 (GLOVE) ×4
GLOVE INDICATOR 8.5 STRL (GLOVE) ×10 IMPLANT
GOWN STRL REUS W/TWL LRG LVL3 (GOWN DISPOSABLE) ×20 IMPLANT
HARMONIC RUM II 2.5CM SILVER (DISPOSABLE)
HARMONIC RUM II 3.0CM SILVER (DISPOSABLE) ×5
HARMONIC RUM II 3.5CM SILVER (DISPOSABLE)
HARMONIC RUM II 4.0CM SILVER (DISPOSABLE)
HEMOSTAT ARISTA ABSORB 3G PWDR (HEMOSTASIS) ×5 IMPLANT
LIGASURE VESSEL 5MM BLUNT TIP (ELECTROSURGICAL) ×5 IMPLANT
NEEDLE INSUFFLATION 120MM (ENDOMECHANICALS) ×5 IMPLANT
PACK LAPAROSCOPY BASIN (CUSTOM PROCEDURE TRAY) ×5 IMPLANT
PACK TRENDGUARD 450 HYBRID PRO (MISCELLANEOUS) ×3 IMPLANT
POUCH LAPAROSCOPIC INSTRUMENT (MISCELLANEOUS) ×5 IMPLANT
PROTECTOR NERVE ULNAR (MISCELLANEOUS) ×10 IMPLANT
RETRACTOR WOUND ALXS 19CM XSML (INSTRUMENTS) IMPLANT
RTRCTR WOUND ALEXIS 19CM XSML (INSTRUMENTS)
SCALPEL HRMNC RUM II 2.5 SILVR (DISPOSABLE) IMPLANT
SCALPEL HRMNC RUM II 3.0 SILVR (DISPOSABLE) ×3 IMPLANT
SCALPEL HRMNC RUM II 3.5 SILVR (DISPOSABLE) IMPLANT
SCALPEL HRMNC RUM II 4.0 SILVR (DISPOSABLE) IMPLANT
SCISSORS LAP 5X35 DISP (ENDOMECHANICALS) ×5 IMPLANT
SET CYSTO W/LG BORE CLAMP LF (SET/KITS/TRAYS/PACK) ×5 IMPLANT
SET IRRIG TUBING LAPAROSCOPIC (IRRIGATION / IRRIGATOR) ×5 IMPLANT
SET TRI-LUMEN FLTR TB AIRSEAL (TUBING) ×5 IMPLANT
SHEARS HARMONIC ACE PLUS 36CM (ENDOMECHANICALS) ×5 IMPLANT
SLEEVE SURGEON STRL (DRAPES) ×5 IMPLANT
SUT VIC AB 0 CT1 36 (SUTURE) ×5 IMPLANT
SUT VIC AB 3-0 PS2 18 (SUTURE) ×2
SUT VIC AB 3-0 PS2 18XBRD (SUTURE) ×3 IMPLANT
SUT VICRYL 0 UR6 27IN ABS (SUTURE) ×5 IMPLANT
SUT VICRYL 4-0 PS2 18IN ABS (SUTURE) ×5 IMPLANT
SUT VLOC 180 0 9IN  GS21 (SUTURE)
SUT VLOC 180 0 9IN GS21 (SUTURE) IMPLANT
SYR 10ML LL (SYRINGE) ×5 IMPLANT
SYR 50ML LL SCALE MARK (SYRINGE) ×10 IMPLANT
SYSTEM CONTND EXTRCTN KII BLLN (MISCELLANEOUS) IMPLANT
TIP RUMI ORANGE 6.7MMX12CM (TIP) IMPLANT
TIP UTERINE 5.1X6CM LAV DISP (MISCELLANEOUS) IMPLANT
TIP UTERINE 6.7X10CM GRN DISP (MISCELLANEOUS) IMPLANT
TIP UTERINE 6.7X6CM WHT DISP (MISCELLANEOUS) IMPLANT
TIP UTERINE 6.7X8CM BLUE DISP (MISCELLANEOUS) ×5 IMPLANT
TOWEL OR 17X26 10 PK STRL BLUE (TOWEL DISPOSABLE) ×5 IMPLANT
TRAY FOLEY W/BAG SLVR 14FR (SET/KITS/TRAYS/PACK) ×5 IMPLANT
TRENDGUARD 450 HYBRID PRO PACK (MISCELLANEOUS) ×5
TROCAR ADV FIXATION 5X100MM (TROCAR) ×5 IMPLANT
TROCAR PORT AIRSEAL 5X120 (TROCAR) ×5 IMPLANT
TROCAR XCEL NON BLADE 8MM B8LT (ENDOMECHANICALS) ×5 IMPLANT
TROCAR XCEL NON-BLD 5MMX100MML (ENDOMECHANICALS) ×5 IMPLANT
TUBING TUR DISP (UROLOGICAL SUPPLIES) ×5 IMPLANT
WARMER LAPAROSCOPE (MISCELLANEOUS) ×5 IMPLANT

## 2019-08-19 NOTE — Anesthesia Procedure Notes (Signed)
Procedure Name: Intubation Date/Time: 08/19/2019 7:32 AM Performed by: Justice Rocher, CRNA Pre-anesthesia Checklist: Patient identified, Emergency Drugs available, Suction available and Patient being monitored Patient Re-evaluated:Patient Re-evaluated prior to induction Oxygen Delivery Method: Circle system utilized Preoxygenation: Pre-oxygenation with 100% oxygen Induction Type: IV induction Ventilation: Mask ventilation without difficulty Laryngoscope Size: Mac and 3 Grade View: Grade II Tube type: Oral Tube size: 7.0 mm Number of attempts: 1 Airway Equipment and Method: Stylet and Oral airway Placement Confirmation: ETT inserted through vocal cords under direct vision,  positive ETCO2 and breath sounds checked- equal and bilateral Secured at: 23 cm Tube secured with: Tape Dental Injury: Teeth and Oropharynx as per pre-operative assessment

## 2019-08-19 NOTE — Consult Note (Signed)
Reason for Consult: Adhesions Referring Physician: Dr. Kem Boroughs Meredith Lee is an 43 y.o. female.  HPI: The patient is a 43 year old black female who was in the operating room for a laparoscopic hysterectomy.  She had a small segment of sigmoid colon densely stuck to the uterus.  We were consulted intraoperatively for lysis of adhesions  Past Medical History:  Diagnosis Date  . Amenorrhea   . Depression   . Family history of breast cancer   . Family history of colon cancer   . Fibroid   . Heart murmur    as a child  . Menstrual cramps   . Ovarian cyst     Past Surgical History:  Procedure Laterality Date  . MYOMECTOMY  2012    Family History  Problem Relation Age of Onset  . Fibroids Mother   . Diabetes Father   . Fibroids Sister   . Colon cancer Maternal Grandmother   . Lung cancer Maternal Grandmother   . Breast cancer Maternal Grandmother        dx <50  . Cancer Maternal Grandfather   . Breast cancer Paternal Grandmother 36  . Heart attack Paternal Grandmother   . Breast cancer Paternal Aunt 17       recurrance at 40  . Brain cancer Paternal Aunt   . Obesity Maternal Aunt   . Diabetes Maternal Aunt   . Heart failure Maternal Aunt   . Heart disease Paternal Uncle     Social History:  reports that she has quit smoking. Her smoking use included cigars. She has never used smokeless tobacco. She reports current alcohol use. She reports that she does not use drugs.  Allergies:  Allergies  Allergen Reactions  . Flagyl [Metronidazole] Anaphylaxis  . Shellfish Allergy Diarrhea    Medications: I have reviewed the patient's current medications.  Results for orders placed or performed during the hospital encounter of 08/19/19 (from the past 48 hour(s))  Pregnancy, urine POC     Status: None   Collection Time: 08/19/19  6:12 AM  Result Value Ref Range   Preg Test, Ur NEGATIVE NEGATIVE    Comment:        THE SENSITIVITY OF THIS METHODOLOGY IS >24 mIU/mL      No results found.  Review of Systems  Unable to perform ROS: Intubated   Blood pressure 110/69, pulse 86, temperature 98.1 F (36.7 C), temperature source Oral, resp. rate 16, height 5\' 3"  (1.6 m), weight 59.2 kg, last menstrual period 07/15/2019, SpO2 100 %. Physical Exam  Constitutional: She appears well-developed. No distress.  HENT:  Head: Normocephalic and atraumatic.  Cardiovascular: Normal rate, regular rhythm and normal heart sounds.  Respiratory:  On vent in OR  GI:  This was an intraoperative consult for adhesions.  There was a small segment of sigmoid colon stuck to the uterus during a laparoscopic hysterectomy.  The bowel appeared to be healthy.  Skin: Skin is warm and dry.    Assessment/Plan: The patient will need laparoscopic lysis of adhesions intraoperatively to help with the laparoscopic hysterectomy.  Meredith Lee 08/19/2019, 9:21 AM

## 2019-08-19 NOTE — Op Note (Signed)
Preoperative Diagnosis: Fibroid uterus, abnormal uterine bleeding, severe dysmenorrhea  Postoperative Diagnosis: Same, extensive adhesions  Procedure:  Total Laparoscopic Hysterectomy with left salpingo-oophorectomy, right salpingectomy, extensive lysis of adhesions and cystoscopy  Surgeon: Dr Sumner Boast  Assistant: Dr Cora Daniels, an MD assistant was necessary for tissue manipulation, retraction and positioning due to the complexity of the case and hospital policies  Consultation: General Surgery (please see separate note from Dr Marlou Starks)  Anesthesia: GET  EBL: 300 cc  Fluids: 2,000 cc LR  Urine output: [id number] cc  Complications: none  Indications for surgery: The patient is a 43 year old female, who presented with a known fibroid uterus and worsening severe dysmenorrhea. Work up included an ultrasound with several small myomas and suspected adenomyosis. Endometrial biopsy was benign, normal Hgb, normal TSH, negative pap and hpv.  The patient is aware of the risks and complications involved with the surgery and consent was obtained prior to the procedure.  Findings: EUA: slightly enlarged, irregularly shaped uterus. No adnexal masses. Laparoscopy: fibroid uterus, extensive dense adhesions of the bowel to the posterior uterus and left adnexa. Extensive adhesions of the bladder to the anterior uterus. Normal right tube and ovary. Left hydrosalpinx, left tube adherent to the bowel and the ovary. The left ovary adherent to the uterus and left pelvic side wall.   Uterine weight: 284 grams.  Procedure: The patient was taken to the operating room with an IV in placed, preoperative antibiotics had been administered. She was placed in the dorsal lithotomy position. General anesthesia was administered. She was prepped and draped in the usual sterile fashion for an abdominal, vaginal surgery. A rumi uterine manipulator was placed, using a # 3 cup and a 8 cm extender. A foley catheter was placed.     The umbilicus was everted, injected with 0.25% marcaine and incised with a # 11 blade. 2 towel clips were used to elevated the umbilicus and a veress needle was placed into the abdominal cavity. The abdominal cavity was insufflated with CO2, with normal intraabdominal pressures. After adequate pneumo-insufflation the veress needle was removed and the 5 mm laparoscope was placed into the abdominal cavity using the opti-view trocar. The patient was placed in trendelenburg and the abdominal pelvic cavity was inspected. 3 more trocars were placed: 1 in each lower quadrant approximately 3 cm medial to and superior to the anterior superior iliac spine and one in the midline approximately 6 cm above the pubic symphysis in the midline. These areas were injected with 0.25% marcaine, incised with a #11 blade and all trocars were inserted with direct visualization with the laparoscope. A # 5 airseal trocar was placed in the RLQ, a 5 mm trocar in the LLQ and a #8 trocar in the midline. The abdominal pelvic cavity was again inspected. A mixture of 30 cc of Robivacaine and 30 cc of NS was place in the pelvic cavity.   The bowel adhesions were taken down using a combination of sharp and blunt dissection. After ~1 hour of taking down adhesions Dr Marlou Starks was called for a consultation. A portion of the colon was very densely adherent to the posterior uterus. Dr Marlou Starks took down this portion of the bowel and part of the bowel that was still adherent to the left adnexa.  The left adhexa was partially dissected from the pelvic side wall with blunt dissection. The left ureter could not be seen at this time. The peritoneum was opened lateral to the IP and the retroperitoneal space was entered  and dissected. The ureter was identified on the left pelvic side wall below where the adhesions of the adnexa were. The left infundibulopelvic ligament  was elevated from the pelvic sidewall, cauterized and cut with the ligasure device. The  round ligament and the posterior leaf of the broad ligament was taken down with the ligasure device. The bladder adhesions were taken down with a combination of sharp and blunt dissection. The bladder was filled to help with the dissection. The bladder was then emptied. The anterior leaf of the broad ligament was then taken down.  The harmonic scalpel was then used to take down the bladder flap and skeltonize the vessels. The left uterine vessels were then clamped, cauterized and ligated with the ligasure device. There was some generalized oozing during lysis of adhesions that stopped during the case.    The right tube was separated from the uterus using the ligasure device and removed through the midline trocar. The right uterine ovarian ligament was cauterized and cut with the ligasure device. The right round ligament was cauterized and cut with the ligasure device and the anterior and posterior leafs of the broad ligament were taken down with the ligasure device. The harmonic scalpel was then used to take down the bladder flap and skeltonize the vessels. The right uterine vessels were then clamped, cauterized and ligated with the ligasure device. Hemostasis was excellent.   Using the rumi manipulator the uterus was pushed up in the pelvic cavity and the harmonic scalpel was used to separate the cervix from the vagina using the harmonic energy. The uterus was  Removed intact vaginally at this time. An occluder was placed in the vagina to maintain pneumoperitoneum. The vaginal cuff was then closed with a 0 V-lock suture. Hemostasis was excellent. The abdominal pelvic cavity was irrigated and suctioned dry. Pressure was released and hemostasis remained excellent.   The abdominal cavity was desufflated and the trocars were removed. There was some bleeding from the LLQ trocar site that was stopped with a deep stitch of 4-0 Vicryl and cautery. The skin was closed with subcuticular stiches of 4-0 vicryl and  dermabond was placed over the incisions.  The foley catheter was removed and cystoscopy was performed using a 70 degree scope. Both ureters expelled urine, no bladder abnormalities were noted. The bladder was allowed to drain and the cystoscope was removed.   The patient's abdomen and perineum were cleansed and she was taken out of the dorsal lithotomy position. Upon awakening she was extubated and taken to the recovery room in stable condition. The sponge and instrument counts were correct.   Operative time was over 4 hours, well over 2 hours spent with extensive lysis of adhesions.

## 2019-08-19 NOTE — Op Note (Signed)
08/19/2019  9:17 AM  PATIENT:  Meredith Lee  43 y.o. female  PRE-OPERATIVE DIAGNOSIS:  Fibroid uterus, AUB, worsening dysmenorrhea, prev abdominal myomentomy  POST-OPERATIVE DIAGNOSIS:  Fibroid uterus, AUB, worsening dysmenorrhea, prev abdominal myomentomy, adhesions  PROCEDURE:  Procedure(s): LYSIS OF ADHESION (N/A)  SURGEON:  Surgeon(s) and Role:      Jovita Kussmaul, MD  PHYSICIAN ASSISTANT:   ASSISTANTS: none   ANESTHESIA:   general  EBL:  25 mL   BLOOD ADMINISTERED:none  DRAINS: none   LOCAL MEDICATIONS USED:  NONE  SPECIMEN:  No Specimen  DISPOSITION OF SPECIMEN:  N/A  COUNTS:  YES  TOURNIQUET:  * No tourniquets in log *  DICTATION: .Dragon Dictation   I was called for an intraoperative consult for adhesions during a laparoscopic hysterectomy.  The patient appeared to have a small segment of sigmoid colon that was fairly densely adherent to the uterus.  I was able to separate most of this by blunt dissection with the sucker tip.  The last small bit of colon was very densely stuck in one small point to the wall of the uterus.  I was able to separate this leaving a small cuff of uterus on the sigmoid colon sharply with laparoscopic scissors.  Once this was accomplished the sigmoid colon was completely free from the uterus.  The sigmoid colon was inspected and appeared to be healthy with no apparent injury.  The patient tolerated this portion of the procedure well.  At this point the operation was turned back over to the gynecologist for the hysterectomy.  PLAN OF CARE: Discharge to home after PACU  PATIENT DISPOSITION:  PACU - hemodynamically stable.   Delay start of Pharmacological VTE agent (>24hrs) due to surgical blood loss or risk of bleeding: not applicable

## 2019-08-19 NOTE — Interval H&P Note (Signed)
History and Physical Interval Note:  08/19/2019 7:13 AM  Meredith Lee  has presented today for surgery, with the diagnosis of Fibroid uterus, AUB, worsening dysmenorrhea, prev abdominal myomentomy.  The various methods of treatment have been discussed with the patient and family. After consideration of risks, benefits and other options for treatment, the patient has consented to  Procedure(s) with comments: TOTAL LAPAROSCOPIC HYSTERECTOMY WITH SALPINGECTOMY (Bilateral) - 2.5 hours surgery time. Extended recovery bed needed CYSTOSCOPY (N/A) as a surgical intervention.  The patient's history has been reviewed, patient examined, no change in status, stable for surgery.  I have reviewed the patient's chart and labs.  Questions were answered to the patient's satisfaction.   We discussed possible removal of ovaries.   Salvadore Dom

## 2019-08-19 NOTE — Transfer of Care (Signed)
Immediate Anesthesia Transfer of Care Note  Patient: Meredith Lee  Procedure(s) Performed: Procedure(s) (LRB): TOTAL LAPAROSCOPIC HYSTERECTOMY WITH BILATERAL SALPINGECTOMY,LEFT OOPHORECTOMY, EXTENSIVE LYSIS OF ADHESIONS (Bilateral) CYSTOSCOPY (N/A) LYSIS OF ADHESION (N/A)  Patient Location: PACU  Anesthesia Type: General  Level of Consciousness: awake, sedated, patient cooperative and responds to stimulation  Airway & Oxygen Therapy: Patient Spontanous Breathing and Patient connected to face mask oxygen  Post-op Assessment: Report given to PACU RN, Post -op Vital signs reviewed and stable and Patient moving all extremities  Post vital signs: Reviewed and stable  Complications: No apparent anesthesia complications

## 2019-08-19 NOTE — Progress Notes (Signed)
Day of Surgery Procedure(s) (LRB): TOTAL LAPAROSCOPIC HYSTERECTOMY WITH BILATERAL SALPINGECTOMY,LEFT OOPHORECTOMY, EXTENSIVE LYSIS OF ADHESIONS (Bilateral) CYSTOSCOPY (N/A) LYSIS OF ADHESION (N/A)  Subjective: Patient's pain is well controlled, just got up and voided for the first time. No flatus.   Objective: I have reviewed patient's vital signs, intake and output and labs.  Today's Vitals   08/19/19 1400 08/19/19 1410 08/19/19 1430 08/19/19 1605  BP:  110/74 110/72 110/75  Pulse: 86 88 81 87  Resp: 13 16 15 16   Temp:  (!) 97.5 F (36.4 C) 98.5 F (36.9 C) (!) 97.3 F (36.3 C)  TempSrc:      SpO2: 99% 98% 100% 100%  Weight:      Height:      PainSc:  3   2    No intake/output data recorded. Total I/O In: 3000 [I.V.:3000] Out: 1125 [Urine:825; Blood:300] Just voided between 400-500 cc (still in the hat)  CBC Latest Ref Rng & Units 08/19/2019 08/16/2019 06/13/2019  WBC 4.0 - 10.5 K/uL 10.9(H) 3.4(L) 3.5  Hemoglobin 12.0 - 15.0 g/dL 13.5 13.1 12.9  Hematocrit 36.0 - 46.0 % 41.8 39.2 38.2  Platelets 150 - 400 K/uL 217 235 247     General: alert, cooperative and no distress Resp: clear to auscultation bilaterally Cardio: S1, S2 normal GI: soft, not tender to gentle palpation, mildly distended, +BS. Incisions: clean, dry and intact Extremities: extremities normal, atraumatic, no cyanosis or edema  Assessment: s/p Procedure(s): TOTAL LAPAROSCOPIC HYSTERECTOMY WITH BILATERAL SALPINGECTOMY,LEFT OOPHORECTOMY, EXTENSIVE LYSIS OF ADHESIONS (Bilateral) CYSTOSCOPY (N/A) LYSIS OF ADHESION (N/A): stable and progressing well  Plan: Advance diet Encourage ambulation  LOS: 0 days    Salvadore Dom 08/19/2019, 5:16 PM

## 2019-08-20 ENCOUNTER — Encounter (HOSPITAL_BASED_OUTPATIENT_CLINIC_OR_DEPARTMENT_OTHER): Payer: Self-pay | Admitting: Obstetrics and Gynecology

## 2019-08-20 DIAGNOSIS — D259 Leiomyoma of uterus, unspecified: Secondary | ICD-10-CM | POA: Diagnosis not present

## 2019-08-20 LAB — CBC
HCT: 30.2 % — ABNORMAL LOW (ref 36.0–46.0)
Hemoglobin: 10.2 g/dL — ABNORMAL LOW (ref 12.0–15.0)
MCH: 33.4 pg (ref 26.0–34.0)
MCHC: 33.4 g/dL (ref 30.0–36.0)
MCV: 100 fL (ref 80.0–100.0)
Platelets: 194 10*3/uL (ref 150–400)
RBC: 3.02 MIL/uL — ABNORMAL LOW (ref 3.87–5.11)
RDW: 12.2 % (ref 11.5–15.5)
WBC: 7.5 10*3/uL (ref 4.0–10.5)
nRBC: 0 % (ref 0.0–0.2)

## 2019-08-20 LAB — SURGICAL PATHOLOGY

## 2019-08-20 MED ORDER — OXYCODONE HCL 5 MG PO TABS: 5.0000 mg | ORAL_TABLET | ORAL | 0 refills | Status: DC | PRN

## 2019-08-20 MED ORDER — IBUPROFEN 800 MG PO TABS: 800.0000 mg | ORAL_TABLET | Freq: Three times a day (TID) | ORAL | 0 refills | Status: DC | PRN

## 2019-08-20 MED ORDER — KETOROLAC TROMETHAMINE 30 MG/ML IJ SOLN
INTRAMUSCULAR | Status: AC
  Filled 2019-08-20: qty 1

## 2019-08-20 MED ORDER — ACETAMINOPHEN 500 MG PO TABS: 1000.0000 mg | ORAL_TABLET | Freq: Four times a day (QID) | ORAL | 0 refills | Status: DC

## 2019-08-20 MED ORDER — DOCUSATE SODIUM 100 MG PO CAPS: 100.0000 mg | ORAL_CAPSULE | Freq: Two times a day (BID) | ORAL | 0 refills | Status: DC

## 2019-08-20 MED ORDER — ACETAMINOPHEN 500 MG PO TABS
ORAL_TABLET | ORAL | Status: AC
  Filled 2019-08-20: qty 2

## 2019-08-20 NOTE — Discharge Summary (Signed)
Physician Discharge Summary  Patient ID: Meredith Lee MRN: [id number] DOB/AGE: [birth year] 43 y.o.  Admit date: 08/19/2019 Discharge date: 08/20/2019  Admission Diagnoses: fibroid uterus, abnormal uterine bleeding severe dysmenorrhea  Discharge Diagnoses:  Active Problems:   Status post laparoscopic hysterectomy with extensive lysis of adhesions, left salpingo-oophorectomy, right salpingectomy  Discharged Condition: good  Hospital Course: uncomplicated  Consults: general surgery  Significant Diagnostic Studies: labs:  CBC Latest Ref Rng & Units 08/20/2019 08/19/2019 08/16/2019  WBC 4.0 - 10.5 K/uL 7.5 10.9(H) 3.4(L)  Hemoglobin 12.0 - 15.0 g/dL 10.2(L) 13.5 13.1  Hematocrit 36.0 - 46.0 % 30.2(L) 41.8 39.2  Platelets 150 - 400 K/uL 194 217 235     Treatments: surgery: TLH/LSO/RS/LOA/cystoscopy  Today's Vitals   08/20/19 0128 08/20/19 0131 08/20/19 0356 08/20/19 0530  BP: (!) 85/62 (!) 87/56 (!) 88/54 (!) 85/64  Pulse: 90   91  Resp: 16   16  Temp: 98.8 F (37.1 C)   98.6 F (37 C)  TempSrc:      SpO2: 100%   98%  Weight:      Height:      PainSc: 3   3  3     I/O last 3 completed shifts: In: 4000 [P.O.:1000; I.V.:3000] Out: 4825 [Urine:4525; Blood:300] No intake/output data recorded.     Discharge Exam: Blood pressure (!) 85/64, pulse 91, temperature 98.6 F (37 C), resp. rate 16, height 5\' 3"  (1.6 m), weight 59.2 kg, last menstrual period 07/15/2019, SpO2 98 %. General appearance: alert, cooperative and no distress Resp: clear to auscultation bilaterally Cardio: S1, S2 normal GI: soft, non-tender; bowel sounds normal; no masses,  no organomegaly Extremities: extremities normal, atraumatic, no cyanosis or edema  Incisions: clean, dry and intact without erythema.   Disposition: Discharge disposition: 01-Home or Self Care       Discharge Instructions    Call MD for:   Complete by: As directed    Heavy vaginal bleeding   Call MD for:   difficulty breathing, headache or visual disturbances   Complete by: As directed    Call MD for:  extreme fatigue   Complete by: As directed    Call MD for:  hives   Complete by: As directed    Call MD for:  persistant dizziness or light-headedness   Complete by: As directed    Call MD for:  persistant nausea and vomiting   Complete by: As directed    Call MD for:  redness, tenderness, or signs of infection (pain, swelling, redness, odor or green/yellow discharge around incision site)   Complete by: As directed    Call MD for:  severe uncontrolled pain   Complete by: As directed    Call MD for:  temperature >100.4   Complete by: As directed    Diet general   Complete by: As directed    Driving Restrictions   Complete by: As directed    No driving while taking oxycodone. You need to be able to slam on the brakes to drive.   Increase activity slowly   Complete by: As directed    May shower / Bathe   Complete by: As directed    Can shower this afternoon, can take a bath in 1-2 weeks   No dressing needed   Complete by: As directed    Other Restrictions   Complete by: As directed    You can resume your multivitamins once you are having normal bowel movements.   Sexual Activity Restrictions  Complete by: As directed    No intercourse x 12 weeks     Allergies as of 08/20/2019      Reactions   Flagyl [metronidazole] Anaphylaxis   Shellfish Allergy Diarrhea      Medication List    STOP taking these medications   PRE-NATAL PO     TAKE these medications   acetaminophen 500 MG tablet Commonly known as: TYLENOL Take 2 tablets (1,000 mg total) by mouth every 6 (six) hours.   diphenhydramine-acetaminophen 25-500 MG Tabs tablet Commonly known as: TYLENOL PM Take 1 tablet by mouth at bedtime.   docusate sodium 100 MG capsule Commonly known as: COLACE Take 1 capsule (100 mg total) by mouth 2 (two) times daily.   doxylamine (Sleep) 25 MG tablet Commonly known as:  UNISOM Take 25 mg by mouth at bedtime as needed.   ibuprofen 800 MG tablet Commonly known as: ADVIL Take 1 tablet (800 mg total) by mouth every 8 (eight) hours as needed.   MAGNESIUM PO Take 500 mg by mouth daily.   miconazole 2 % cream Commonly known as: MICOTIN Apply 1 application topically daily.   oxyCODONE 5 MG immediate release tablet Commonly known as: Oxy IR/ROXICODONE Take 1-2 tablets (5-10 mg total) by mouth every 4 (four) hours as needed for moderate pain.   VITAMIN D PO Take 5,000 Units by mouth daily.        Signed: Salvadore Dom 08/20/2019, 7:04 AM

## 2019-08-20 NOTE — Discharge Instructions (Signed)

## 2019-08-20 NOTE — Anesthesia Postprocedure Evaluation (Signed)
Anesthesia Post Note  Patient: Logyn Woolridge  Procedure(s) Performed: TOTAL LAPAROSCOPIC HYSTERECTOMY WITH BILATERAL SALPINGECTOMY,LEFT OOPHORECTOMY, EXTENSIVE LYSIS OF ADHESIONS (Bilateral Abdomen) CYSTOSCOPY (N/A Bladder) LYSIS OF ADHESION (N/A Abdomen)     Patient location during evaluation: PACU Anesthesia Type: General Level of consciousness: awake and alert Pain management: pain level controlled Vital Signs Assessment: post-procedure vital signs reviewed and stable Respiratory status: spontaneous breathing, nonlabored ventilation, respiratory function stable and patient connected to nasal cannula oxygen Cardiovascular status: blood pressure returned to baseline and stable Postop Assessment: no apparent nausea or vomiting Anesthetic complications: no    Last Vitals:  Vitals:   08/20/19 0530 08/20/19 0817  BP: (!) 85/64 103/68  Pulse: 91 96  Resp: 16 16  Temp: 37 C 36.6 C  SpO2: 98% 100%    Last Pain:  Vitals:   08/20/19 0839  TempSrc:   PainSc: 3    Pain Goal: Patients Stated Pain Goal: 6 (08/20/19 0530)                 Audry Pili

## 2019-08-22 NOTE — Progress Notes (Deleted)
GYNECOLOGY  VISIT   HPI: 43 y.o.   Single Black or African American Not Hispanic or Latino  female   G0P0000 with No LMP recorded. (Menstrual status: Irregular Periods).   here for     GYNECOLOGIC HISTORY: No LMP recorded. (Menstrual status: Irregular Periods). Contraception:*** Menopausal hormone therapy: ***        OB History    Gravida  0   Para  0   Term  0   Preterm  0   AB  0   Living  0     SAB  0   TAB  0   Ectopic  0   Multiple  0   Live Births  0              Patient Active Problem List   Diagnosis Date Noted  . Status post laparoscopic hysterectomy 08/19/2019  . Genetic testing 08/12/2019  . Family history of breast cancer   . Family history of colon cancer     Past Medical History:  Diagnosis Date  . Amenorrhea   . Depression   . Family history of breast cancer   . Family history of colon cancer   . Fibroid   . Heart murmur    as a child  . Menstrual cramps   . Ovarian cyst     Past Surgical History:  Procedure Laterality Date  . CYSTOSCOPY N/A 08/19/2019   Procedure: CYSTOSCOPY;  Surgeon: Salvadore Dom, MD;  Location: [facility];  Service: Gynecology;  Laterality: N/A;  . LYSIS OF ADHESION N/A 08/19/2019   Procedure: LYSIS OF ADHESION;  Surgeon: Salvadore Dom, MD;  Location: [facility];  Service: Gynecology;  Laterality: N/A;  . MYOMECTOMY  2012  . TOTAL LAPAROSCOPIC HYSTERECTOMY WITH SALPINGECTOMY Bilateral 08/19/2019   Procedure: TOTAL LAPAROSCOPIC HYSTERECTOMY WITH BILATERAL SALPINGECTOMY,LEFT OOPHORECTOMY, EXTENSIVE LYSIS OF ADHESIONS;  Surgeon: Salvadore Dom, MD;  Location: [place];  Service: Gynecology;  Laterality: Bilateral;    Current Outpatient Medications  Medication Sig Dispense Refill  . acetaminophen (TYLENOL) 500 MG tablet Take 2 tablets (1,000 mg total) by mouth every 6 (six) hours. 30 tablet 0  . diphenhydramine-acetaminophen (TYLENOL PM)  25-500 MG TABS tablet Take 1 tablet by mouth at bedtime.    . docusate sodium (COLACE) 100 MG capsule Take 1 capsule (100 mg total) by mouth 2 (two) times daily. 30 capsule 0  . doxylamine, Sleep, (UNISOM) 25 MG tablet Take 25 mg by mouth at bedtime as needed.    Marland Kitchen ibuprofen (ADVIL) 800 MG tablet Take 1 tablet (800 mg total) by mouth every 8 (eight) hours as needed. 30 tablet 0  . MAGNESIUM PO Take 500 mg by mouth daily.     . miconazole (MICOTIN) 2 % cream Apply 1 application topically daily.    Marland Kitchen oxyCODONE (OXY IR/ROXICODONE) 5 MG immediate release tablet Take 1-2 tablets (5-10 mg total) by mouth every 4 (four) hours as needed for moderate pain. 30 tablet 0  . VITAMIN D PO Take 5,000 Units by mouth daily.      No current facility-administered medications for this visit.      ALLERGIES: Flagyl [metronidazole] and Shellfish allergy  Family History  Problem Relation Age of Onset  . Fibroids Mother   . Diabetes Father   . Fibroids Sister   . Colon cancer Maternal Grandmother   . Lung cancer Maternal Grandmother   . Breast cancer Maternal Grandmother  dx <50  . Cancer Maternal Grandfather   . Breast cancer Paternal Grandmother 30  . Heart attack Paternal Grandmother   . Breast cancer Paternal Aunt 29       recurrance at 74  . Brain cancer Paternal Aunt   . Obesity Maternal Aunt   . Diabetes Maternal Aunt   . Heart failure Maternal Aunt   . Heart disease Paternal Uncle     Social History   Socioeconomic History  . Marital status: Single    Spouse name: Not on file  . Number of children: Not on file  . Years of education: Not on file  . Highest education level: Not on file  Occupational History  . Not on file  Social Needs  . Financial resource strain: Not on file  . Food insecurity    Worry: Not on file    Inability: Not on file  . Transportation needs    Medical: Not on file    Non-medical: Not on file  Tobacco Use  . Smoking status: Former Smoker    Types:  Cigars  . Smokeless tobacco: Never Used  Substance and Sexual Activity  . Alcohol use: Yes    Comment: rarely  . Drug use: No  . Sexual activity: Not Currently    Birth control/protection: None    Comment: female partner  Lifestyle  . Physical activity    Days per week: Not on file    Minutes per session: Not on file  . Stress: Not on file  Relationships  . Social Herbalist on phone: Not on file    Gets together: Not on file    Attends religious service: Not on file    Active member of club or organization: Not on file    Attends meetings of clubs or organizations: Not on file    Relationship status: Not on file  . Intimate partner violence    Fear of current or ex partner: Not on file    Emotionally abused: Not on file    Physically abused: Not on file    Forced sexual activity: Not on file  Other Topics Concern  . Not on file  Social History Narrative  . Not on file    ROS  PHYSICAL EXAMINATION:    There were no vitals taken for this visit.    General appearance: alert, cooperative and appears stated age Neck: no adenopathy, supple, symmetrical, trachea midline and thyroid {CHL AMB PHY EX THYROID NORM DEFAULT:[phone]::"normal to inspection and palpation"} Breasts: {Exam; breast:13139::"normal appearance, no masses or tenderness"} Abdomen: soft, non-tender; non distended, no masses,  no organomegaly  Pelvic: External genitalia:  no lesions              Urethra:  normal appearing urethra with no masses, tenderness or lesions              Bartholins and Skenes: normal                 Vagina: normal appearing vagina with normal color and discharge, no lesions              Cervix: {CHL AMB PHY EX CERVIX NORM DEFAULT:[phone]::"no lesions"}              Bimanual Exam:  Uterus:  {CHL AMB PHY EX UTERUS NORM DEFAULT:[phone]::"normal size, contour, position, consistency, mobility, non-tender"}              Adnexa: {CHL AMB PHY EX ADNEXA NO  MASS  [phone]::"no mass, fullness, tenderness"}              Rectovaginal: {yes no:[id number]}.  Confirms.              Anus:  normal sphincter tone, no lesions  Chaperone was present for exam.  ASSESSMENT     PLAN    An After Visit Summary was printed and given to the patient.  *** minutes face to face time of which over 50% was spent in counseling.

## 2019-08-23 ENCOUNTER — Telehealth: Payer: Self-pay | Admitting: Obstetrics and Gynecology

## 2019-08-23 ENCOUNTER — Other Ambulatory Visit: Payer: Self-pay

## 2019-08-23 NOTE — Telephone Encounter (Signed)
Covid pre-screen positive. States she has had a bad cough since surgery 08/19/19. She believes it is from irritation due to the tube. She would like to know if it is ok to take robitussin with all of her other medications. Patient coming in for post op 10/20.

## 2019-08-23 NOTE — Telephone Encounter (Signed)
Call to patient. Patient states she has had a dry cough since her surgery. Knows it is irritation from the breathing tube. Denies nasal congestion, fever or chills. Asking if she can take robitussin to suppress the cough? RN advised would need to review with Dr. Talbert Nan. Patient advised could try lozenges to help with irritation. Patient states she has tried Ricola drops with little relief. Advised would review with Dr. Talbert Nan and return call with recommendations. Patient agreeable.   Routing to provider for review.

## 2019-08-23 NOTE — Telephone Encounter (Signed)
Call to patient. Advised patient reviewed with Dr. Talbert Nan and patient okay to take robitussin prn for cough. Patient verbalized understanding and appreciative of phone call.   Routing to provider and will close encounter.

## 2019-08-26 NOTE — Progress Notes (Signed)
GYNECOLOGY  VISIT   HPI: 43 y.o.   Single Black or African American Not Hispanic or Latino  female   G0P0000 with Patient's last menstrual period was 07/15/2019.   here for post op appointment. She is 8 days s/p TLH/LSO/RS/extensive LOA and cystoscopy. She had significant adhesions of her bowel to her uterus and left adnexa.  Pathology with fibroids, endometriosis of the uterine serosa, benign.  She is doing well. BM are a little loose, up to 2 x a day. Typically her BM's are a little more solid. Bladder working well. No real pain, sometimes has a slight discomfort around her left side incision. She is taking Ibuprofen and tylenol during the day and an oxycodone prior to bed.  She is tired and napping a lot.   GYNECOLOGIC HISTORY: Patient's last menstrual period was 07/15/2019. Contraception: Hysterectomy Menopausal hormone therapy: None        OB History    Gravida  0   Para  0   Term  0   Preterm  0   AB  0   Living  0     SAB  0   TAB  0   Ectopic  0   Multiple  0   Live Births  0              Patient Active Problem List   Diagnosis Date Noted  . Status post laparoscopic hysterectomy 08/19/2019  . Genetic testing 08/12/2019  . Family history of breast cancer   . Family history of colon cancer     Past Medical History:  Diagnosis Date  . Amenorrhea   . Depression   . Family history of breast cancer   . Family history of colon cancer   . Fibroid   . Heart murmur    as a child  . Menstrual cramps   . Ovarian cyst     Past Surgical History:  Procedure Laterality Date  . ABDOMINAL HYSTERECTOMY    . CYSTOSCOPY N/A 08/19/2019   Procedure: CYSTOSCOPY;  Surgeon: Salvadore Dom, MD;  Location: [facility];  Service: Gynecology;  Laterality: N/A;  . LYSIS OF ADHESION N/A 08/19/2019   Procedure: LYSIS OF ADHESION;  Surgeon: Salvadore Dom, MD;  Location: [facility];  Service: Gynecology;  Laterality: N/A;  .  MYOMECTOMY  2012  . TOTAL LAPAROSCOPIC HYSTERECTOMY WITH SALPINGECTOMY Bilateral 08/19/2019   Procedure: TOTAL LAPAROSCOPIC HYSTERECTOMY WITH BILATERAL SALPINGECTOMY,LEFT OOPHORECTOMY, EXTENSIVE LYSIS OF ADHESIONS;  Surgeon: Salvadore Dom, MD;  Location: [place];  Service: Gynecology;  Laterality: Bilateral;    Current Outpatient Medications  Medication Sig Dispense Refill  . acetaminophen (TYLENOL) 500 MG tablet Take 2 tablets (1,000 mg total) by mouth every 6 (six) hours. 30 tablet 0  . diphenhydramine-acetaminophen (TYLENOL PM) 25-500 MG TABS tablet Take 1 tablet by mouth at bedtime.    . docusate sodium (COLACE) 100 MG capsule Take 1 capsule (100 mg total) by mouth 2 (two) times daily. 30 capsule 0  . doxylamine, Sleep, (UNISOM) 25 MG tablet Take 25 mg by mouth at bedtime as needed.    Marland Kitchen ibuprofen (ADVIL) 800 MG tablet Take 1 tablet (800 mg total) by mouth every 8 (eight) hours as needed. 30 tablet 0  . MAGNESIUM PO Take 500 mg by mouth daily.     . miconazole (MICOTIN) 2 % cream Apply 1 application topically daily.    Marland Kitchen oxyCODONE (OXY IR/ROXICODONE) 5 MG immediate release tablet Take  1-2 tablets (5-10 mg total) by mouth every 4 (four) hours as needed for moderate pain. 30 tablet 0  . VITAMIN D PO Take 5,000 Units by mouth daily.      No current facility-administered medications for this visit.      ALLERGIES: Flagyl [metronidazole] and Shellfish allergy  Family History  Problem Relation Age of Onset  . Fibroids Mother   . Diabetes Father   . Fibroids Sister   . Colon cancer Maternal Grandmother   . Lung cancer Maternal Grandmother   . Breast cancer Maternal Grandmother        dx <50  . Cancer Maternal Grandfather   . Breast cancer Paternal Grandmother 35  . Heart attack Paternal Grandmother   . Breast cancer Paternal Aunt 19       recurrance at 59  . Brain cancer Paternal Aunt   . Obesity Maternal Aunt   . Diabetes Maternal Aunt   . Heart failure  Maternal Aunt   . Heart disease Paternal Uncle     Social History   Socioeconomic History  . Marital status: Single    Spouse name: Not on file  . Number of children: Not on file  . Years of education: Not on file  . Highest education level: Not on file  Occupational History  . Not on file  Social Needs  . Financial resource strain: Not on file  . Food insecurity    Worry: Not on file    Inability: Not on file  . Transportation needs    Medical: Not on file    Non-medical: Not on file  Tobacco Use  . Smoking status: Former Smoker    Types: Cigars  . Smokeless tobacco: Never Used  Substance and Sexual Activity  . Alcohol use: Yes    Comment: rarely  . Drug use: No  . Sexual activity: Not Currently    Birth control/protection: None    Comment: female partner  Lifestyle  . Physical activity    Days per week: Not on file    Minutes per session: Not on file  . Stress: Not on file  Relationships  . Social Herbalist on phone: Not on file    Gets together: Not on file    Attends religious service: Not on file    Active member of club or organization: Not on file    Attends meetings of clubs or organizations: Not on file    Relationship status: Not on file  . Intimate partner violence    Fear of current or ex partner: Not on file    Emotionally abused: Not on file    Physically abused: Not on file    Forced sexual activity: Not on file  Other Topics Concern  . Not on file  Social History Narrative  . Not on file    Review of Systems  Constitutional: Negative.   HENT: Negative.   Eyes: Negative.   Respiratory: Negative.   Cardiovascular: Negative.   Gastrointestinal: Negative.   Genitourinary: Negative.   Musculoskeletal: Negative.   Skin: Negative.   Neurological: Negative.   Endo/Heme/Allergies: Negative.   Psychiatric/Behavioral: Negative.     PHYSICAL EXAMINATION:    BP 108/68 (BP Location: Right Arm, Patient Position: Sitting, Cuff Size:  Normal)   Pulse 76   Temp (!) 97.4 F (36.3 C) (Skin)   Wt 128 lb (58.1 kg)   LMP 07/15/2019   BMI 22.67 kg/m     General appearance: alert,  cooperative and appears stated age Abdomen: soft, non-tender; mildly distended, no masses,  no organomegaly Incisions: slight induration around her left abdominal incision (known hematoma). No erythema, drainage around any of her incisions.   Photos from surgery reviewed with the patient.   ASSESSMENT 8 days s/p TLH/LSO/RS/extensive LOA. She is doing very well.     PLAN F/U in 3 weeks, call with any questions.   An After Visit Summary was printed and given to the patient.

## 2019-08-27 ENCOUNTER — Other Ambulatory Visit: Payer: Self-pay

## 2019-08-27 ENCOUNTER — Ambulatory Visit: Payer: BC Managed Care – PPO | Admitting: Obstetrics and Gynecology

## 2019-08-27 ENCOUNTER — Ambulatory Visit (INDEPENDENT_AMBULATORY_CARE_PROVIDER_SITE_OTHER): Payer: BC Managed Care – PPO | Admitting: Obstetrics and Gynecology

## 2019-08-27 ENCOUNTER — Encounter: Payer: Self-pay | Admitting: Obstetrics and Gynecology

## 2019-08-27 VITALS — BP 108/68 | HR 76 | Temp 97.4°F | Wt 128.0 lb

## 2019-08-27 DIAGNOSIS — Z90710 Acquired absence of both cervix and uterus: Secondary | ICD-10-CM

## 2019-09-03 ENCOUNTER — Telehealth: Payer: Self-pay | Admitting: Obstetrics and Gynecology

## 2019-09-03 NOTE — Telephone Encounter (Signed)
Left message to call back to triage nurse at [facility].

## 2019-09-03 NOTE — Telephone Encounter (Signed)
Call to patient. Patient states that she has noticed since her post op appointment on 10-20 that she has been feeling an "uncomfortable pressure" at the end of her urine stream. Denies other urinary symptoms, but states she has had some slight discharge and some itching that feels "like everything is healing." Denies fever or pelvic pain (outside of the "normal" post op pain). States she is having regular bowel movements and staying hydrated. RN advised OV recommended for further evaluation. Patient requesting Friday appointment, RN advised would need to be seen sooner than Friday. Patient requesting am appointment. Patient scheduled for 09-04-2019 at 0815. Patient agreeable to date and time of appointment. Pain/fever precautions reviewed with patient and she verbalized understanding.   Routing to provider and will close encounter.

## 2019-09-03 NOTE — Telephone Encounter (Signed)
Patient returning call to nurse  

## 2019-09-03 NOTE — Progress Notes (Signed)
GYNECOLOGY  VISIT   HPI: 43 y.o.   Single Black or African American Not Hispanic or Latino  female   G0P0000 with Patient's last menstrual period was 07/15/2019.   here for bladder pressure at the end of urination and vaginal discharge. She is 2 weeks s/p TLH/LSO/RS/extensive LOA and cystoscopy. Since her visit last week, she started noticing some painful pressure at the end of urination, going on for a week, not getting worse. No urinary frequency or urgency.  Last Wednesday she had her first solid BM, since then she is having ~3 solid, soft BM's a day, no pain.  Slight abdominal discomfort, getting better. Not on pain meds.  She had 2 hot flashes yesterday, no fevers.  She has had slight leaking around her left lower quadrant incision.  Last night she noticed a small amount of mucous vaginal d/c, off white, no odor. No itching, burning or irritation.    GYNECOLOGIC HISTORY: Patient's last menstrual period was 07/15/2019. Contraception: Hysterectomy Menopausal hormone therapy: None        OB History    Gravida  0   Para  0   Term  0   Preterm  0   AB  0   Living  0     SAB  0   TAB  0   Ectopic  0   Multiple  0   Live Births  0              Patient Active Problem List   Diagnosis Date Noted  . Status post laparoscopic hysterectomy 08/19/2019  . Genetic testing 08/12/2019  . Family history of breast cancer   . Family history of colon cancer     Past Medical History:  Diagnosis Date  . Amenorrhea   . Depression   . Family history of breast cancer   . Family history of colon cancer   . Fibroid   . Heart murmur    as a child  . Menstrual cramps   . Ovarian cyst     Past Surgical History:  Procedure Laterality Date  . ABDOMINAL HYSTERECTOMY    . CYSTOSCOPY N/A 08/19/2019   Procedure: CYSTOSCOPY;  Surgeon: Salvadore Dom, MD;  Location: [facility];  Service: Gynecology;  Laterality: N/A;  . LYSIS OF ADHESION N/A 08/19/2019    Procedure: LYSIS OF ADHESION;  Surgeon: Salvadore Dom, MD;  Location: [facility];  Service: Gynecology;  Laterality: N/A;  . MYOMECTOMY  2012  . TOTAL LAPAROSCOPIC HYSTERECTOMY WITH SALPINGECTOMY Bilateral 08/19/2019   Procedure: TOTAL LAPAROSCOPIC HYSTERECTOMY WITH BILATERAL SALPINGECTOMY,LEFT OOPHORECTOMY, EXTENSIVE LYSIS OF ADHESIONS;  Surgeon: Salvadore Dom, MD;  Location: [facility];  Service: Gynecology;  Laterality: Bilateral;    Current Outpatient Medications  Medication Sig Dispense Refill  . diphenhydramine-acetaminophen (TYLENOL PM) 25-500 MG TABS tablet Take 1 tablet by mouth at bedtime.    Marland Kitchen doxylamine, Sleep, (UNISOM) 25 MG tablet Take 25 mg by mouth at bedtime as needed.    Marland Kitchen ibuprofen (ADVIL) 800 MG tablet Take 1 tablet (800 mg total) by mouth every 8 (eight) hours as needed. 30 tablet 0  . MAGNESIUM PO Take 500 mg by mouth daily.     . miconazole (MICOTIN) 2 % cream Apply 1 application topically daily.    Marland Kitchen VITAMIN D PO Take 5,000 Units by mouth daily.      No current facility-administered medications for this visit.      ALLERGIES: Flagyl [metronidazole]  and Shellfish allergy  Family History  Problem Relation Age of Onset  . Fibroids Mother   . Diabetes Father   . Fibroids Sister   . Colon cancer Maternal Grandmother   . Lung cancer Maternal Grandmother   . Breast cancer Maternal Grandmother        dx <50  . Cancer Maternal Grandfather   . Breast cancer Paternal Grandmother 64  . Heart attack Paternal Grandmother   . Breast cancer Paternal Aunt 73       recurrance at 68  . Brain cancer Paternal Aunt   . Obesity Maternal Aunt   . Diabetes Maternal Aunt   . Heart failure Maternal Aunt   . Heart disease Paternal Uncle     Social History   Socioeconomic History  . Marital status: Single    Spouse name: Not on file  . Number of children: Not on file  . Years of education: Not on file  . Highest education level:  Not on file  Occupational History  . Not on file  Social Needs  . Financial resource strain: Not on file  . Food insecurity    Worry: Not on file    Inability: Not on file  . Transportation needs    Medical: Not on file    Non-medical: Not on file  Tobacco Use  . Smoking status: Former Smoker    Types: Cigars  . Smokeless tobacco: Never Used  Substance and Sexual Activity  . Alcohol use: Yes    Comment: rarely  . Drug use: No  . Sexual activity: Not Currently    Birth control/protection: None    Comment: female partner  Lifestyle  . Physical activity    Days per week: Not on file    Minutes per session: Not on file  . Stress: Not on file  Relationships  . Social Herbalist on phone: Not on file    Gets together: Not on file    Attends religious service: Not on file    Active member of club or organization: Not on file    Attends meetings of clubs or organizations: Not on file    Relationship status: Not on file  . Intimate partner violence    Fear of current or ex partner: Not on file    Emotionally abused: Not on file    Physically abused: Not on file    Forced sexual activity: Not on file  Other Topics Concern  . Not on file  Social History Narrative  . Not on file    Review of Systems  Constitutional: Negative.   HENT: Negative.   Eyes: Negative.  Negative for blurred vision.  Respiratory: Negative.   Cardiovascular: Negative.   Gastrointestinal: Negative.   Genitourinary:       Vaginal discharge Bladder pressure  Musculoskeletal: Negative.   Skin: Negative.   Neurological: Negative.   Endo/Heme/Allergies: Negative.   Psychiatric/Behavioral: Negative.     PHYSICAL EXAMINATION:    BP 104/66 (BP Location: Right Arm, Patient Position: Sitting, Cuff Size: Normal)   Pulse 76   Temp (!) 97.1 F (36.2 C) (Skin)   Wt 125 lb 3.2 oz (56.8 kg)   LMP 07/15/2019   BMI 22.18 kg/m     General appearance: alert, cooperative and appears stated  age Abdomen: soft, non-tender; non distended, no masses,  no organomegaly Incisions are healing well, the left lower quadrant incision is superficially open on the edge, no signs of infection CVA: not  tender   Pelvic: External genitalia:  no lesions              Urethra:  normal appearing urethra with no masses, tenderness or lesions              Bartholins and Skenes: normal                 Vagina: normal appearing vagina with minimal yellow/brown vaginal discharge              Cervix: absent              Bimanual Exam:  Uterus:  uterus absent and vaginal cuff not tender              Adnexa: no mass, fullness, tenderness               Chaperone was present for exam.  Urine dip: trace blood, moderate leuks  Wet prep: no clue, no trich, few wbc KOH: no yeast PH: 4.5 (small amount of blood)   ASSESSMENT Cystitis Vaginal discharge     PLAN Send urine for ua, c&s Treat with bactrim and pyridium Send affirm, treat if positive (h/o flagyl allergy)   An After Visit Summary was printed and given to the patient.  ~20 minutes face to face time of which over 50% was spent in counseling.

## 2019-09-03 NOTE — Telephone Encounter (Signed)
Patient left voicemail overnight stating that she has questions regarding post op care.

## 2019-09-04 ENCOUNTER — Encounter: Payer: Self-pay | Admitting: Obstetrics and Gynecology

## 2019-09-04 ENCOUNTER — Ambulatory Visit (INDEPENDENT_AMBULATORY_CARE_PROVIDER_SITE_OTHER): Payer: BC Managed Care – PPO | Admitting: Obstetrics and Gynecology

## 2019-09-04 ENCOUNTER — Other Ambulatory Visit: Payer: Self-pay

## 2019-09-04 VITALS — BP 104/66 | HR 76 | Temp 97.1°F | Wt 125.2 lb

## 2019-09-04 DIAGNOSIS — N898 Other specified noninflammatory disorders of vagina: Secondary | ICD-10-CM

## 2019-09-04 DIAGNOSIS — R300 Dysuria: Secondary | ICD-10-CM | POA: Diagnosis not present

## 2019-09-04 DIAGNOSIS — R3989 Other symptoms and signs involving the genitourinary system: Secondary | ICD-10-CM

## 2019-09-04 DIAGNOSIS — N3090 Cystitis, unspecified without hematuria: Secondary | ICD-10-CM

## 2019-09-04 LAB — POCT URINALYSIS DIPSTICK
Bilirubin, UA: NEGATIVE
Blood, UA: POSITIVE
Glucose, UA: NEGATIVE
Ketones, UA: NEGATIVE
Nitrite, UA: NEGATIVE
Protein, UA: NEGATIVE
Spec Grav, UA: 1.01 (ref 1.010–1.025)
Urobilinogen, UA: 0.2 E.U./dL
pH, UA: 5 (ref 5.0–8.0)

## 2019-09-04 MED ORDER — SULFAMETHOXAZOLE-TRIMETHOPRIM 800-160 MG PO TABS: 1.0000 | ORAL_TABLET | Freq: Two times a day (BID) | ORAL | 0 refills | Status: DC

## 2019-09-04 MED ORDER — PHENAZOPYRIDINE HCL 200 MG PO TABS: 200.0000 mg | ORAL_TABLET | Freq: Three times a day (TID) | ORAL | 0 refills | Status: DC | PRN

## 2019-09-04 NOTE — Patient Instructions (Signed)

## 2019-09-05 LAB — URINALYSIS, MICROSCOPIC ONLY
Bacteria, UA: NONE SEEN
Casts: NONE SEEN /lpf

## 2019-09-05 LAB — VAGINITIS/VAGINOSIS, DNA PROBE
Candida Species: NEGATIVE
Gardnerella vaginalis: NEGATIVE
Trichomonas vaginosis: NEGATIVE

## 2019-09-06 LAB — URINE CULTURE

## 2019-09-10 ENCOUNTER — Encounter: Payer: Self-pay | Admitting: Family Medicine

## 2019-09-10 ENCOUNTER — Other Ambulatory Visit: Payer: Self-pay

## 2019-09-10 ENCOUNTER — Ambulatory Visit
Admission: RE | Admit: 2019-09-10 | Discharge: 2019-09-10 | Disposition: A | Discharge status: Home / Self Care | Payer: BC Managed Care – PPO | Source: Ambulatory Visit | Attending: Family Medicine | Admitting: Family Medicine

## 2019-09-10 DIAGNOSIS — Z1231 Encounter for screening mammogram for malignant neoplasm of breast: Secondary | ICD-10-CM

## 2019-09-10 LAB — HM MAMMOGRAPHY

## 2019-09-13 ENCOUNTER — Other Ambulatory Visit: Payer: Self-pay

## 2019-09-16 ENCOUNTER — Ambulatory Visit (INDEPENDENT_AMBULATORY_CARE_PROVIDER_SITE_OTHER): Payer: BC Managed Care – PPO | Admitting: Obstetrics and Gynecology

## 2019-09-16 ENCOUNTER — Encounter: Payer: Self-pay | Admitting: Obstetrics and Gynecology

## 2019-09-16 ENCOUNTER — Other Ambulatory Visit: Payer: Self-pay

## 2019-09-16 VITALS — BP 100/68 | HR 72 | Temp 97.0°F | Wt 125.2 lb

## 2019-09-16 DIAGNOSIS — Z90710 Acquired absence of both cervix and uterus: Secondary | ICD-10-CM

## 2019-09-16 NOTE — Progress Notes (Signed)
GYNECOLOGY  VISIT   HPI: 43 y.o.   Single Black or African American Not Hispanic or Latino  female   G0P0000 with Patient's last menstrual period was 07/15/2019.   here for  4 week post op s/p TLH/LSO/RS/extensive LOA and cystoscopy. Slight pulling sensation in her lower abdomen when she is more active. No pain. Bowel and bladder are working fine.   GYNECOLOGIC HISTORY: Patient's last menstrual period was 07/15/2019. Contraception: Hysterectomy Menopausal hormone therapy: None        OB History    Gravida  0   Para  0   Term  0   Preterm  0   AB  0   Living  0     SAB  0   TAB  0   Ectopic  0   Multiple  0   Live Births  0              Patient Active Problem List   Diagnosis Date Noted  . Status post laparoscopic hysterectomy 08/19/2019  . Genetic testing 08/12/2019  . Family history of breast cancer   . Family history of colon cancer     Past Medical History:  Diagnosis Date  . Amenorrhea   . Depression   . Family history of breast cancer   . Family history of colon cancer   . Fibroid   . Heart murmur    as a child  . Menstrual cramps   . Ovarian cyst     Past Surgical History:  Procedure Laterality Date  . ABDOMINAL HYSTERECTOMY    . CYSTOSCOPY N/A 08/19/2019   Procedure: CYSTOSCOPY;  Surgeon: Salvadore Dom, MD;  Location: [facility];  Service: Gynecology;  Laterality: N/A;  . LYSIS OF ADHESION N/A 08/19/2019   Procedure: LYSIS OF ADHESION;  Surgeon: Salvadore Dom, MD;  Location: [facility];  Service: Gynecology;  Laterality: N/A;  . MYOMECTOMY  2012  . TOTAL LAPAROSCOPIC HYSTERECTOMY WITH SALPINGECTOMY Bilateral 08/19/2019   Procedure: TOTAL LAPAROSCOPIC HYSTERECTOMY WITH BILATERAL SALPINGECTOMY,LEFT OOPHORECTOMY, EXTENSIVE LYSIS OF ADHESIONS;  Surgeon: Salvadore Dom, MD;  Location: [facility];  Service: Gynecology;  Laterality: Bilateral;    Current Outpatient Medications   Medication Sig Dispense Refill  . diphenhydramine-acetaminophen (TYLENOL PM) 25-500 MG TABS tablet Take 1 tablet by mouth at bedtime.    Marland Kitchen doxylamine, Sleep, (UNISOM) 25 MG tablet Take 25 mg by mouth at bedtime as needed.    Marland Kitchen ibuprofen (ADVIL) 800 MG tablet Take 1 tablet (800 mg total) by mouth every 8 (eight) hours as needed. 30 tablet 0  . MAGNESIUM PO Take 500 mg by mouth daily.     . miconazole (MICOTIN) 2 % cream Apply 1 application topically daily.    Marland Kitchen VITAMIN D PO Take 5,000 Units by mouth daily.      No current facility-administered medications for this visit.      ALLERGIES: Flagyl [metronidazole] and Shellfish allergy  Family History  Problem Relation Age of Onset  . Fibroids Mother   . Diabetes Father   . Fibroids Sister   . Colon cancer Maternal Grandmother   . Lung cancer Maternal Grandmother   . Breast cancer Maternal Grandmother        dx <50  . Cancer Maternal Grandfather   . Breast cancer Paternal Grandmother 41  . Heart attack Paternal Grandmother   . Breast cancer Paternal Aunt 109       recurrance at 49  .  Brain cancer Paternal Aunt   . Obesity Maternal Aunt   . Diabetes Maternal Aunt   . Heart failure Maternal Aunt   . Heart disease Paternal Uncle     Social History   Socioeconomic History  . Marital status: Single    Spouse name: Not on file  . Number of children: Not on file  . Years of education: Not on file  . Highest education level: Not on file  Occupational History  . Not on file  Social Needs  . Financial resource strain: Not on file  . Food insecurity    Worry: Not on file    Inability: Not on file  . Transportation needs    Medical: Not on file    Non-medical: Not on file  Tobacco Use  . Smoking status: Former Smoker    Types: Cigars  . Smokeless tobacco: Never Used  Substance and Sexual Activity  . Alcohol use: Yes    Comment: rarely  . Drug use: No  . Sexual activity: Not Currently    Birth control/protection: None     Comment: female partner  Lifestyle  . Physical activity    Days per week: Not on file    Minutes per session: Not on file  . Stress: Not on file  Relationships  . Social Herbalist on phone: Not on file    Gets together: Not on file    Attends religious service: Not on file    Active member of club or organization: Not on file    Attends meetings of clubs or organizations: Not on file    Relationship status: Not on file  . Intimate partner violence    Fear of current or ex partner: Not on file    Emotionally abused: Not on file    Physically abused: Not on file    Forced sexual activity: Not on file  Other Topics Concern  . Not on file  Social History Narrative  . Not on file    Review of Systems  Constitutional: Negative.   HENT: Negative.   Eyes: Negative.   Respiratory: Negative.   Cardiovascular: Negative.   Gastrointestinal: Negative.   Genitourinary:       Pelvic "pulling" sensation intermittently  Musculoskeletal: Negative.   Skin: Negative.   Neurological: Negative.   Endo/Heme/Allergies: Negative.   Psychiatric/Behavioral: Negative.     PHYSICAL EXAMINATION:    BP 100/68 (BP Location: Right Arm, Patient Position: Sitting, Cuff Size: Normal)   Pulse 72   Temp (!) 97 F (36.1 C) (Skin)   Wt 125 lb 3.2 oz (56.8 kg)   LMP 07/15/2019   BMI 22.18 kg/m     General appearance: alert, cooperative and appears stated age Abdomen: soft, non-tender; non distended, no masses,  no organomegaly Incisions: healing well, the LLQ incision is expelling the stitch, removed.   Pelvic: External genitalia:  no lesions              Urethra:  normal appearing urethra with no masses, tenderness or lesions              Bartholins and Skenes: normal                 Vagina: normal appearing vagina with normal color and discharge, no lesions  Vaginal cuff: healing well.               Cervix: absent  Bimanual Exam:  Uterus:  uterus absent               Adnexa: no mass, fullness, tenderness               Chaperone was present for exam.  ASSESSMENT 4 weeks s/p TLH doing well    PLAN Routine f/u   An After Visit Summary was printed and given to the patient.

## 2019-09-30 ENCOUNTER — Ambulatory Visit (INDEPENDENT_AMBULATORY_CARE_PROVIDER_SITE_OTHER): Payer: BC Managed Care – PPO | Admitting: Obstetrics and Gynecology

## 2019-09-30 ENCOUNTER — Encounter: Payer: Self-pay | Admitting: Obstetrics and Gynecology

## 2019-09-30 ENCOUNTER — Other Ambulatory Visit: Payer: Self-pay

## 2019-09-30 ENCOUNTER — Telehealth: Payer: Self-pay | Admitting: Obstetrics and Gynecology

## 2019-09-30 VITALS — BP 110/68 | HR 68 | Temp 97.4°F | Ht 63.0 in | Wt 127.6 lb

## 2019-09-30 DIAGNOSIS — B373 Candidiasis of vulva and vagina: Secondary | ICD-10-CM | POA: Diagnosis not present

## 2019-09-30 DIAGNOSIS — B3731 Acute candidiasis of vulva and vagina: Secondary | ICD-10-CM

## 2019-09-30 DIAGNOSIS — R3989 Other symptoms and signs involving the genitourinary system: Secondary | ICD-10-CM | POA: Diagnosis not present

## 2019-09-30 LAB — POCT URINALYSIS DIPSTICK
Appearance: NEGATIVE
Bilirubin, UA: NEGATIVE
Blood, UA: NEGATIVE
Glucose, UA: NEGATIVE
Ketones, UA: NEGATIVE
Leukocytes, UA: NEGATIVE
Nitrite, UA: NEGATIVE
Protein, UA: NEGATIVE
Spec Grav, UA: 1.01 (ref 1.010–1.025)
Urobilinogen, UA: 0.2 E.U./dL
pH, UA: 5 (ref 5.0–8.0)

## 2019-09-30 MED ORDER — BETAMETHASONE VALERATE 0.1 % EX OINT: 1.0000 "application " | TOPICAL_OINTMENT | Freq: Two times a day (BID) | CUTANEOUS | 0 refills | Status: DC

## 2019-09-30 MED ORDER — FLUCONAZOLE 150 MG PO TABS: 150.0000 mg | ORAL_TABLET | Freq: Once | ORAL | 0 refills | Status: AC

## 2019-09-30 NOTE — Patient Instructions (Signed)
Vaginal Yeast infection, Adult  Vaginal yeast infection is a condition that causes vaginal discharge as well as soreness, swelling, and redness (inflammation) of the vagina. This is a common condition. Some women get this infection frequently. What are the causes? This condition is caused by a change in the normal balance of the yeast (candida) and bacteria that live in the vagina. This change causes an overgrowth of yeast, which causes the inflammation. What increases the risk? The condition is more likely to develop in women who:  Take antibiotic medicines.  Have diabetes.  Take birth control pills.  Are pregnant.  Douche often.  Have a weak body defense system (immune system).  Have been taking steroid medicines for a long time.  Frequently wear tight clothing. What are the signs or symptoms? Symptoms of this condition include:  White, thick, creamy vaginal discharge.  Swelling, itching, redness, and irritation of the vagina. The lips of the vagina (vulva) may be affected as well.  Pain or a burning feeling while urinating.  Pain during sex. How is this diagnosed? This condition is diagnosed based on:  Your medical history.  A physical exam.  A pelvic exam. Your health care provider will examine a sample of your vaginal discharge under a microscope. Your health care provider may send this sample for testing to confirm the diagnosis. How is this treated? This condition is treated with medicine. Medicines may be over-the-counter or prescription. You may be told to use one or more of the following:  Medicine that is taken by mouth (orally).  Medicine that is applied as a cream (topically).  Medicine that is inserted directly into the vagina (suppository). Follow these instructions at home:  Lifestyle  Do not have sex until your health care provider approves. Tell your sex partner that you have a yeast infection. That person should go to his or her health care  provider and ask if they should also be treated.  Do not wear tight clothes, such as pantyhose or tight pants.  Wear breathable cotton underwear. General instructions  Take or apply over-the-counter and prescription medicines only as told by your health care provider.  Eat more yogurt. This may help to keep your yeast infection from returning.  Do not use tampons until your health care provider approves.  Try taking a sitz bath to help with discomfort. This is a warm water bath that is taken while you are sitting down. The water should only come up to your hips and should cover your buttocks. Do this 3-4 times per day or as told by your health care provider.  Do not douche.  If you have diabetes, keep your blood sugar levels under control.  Keep all follow-up visits as told by your health care provider. This is important. Contact a health care provider if:  You have a fever.  Your symptoms go away and then return.  Your symptoms do not get better with treatment.  Your symptoms get worse.  You have new symptoms.  You develop blisters in or around your vagina.  You have blood coming from your vagina and it is not your menstrual period.  You develop pain in your abdomen. Summary  Vaginal yeast infection is a condition that causes discharge as well as soreness, swelling, and redness (inflammation) of the vagina.  This condition is treated with medicine. Medicines may be over-the-counter or prescription.  Take or apply over-the-counter and prescription medicines only as told by your health care provider.  Do not douche.   Do not have sex or use tampons until your health care provider approves.  Contact a health care provider if your symptoms do not get better with treatment or your symptoms go away and then return. This information is not intended to replace advice given to you by your health care provider. Make sure you discuss any questions you have with your health care  provider. Document Released: 08/03/2005 Document Revised: 03/12/2018 Document Reviewed: 03/12/2018 Elsevier Patient Education  2020 [employer]  

## 2019-09-30 NOTE — Telephone Encounter (Signed)
Spoke with patient. S/p [place] 08/19/19. Patient reports internal vaginal itching, white vaginal d/c and occasional brown tinge. Symptoms started 09/29/19. Denies pelvic pain, fever/chills, vaginal odor, urinary symptoms. Patient has not had intercourse, only manual stimulation. Asking if ok to use OTC monistat? RN recommended OV for further evaluation. [id number] prescreen negative, precautions reviewed. OV scheduled for today at 4:30pm.   Advised I will forward to Dr. Talbert Nan to review, our office will return call if any additional recommendations.   Routing to provider for final review. Patient is agreeable to disposition. Will close encounter.

## 2019-09-30 NOTE — Telephone Encounter (Signed)
Patient states she had THL surgery about 6 weeks ago and wonders if she is allowed to use Monistat.?.

## 2019-09-30 NOTE — Progress Notes (Signed)
GYNECOLOGY  VISIT   HPI: 43 y.o.   Single Black or African American Not Hispanic or Latino  female   G0P0000 with Patient's last menstrual period was 07/15/2019.   here for vaginal itching.  Symptoms started yesterday. Slight increase in brown d/c, slightly thicker.  She feels some pelvic pressure when she has a BM or voids, like she isn't empty, no pain.   GYNECOLOGIC HISTORY: Patient's last menstrual period was 07/15/2019. Contraception: Abstinence Menopausal hormone therapy: None        OB History    Gravida  0   Para  0   Term  0   Preterm  0   AB  0   Living  0     SAB  0   TAB  0   Ectopic  0   Multiple  0   Live Births  0              Patient Active Problem List   Diagnosis Date Noted  . Status post laparoscopic hysterectomy 08/19/2019  . Genetic testing 08/12/2019  . Family history of breast cancer   . Family history of colon cancer     Past Medical History:  Diagnosis Date  . Amenorrhea   . Depression   . Family history of breast cancer   . Family history of colon cancer   . Fibroid   . Heart murmur    as a child  . Menstrual cramps   . Ovarian cyst     Past Surgical History:  Procedure Laterality Date  . ABDOMINAL HYSTERECTOMY    . CYSTOSCOPY N/A 08/19/2019   Procedure: CYSTOSCOPY;  Surgeon: Salvadore Dom, MD;  Location: [facility];  Service: Gynecology;  Laterality: N/A;  . LYSIS OF ADHESION N/A 08/19/2019   Procedure: LYSIS OF ADHESION;  Surgeon: Salvadore Dom, MD;  Location: [facility];  Service: Gynecology;  Laterality: N/A;  . MYOMECTOMY  2012  . TOTAL LAPAROSCOPIC HYSTERECTOMY WITH SALPINGECTOMY Bilateral 08/19/2019   Procedure: TOTAL LAPAROSCOPIC HYSTERECTOMY WITH BILATERAL SALPINGECTOMY,LEFT OOPHORECTOMY, EXTENSIVE LYSIS OF ADHESIONS;  Surgeon: Salvadore Dom, MD;  Location: [facility];  Service: Gynecology;  Laterality: Bilateral;    Current Outpatient  Medications  Medication Sig Dispense Refill  . diphenhydramine-acetaminophen (TYLENOL PM) 25-500 MG TABS tablet Take 1 tablet by mouth at bedtime.    Marland Kitchen doxylamine, Sleep, (UNISOM) 25 MG tablet Take 25 mg by mouth at bedtime as needed.    Marland Kitchen ibuprofen (ADVIL) 800 MG tablet Take 1 tablet (800 mg total) by mouth every 8 (eight) hours as needed. 30 tablet 0  . MAGNESIUM PO Take 500 mg by mouth daily.     . miconazole (MICOTIN) 2 % cream Apply 1 application topically daily.    Marland Kitchen VITAMIN D PO Take 5,000 Units by mouth daily.      No current facility-administered medications for this visit.      ALLERGIES: Flagyl [metronidazole] and Shellfish allergy  Family History  Problem Relation Age of Onset  . Fibroids Mother   . Diabetes Father   . Fibroids Sister   . Colon cancer Maternal Grandmother   . Lung cancer Maternal Grandmother   . Breast cancer Maternal Grandmother        dx <50  . Cancer Maternal Grandfather   . Breast cancer Paternal Grandmother 98  . Heart attack Paternal Grandmother   . Breast cancer Paternal Aunt 41       recurrance at  10  . Brain cancer Paternal Aunt   . Obesity Maternal Aunt   . Diabetes Maternal Aunt   . Heart failure Maternal Aunt   . Heart disease Paternal Uncle     Social History   Socioeconomic History  . Marital status: Single    Spouse name: Not on file  . Number of children: Not on file  . Years of education: Not on file  . Highest education level: Not on file  Occupational History  . Not on file  Social Needs  . Financial resource strain: Not on file  . Food insecurity    Worry: Not on file    Inability: Not on file  . Transportation needs    Medical: Not on file    Non-medical: Not on file  Tobacco Use  . Smoking status: Former Smoker    Types: Cigars  . Smokeless tobacco: Never Used  Substance and Sexual Activity  . Alcohol use: Yes    Comment: rarely  . Drug use: No  . Sexual activity: Not Currently    Birth  control/protection: None    Comment: female partner  Lifestyle  . Physical activity    Days per week: Not on file    Minutes per session: Not on file  . Stress: Not on file  Relationships  . Social Herbalist on phone: Not on file    Gets together: Not on file    Attends religious service: Not on file    Active member of club or organization: Not on file    Attends meetings of clubs or organizations: Not on file    Relationship status: Not on file  . Intimate partner violence    Fear of current or ex partner: Not on file    Emotionally abused: Not on file    Physically abused: Not on file    Forced sexual activity: Not on file  Other Topics Concern  . Not on file  Social History Narrative  . Not on file    Review of Systems  Constitutional: Negative.   HENT: Negative.   Eyes: Negative.   Respiratory: Negative.   Cardiovascular: Negative.   Gastrointestinal: Negative.   Genitourinary:       Vaginal itching Bladder pressure  Musculoskeletal: Negative.   Skin: Negative.   Neurological: Negative.   Endo/Heme/Allergies: Negative.   Psychiatric/Behavioral: Negative.     PHYSICAL EXAMINATION:    BP 110/68 (BP Location: Right Arm, Patient Position: Sitting, Cuff Size: Normal)   Pulse 68   Temp (!) 97.4 F (36.3 C) (Skin)   Ht 5\' 3"  (1.6 m)   Wt 127 lb 9.6 oz (57.9 kg)   LMP 07/15/2019   BMI 22.60 kg/m     General appearance: alert, cooperative and appears stated age  Pelvic: External genitalia:  no lesions              Urethra:  normal appearing urethra with no masses, tenderness or lesions              Bartholins and Skenes: normal                 Vagina: normal appearing vagina with a slight increase in thick white/brownish vaginal d/c              Cervix: absent   Vaginal cuff: healing well, no areas of granulation tissue seen              Bimanual Exam: vaginal  cuff healing well, not tender Chaperone was present for exam.  Bladder doesn't feel  full.   ASSESSMENT Yeast vaginitis Pelvic pressure sensation when voiding or having BM, no pain. Normal exam and normal urine dip. Still healing from surgery.     PLAN Treat with diflucan and steroid ointment Call with any concerns.    An After Visit Summary was printed and given to the patient.

## 2019-11-08 DIAGNOSIS — K929 Disease of digestive system, unspecified: Secondary | ICD-10-CM | POA: Insufficient documentation

## 2019-12-06 ENCOUNTER — Telehealth (INDEPENDENT_AMBULATORY_CARE_PROVIDER_SITE_OTHER): Payer: BC Managed Care – PPO | Admitting: Family Medicine

## 2019-12-06 DIAGNOSIS — R143 Flatulence: Secondary | ICD-10-CM

## 2019-12-06 DIAGNOSIS — R194 Change in bowel habit: Secondary | ICD-10-CM

## 2019-12-06 NOTE — Progress Notes (Signed)
Virtual Visit via Video Note  I connected with Meredith Lee on 12/06/19 at  9:30 AM EST by a video enabled telemedicine application 2/2 [id number] pandemic and verified that I am speaking with the correct person using two identifiers.  Location patient: home Location provider:work or home office Persons participating in the virtual visit: patient, provider  I discussed the limitations of evaluation and management by telemedicine and the availability of in person appointments. The patient expressed understanding and agreed to proceed.   HPI: Pt is a 44 yo female with pmh sig for anemia s/p hysterectomy.  Pt notes increased GI issues s/p hysterectomy in Oct 2020.  Pt had a UTI then noticed loose dark brown stools. Pt is "mostly vegan".  Was eating eggs daily.  Stopped 4 days ago and notices improvement in stools, now solid.  Having excess gas and bloating.  Having regular BMs but feels like has not completely emptied her bowels.  Tried probiotic and increasing water intake.  Pt inquires about allergy testing as would like to eat eggs again.  Not eating much bread or dairy.  Denies constipation, diarrhea, n/v.  ROS: See pertinent positives and negatives per HPI.  Past Medical History:  Diagnosis Date  . Amenorrhea   . Depression   . Family history of breast cancer   . Family history of colon cancer   . Fibroid   . Heart murmur    as a child  . Menstrual cramps   . Ovarian cyst     Past Surgical History:  Procedure Laterality Date  . ABDOMINAL HYSTERECTOMY    . CYSTOSCOPY N/A 08/19/2019   Procedure: CYSTOSCOPY;  Surgeon: Salvadore Dom, MD;  Location: [facility];  Service: Gynecology;  Laterality: N/A;  . LYSIS OF ADHESION N/A 08/19/2019   Procedure: LYSIS OF ADHESION;  Surgeon: Salvadore Dom, MD;  Location: [facility];  Service: Gynecology;  Laterality: N/A;  . MYOMECTOMY  2012  . TOTAL LAPAROSCOPIC HYSTERECTOMY WITH SALPINGECTOMY Bilateral  08/19/2019   Procedure: TOTAL LAPAROSCOPIC HYSTERECTOMY WITH BILATERAL SALPINGECTOMY,LEFT OOPHORECTOMY, EXTENSIVE LYSIS OF ADHESIONS;  Surgeon: Salvadore Dom, MD;  Location: [place];  Service: Gynecology;  Laterality: Bilateral;    Family History  Problem Relation Age of Onset  . Fibroids Mother   . Diabetes Father   . Fibroids Sister   . Colon cancer Maternal Grandmother   . Lung cancer Maternal Grandmother   . Breast cancer Maternal Grandmother        dx <50  . Cancer Maternal Grandfather   . Breast cancer Paternal Grandmother 57  . Heart attack Paternal Grandmother   . Breast cancer Paternal Aunt 43       recurrance at 49  . Brain cancer Paternal Aunt   . Obesity Maternal Aunt   . Diabetes Maternal Aunt   . Heart failure Maternal Aunt   . Heart disease Paternal Uncle     Current Outpatient Medications:  .  betamethasone valerate ointment (VALISONE) 0.1 %, Apply 1 application topically 2 (two) times daily., Disp: 15 g, Rfl: 0 .  diphenhydramine-acetaminophen (TYLENOL PM) 25-500 MG TABS tablet, Take 1 tablet by mouth at bedtime., Disp: , Rfl:  .  doxylamine, Sleep, (UNISOM) 25 MG tablet, Take 25 mg by mouth at bedtime as needed., Disp: , Rfl:  .  ibuprofen (ADVIL) 800 MG tablet, Take 1 tablet (800 mg total) by mouth every 8 (eight) hours as needed., Disp: 30 tablet, Rfl: 0 .  MAGNESIUM  PO, Take 500 mg by mouth daily. , Disp: , Rfl:  .  miconazole (MICOTIN) 2 % cream, Apply 1 application topically daily., Disp: , Rfl:  .  VITAMIN D PO, Take 5,000 Units by mouth daily. , Disp: , Rfl:   EXAM:  VITALS per patient if applicable:  GENERAL: alert, oriented, appears well and in no acute distress  HEENT: atraumatic, conjunctiva clear, no obvious abnormalities on inspection of external nose and ears  NECK: normal movements of the head and neck  LUNGS: on inspection no signs of respiratory distress, breathing rate appears normal, no obvious gross SOB,  gasping or wheezing  CV: no obvious cyanosis  MS: moves all visible extremities without noticeable abnormality  PSYCH/NEURO: pleasant and cooperative, no obvious depression or anxiety, speech and thought processing grossly intact  ASSESSMENT AND PLAN:  Discussed the following assessment and plan:  Flatus   Bowel habit changes   -discussed possible causes including food allergy such as egg or gluten intolerance. -pt interested in allergy testing.  Referral placed. -discussed keeping a food diary. -continue probiotic -reviewed low FODMAP diet. -for continued symptoms and negative allergy testing will place referral to GI. - Plan: Ambulatory referral to Allergy  F/u prn  I discussed the assessment and treatment plan with the patient. The patient was provided an opportunity to ask questions and all were answered. The patient agreed with the plan and demonstrated an understanding of the instructions.   The patient was advised to call back or seek an in-person evaluation if the symptoms worsen or if the condition fails to improve as anticipated.   Billie Ruddy, MD

## 2019-12-26 ENCOUNTER — Ambulatory Visit: Payer: BC Managed Care – PPO | Admitting: Allergy & Immunology

## 2020-01-21 ENCOUNTER — Other Ambulatory Visit: Payer: Self-pay

## 2020-01-21 ENCOUNTER — Encounter: Payer: Self-pay | Admitting: Allergy & Immunology

## 2020-01-21 ENCOUNTER — Ambulatory Visit: Payer: BC Managed Care – PPO | Admitting: Allergy & Immunology

## 2020-01-21 VITALS — BP 110/72 | HR 79 | Temp 97.6°F | Resp 16 | Ht 63.0 in | Wt 135.0 lb

## 2020-01-21 DIAGNOSIS — K9049 Malabsorption due to intolerance, not elsewhere classified: Secondary | ICD-10-CM

## 2020-01-21 NOTE — Progress Notes (Signed)
 NEW PATIENT  Date of Service/Encounter:  01/21/20  Referring provider: Banks, Shannon R, MD   Assessment:   Food intolerance  History of multiple abdominal surgeries and scar formation   [name] presents for an evaluation of abdominal pain with diarrhea.  These are associated with the same sense of foods, which given the patient has realized that they are foods that are on the more fatty side.  While the time course is correct for a food allergy, or lack of other manifestations make a food allergy much less likely including hives, oral itching, wheezing, coughing, or other symptoms.  Testing today confirms this suspicion.  I do not think what she is experiencing is a food allergy, but rather her gastrointestinal intolerance.  I would highly recommend a gastroenterology referral for possible gallbladder evaluation.  In the meantime, I did provide her with some reassurance regarding the lack of an allergy.  I reassured her specifically that there is no need for an EpiPen.  We discussed the differences between an allergy and intolerance.  We can see her again on an as-needed basis.   Plan/Recommendations:   1. Food intolerance - Testing was negative to all of the foods tested. - There is a the low positive predictive value of food allergy testing and hence the high possibility of false positives. - In contrast, food allergy testing has a high negative predictive value, therefore if testing is negative we can be relatively assured that they are indeed negative. - This could still be an INTOLERANCE, but this is not an ALLERGY. - I think a GI referral is likely the next best step.  2. Return if symptoms worsen or fail to improve. This can be an in-person, a virtual Webex or a telephone follow up visit.   Subjective:   Meredith Lee is a 44 y.o. female presenting today for evaluation of  Chief Complaint  Patient presents with  . Food Intolerance    eggs, gluten, fish      Meredith Lee has a history of the following: Patient Active Problem List   Diagnosis Date Noted  . Status post laparoscopic hysterectomy 08/19/2019  . Genetic testing 08/12/2019  . Family history of breast cancer   . Family history of colon cancer     History obtained from: chart review and patient.  Meredith Lee was referred by Banks, Shannon R, MD.     Meredith Lee is a 44 y.o. female presenting for an evaluation of adverse food reactions.   Allergic Rhinitis Symptom History: She does not experience any allergy smyptoms at this time. She did hwne she lived [place]. She does not take daily antihistamines at all. She will take a Claritin on a PRN basis.   Food Allergy Symptom History: She is concerned with an egg allergy or a gluten allergy. Symptoms include diarrhea within an hour. Sometimes this is avacado or salmon or cashew. She thinks that this might be related to fats since these are fatty foods. She never had itching or breathing issues. She reports bubbling and bloating with gas production. the bowels are muddy. She is also thinking that she might have a gluten intolerance. She does not have any oral symptoms when she first eats her breakfast.   She had a hysterectomy in October 2020. She had scar tissue from a myectomy in 2012. She working on ruling out things. She has not discussed these symptoms with her surgeon this time around, but they instead focused on the "surgery   part". She had a yeast infection after surgery and a bladder surgery. She never seen GI at all.  Otherwise, there is no history of other atopic diseases, including asthma, drug allergies, environmental allergies, stinging insect allergies, eczema, urticaria or contact dermatitis. There is no significant infectious history. Vaccinations are up to date.    Past Medical History: Patient Active Problem List   Diagnosis Date Noted  . Status post laparoscopic hysterectomy 08/19/2019  . Genetic testing  08/12/2019  . Family history of breast cancer   . Family history of colon cancer     Medication List:  Allergies as of 01/21/2020      Reactions   Flagyl [metronidazole] Anaphylaxis   Shellfish Allergy Diarrhea      Medication List       Accurate as of January 21, 2020 12:11 PM. If you have any questions, ask your nurse or doctor.        STOP taking these medications   betamethasone valerate ointment 0.1 % Commonly known as: VALISONE Stopped by: Valentina Shaggy, MD   diphenhydramine-acetaminophen 25-500 MG Tabs tablet Commonly known as: TYLENOL PM Stopped by: Valentina Shaggy, MD   ibuprofen 800 MG tablet Commonly known as: ADVIL Stopped by: Valentina Shaggy, MD   miconazole 2 % cream Commonly known as: MICOTIN Stopped by: Valentina Shaggy, MD     TAKE these medications   doxylamine (Sleep) 25 MG tablet Commonly known as: UNISOM Take 25 mg by mouth at bedtime as needed.   MAGNESIUM PO Take 500 mg by mouth daily.   VITAMIN D PO Take 5,000 Units by mouth daily.       Birth History: non-contributory  Developmental History: Hildred has met anon-contributory  Past Surgical History: Past Surgical History:  Procedure Laterality Date  . ABDOMINAL HYSTERECTOMY    . CYSTOSCOPY N/A 08/19/2019   Procedure: CYSTOSCOPY;  Surgeon: Salvadore Dom, MD;  Location: [facility];  Service: Gynecology;  Laterality: N/A;  . LYSIS OF ADHESION N/A 08/19/2019   Procedure: LYSIS OF ADHESION;  Surgeon: Salvadore Dom, MD;  [facility];  Service: Gynecology;  Laterality: N/A;  . MYOMECTOMY  2012  . TOTAL LAPAROSCOPIC HYSTERECTOMY WITH SALPINGECTOMY Bilateral 08/19/2019   Procedure: TOTAL LAPAROSCOPIC HYSTERECTOMY WITH BILATERAL SALPINGECTOMY,LEFT OOPHORECTOMY, EXTENSIVE LYSIS OF ADHESIONS;  Surgeon: Salvadore Dom, MD;  Location: [place];  Service: Gynecology;  Laterality: Bilateral;      Family History: Family History  Problem Relation Age of Onset  . Fibroids Mother   . Diabetes Father   . Fibroids Sister   . Colon cancer Maternal Grandmother   . Lung cancer Maternal Grandmother   . Breast cancer Maternal Grandmother        dx <50  . Cancer Maternal Grandfather   . Breast cancer Paternal Grandmother 69  . Heart attack Paternal Grandmother   . Breast cancer Paternal Aunt 18       recurrance at 7  . Brain cancer Paternal Aunt   . Obesity Maternal Aunt   . Diabetes Maternal Aunt   . Heart failure Maternal Aunt   . Heart disease Paternal Uncle      Social History: Kaliope lives at home in a townhome.  There is wood and carpeting in the main living areas and carpeting in the bedroom.  She has gas and electric heating with central cooling.  There are neighbors dogs outside of the home, but no dogs inside of the home.  She does have dust mite covers on her bed, but not her pillows.  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   Review of Systems  Constitutional: Negative.  Negative for chills, fever, malaise/fatigue and weight loss.  HENT: Negative.  Negative for congestion, ear discharge, ear pain and sore throat.   Eyes: Negative for pain, discharge and redness.  Respiratory: Negative for cough, sputum production, shortness of breath and wheezing.   Cardiovascular: Negative.  Negative for chest pain and palpitations.  Gastrointestinal: Positive for abdominal pain and diarrhea. Negative for constipation, heartburn, nausea and vomiting.  Skin: Negative.  Negative for itching and rash.  Neurological: Negative for dizziness and headaches.  Endo/Heme/Allergies: Negative for environmental allergies. Does not bruise/bleed easily.       Objective:   Blood pressure 110/72, pulse 79, temperature 97.6 F (36.4 C),  temperature source Temporal, resp. rate 16, height 5' 3" (1.6 m), weight 135 lb (61.2 kg), last menstrual period 07/15/2019, SpO2 98 %. Body mass index is 23.91 kg/m.   Physical Exam:   Physical Exam  Constitutional: She appears well-developed.  Pleasant female.  HENT:  Head: Normocephalic and atraumatic.  Right Ear: Tympanic membrane, external ear and ear canal normal. No drainage, swelling or tenderness. Tympanic membrane is not injected, not scarred, not erythematous, not retracted and not bulging.  Left Ear: Tympanic membrane, external ear and ear canal normal. No drainage, swelling or tenderness. Tympanic membrane is not injected, not scarred, not erythematous, not retracted and not bulging.  Nose: Mucosal edema present. No rhinorrhea, nasal deformity or septal deviation. No epistaxis. Right sinus exhibits no maxillary sinus tenderness and no frontal sinus tenderness. Left sinus exhibits no maxillary sinus tenderness and no frontal sinus tenderness.  Mouth/Throat: Uvula is midline and oropharynx is clear and moist. Mucous membranes are not pale and not dry.  Eyes: Pupils are equal, round, and reactive to light. Conjunctivae and EOM are normal. Right eye exhibits no chemosis and no discharge. Left eye exhibits no chemosis and no discharge. Right conjunctiva is not injected. Left conjunctiva is not injected.  Cardiovascular: Normal rate, regular rhythm and normal heart sounds.  Respiratory: Effort normal and breath sounds normal. No accessory muscle usage. No tachypnea. No respiratory distress. She has no wheezes. She has no rhonchi. She has no rales. She exhibits no tenderness.  Moving air well in all lung fields.  GI: There is no abdominal tenderness. There is no rebound and no guarding.  Lymphadenopathy:       Head (right side): No submandibular, no tonsillar and no occipital adenopathy present.       Head (left side): No submandibular, no tonsillar and no occipital adenopathy present.     She has no cervical adenopathy.  Neurological: She is alert.  Skin: No abrasion, no petechiae and no rash noted. Rash is not papular, not vesicular and not urticarial. No erythema. No pallor.  No eczematous or urticarial lesions noted.  Psychiatric: She has a normal mood and affect.     Diagnostic studies:    Allergy Studies:    Food Adult Perc - 01/21/20 1000    Time Antigen Placed  1014    Allergen Manufacturer  Greer    Location  Back    Number of allergen test  14     Control-buffer 50% Glycerol  Negative   Simultaneous   filing. User may not have seen previous data.   Control-Histamine 1 mg/ml  2+   Simultaneous filing. User may not have seen previous data.   3. Wheat  Negative   Simultaneous filing. User may not have seen previous data.   6. Egg White, Chicken  Negative   Simultaneous filing. User may not have seen previous data.   10. Cashew  Negative   Simultaneous filing. User may not have seen previous data.   22. Salmon  Negative   Simultaneous filing. User may not have seen previous data.   30. Barley  Negative   Simultaneous filing. User may not have seen previous data.   31. Oat   Negative   Simultaneous filing. User may not have seen previous data.   32. Rye   Negative   Simultaneous filing. User may not have seen previous data.   42. Tomato  Negative   Simultaneous filing. User may not have seen previous data.   47. Mushrooms  Negative   Simultaneous filing. User may not have seen previous data.   48. Avocado  Negative   Simultaneous filing. User may not have seen previous data.   49. Onion  Negative   Simultaneous filing. User may not have seen previous data.   50. Cabbage  Negative   Simultaneous filing. User may not have seen previous data.      Allergy testing results were read and interpreted by myself, documented by clinical staff.         Joel Gallagher, MD [facility]      

## 2020-01-21 NOTE — Patient Instructions (Addendum)
1. Food intolerance - Testing was negative to all of the foods tested. - There is a the low positive predictive value of food allergy testing and hence the high possibility of false positives. - In contrast, food allergy testing has a high negative predictive value, therefore if testing is negative we can be relatively assured that they are indeed negative. - This could still be an INTOLERANCE, but this is not an ALLERGY. - I think a GI referral is likely the next best step.  2. Return if symptoms worsen or fail to improve. This can be an in-person, a virtual Webex or a telephone follow up visit.   Please inform us of any Emergency Department visits, hospitalizations, or changes in symptoms. Call us before going to the ED for breathing or allergy symptoms since we might be able to fit you in for a sick visit. Feel free to contact us anytime with any questions, problems, or concerns.  It was a pleasure to meet you today! Welcome to [place]!!   Websites that have reliable patient information: 1. American Academy of Asthma, Allergy, and Immunology: www.aaaai.org 2. Food Allergy Research and Education (FARE): foodallergy.org 3. Mothers of Asthmatics: http://www.asthmacommunitynetwork.org 4. American College of Allergy, Asthma, and Immunology: www.acaai.org   COVID-19 Vaccine Information can be found at: ShippingScam.co.uk For questions related to vaccine distribution or appointments, please email vaccine@[email or web address] or call [phone].     "Like" [place] on Facebook and Instagram for our latest updates!        Make sure you are registered to vote! If you have moved or changed any of your contact information, you will need to get this updated before voting!  In some cases, you MAY be able to register to vote online: [email or web address]     Food Intolerance [place] of the symptoms of food intolerance and food allergy are similar, but the differences between the two are very important. Eating a food you are intolerant to can leave you feeling miserable. However, if you have a true food allergy, your body's reaction to this food could be life-threatening.  Digestive system versus immune system  A food intolerance response takes place in the digestive system. It occurs when you are unable to properly breakdown the food. This could be due to enzyme deficiencies, sensitivity to food additives or reactions to naturally occurring chemicals in foods. Often, people can eat small amounts of the food without causing problems.  A food allergic reaction involves the immune system. Your immune system controls how your body defends itself. For instance, if you have an allergy to cow's milk, your immune system identifies cow's milk as an invader or allergen. Your immune system overreacts by producing antibodies called Immunoglobulin E (IgE). These antibodies travel to cells that release chemicals, causing an allergic reaction. Each type of IgE has a specific "radar" for each type of allergen.  Unlike an intolerance to food, a food allergy can cause a serious or even life-threatening reaction by eating a microscopic amount, touching or inhaling the food.  Symptoms of allergic reactions to foods are generally seen on the skin (hives, itchiness, swelling of the skin). Gastrointestinal symptoms may include vomiting and diarrhea. Respiratory symptoms may accompany skin and gastrointestinal symptoms, but don't usually occur alone.  Anaphylaxis (pronounced an-a-fi-LAK-sis) is a serious allergic reaction that happens very quickly. Symptoms of anaphylaxis may include difficulty breathing, dizziness or loss of consciousness. Without immediate treatment--an injection of epinephrine (adrenalin) and expert care--anaphylaxis can be fatal.  To the Point: there is a very serious  difference between being intolerant to a food and having a food allergy.

## 2020-01-27 ENCOUNTER — Telehealth: Payer: Self-pay | Admitting: Family Medicine

## 2020-01-27 ENCOUNTER — Other Ambulatory Visit: Payer: Self-pay

## 2020-01-27 DIAGNOSIS — R194 Change in bowel habit: Secondary | ICD-10-CM

## 2020-01-27 NOTE — Telephone Encounter (Signed)
Referral placed.

## 2020-01-27 NOTE — Telephone Encounter (Signed)
Pt is requesting a referral to gastroenterologist.

## 2020-01-27 NOTE — Telephone Encounter (Signed)
Ok for GI referral?  

## 2020-01-27 NOTE — Telephone Encounter (Signed)
Ok for referral?

## 2020-01-28 ENCOUNTER — Encounter: Payer: Self-pay | Admitting: Gastroenterology

## 2020-03-02 ENCOUNTER — Encounter: Payer: Self-pay | Admitting: Gastroenterology

## 2020-03-02 ENCOUNTER — Ambulatory Visit: Payer: BC Managed Care – PPO | Admitting: Gastroenterology

## 2020-03-02 VITALS — BP 104/64 | HR 88 | Temp 97.9°F | Ht 63.0 in | Wt 134.2 lb

## 2020-03-02 DIAGNOSIS — R194 Change in bowel habit: Secondary | ICD-10-CM | POA: Diagnosis not present

## 2020-03-02 NOTE — Progress Notes (Signed)
[facility] Consult Note:  History: Meredith Lee 03/02/2020  Referring provider: Billie Ruddy, MD  Reason for consult/chief complaint: change in bowel habits (since recent surgery.) and Diarrhea   Subjective  HPI: This is a pleasant 44 year old woman referred by primary care after recent consultation with an allergist for digestive symptoms and concern for food allergy.  Meredith Lee says that ever since her hysterectomy with unilateral oophorectomy and lysis of adhesions last October, she had a change in bowel habits with intermittent soft or "muddy" stools gas and bloating.  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  With all that, she determined that salmon, cashews and avocado were related, as symptoms resolved when she eliminated them.  She spoken with a friend is reportedly in medicine, who recommended "lipid testing".  The allergy consultant believe that her symptoms were not allergy but rather GI intolerance, and also thought she should consider gallbladder evaluation with [place].  Meredith Lee says that at this point her bowel habits are regular, she does not have abdominal pain and did not have rectal bleeding with any of this. She wonders what testing can be done to determine what foods to eat and/or what treatments could be offered so she could once again tolerate the foods that seem to trigger the symptoms.  ROS:  Review of Systems  Constitutional: Negative for appetite change and unexpected weight change.  HENT: Negative for mouth sores and voice change.   Eyes: Negative for pain and redness.  Respiratory: Negative for cough and shortness of breath.   Cardiovascular: Negative for chest pain and palpitations.  Genitourinary: Negative for dysuria and hematuria.  Musculoskeletal: Negative for arthralgias and myalgias.  Skin: Negative for pallor and rash.    Neurological: Negative for weakness and headaches.  Hematological: Negative for adenopathy.   Specifically, no hives, itching, facial or tongue swelling or breathing issues suggesting allergic response to foods.  Past Medical History: Past Medical History:  Diagnosis Date  . Amenorrhea   . Depression   . Family history of breast cancer   . Family history of colon cancer   . Fibroid   . Heart murmur    as a child  . Menstrual cramps   . Ovarian cyst      Past Surgical History: Past Surgical History:  Procedure Laterality Date  . ABDOMINAL HYSTERECTOMY    . CYSTOSCOPY N/A 08/19/2019   Procedure: CYSTOSCOPY;  Surgeon: Salvadore Dom, MD;  Location: [facility];  Service: Gynecology;  Laterality: N/A;  . LYSIS OF ADHESION N/A 08/19/2019   Procedure: LYSIS OF ADHESION;  Surgeon: Salvadore Dom, MD;  Location: [facility];  Service: Gynecology;  Laterality: N/A;  . MYOMECTOMY  2012  . TOTAL LAPAROSCOPIC HYSTERECTOMY WITH SALPINGECTOMY Bilateral 08/19/2019   Procedure: TOTAL LAPAROSCOPIC HYSTERECTOMY WITH BILATERAL SALPINGECTOMY,LEFT OOPHORECTOMY, EXTENSIVE LYSIS OF ADHESIONS;  Surgeon: Salvadore Dom, MD;  Location: [place];  Service: Gynecology;  Laterality: Bilateral;     Family History: Family History  Problem Relation Age of Onset  . Fibroids Mother   . Colon polyps Mother   . Diabetes Father   . Fibroids Sister   . Colon cancer Maternal Grandmother   . Lung cancer Maternal Grandmother   . Breast cancer Maternal Grandmother        dx <50  . Cancer Maternal Grandfather   .  Breast cancer Paternal Grandmother 3  . Heart attack Paternal Grandmother   . Breast cancer Paternal Aunt 71       recurrance at 75  . Brain cancer Paternal Aunt   . Obesity Maternal Aunt   . Diabetes Maternal Aunt   . Heart failure Maternal Aunt   . Heart disease Paternal Uncle     Social History: Social History    Socioeconomic History  . Marital status: Single    Spouse name: Not on file  . Number of children: Not on file  . Years of education: Not on file  . Highest education level: Not on file  Occupational History  . Not on file  Tobacco Use  . Smoking status: Former Smoker    Types: Cigars  . Smokeless tobacco: Never Used  Substance and Sexual Activity  . Alcohol use: Yes    Comment: rarely  . Drug use: No  . Sexual activity: Not Currently    Birth control/protection: None    Comment: female partner  Other Topics Concern  . Not on file  Social History Narrative  . Not on file   Social Determinants of Health   Financial Resource Strain:   . Difficulty of Paying Living Expenses:   Food Insecurity:   . Worried About Charity fundraiser in the Last Year:   . Arboriculturist in the Last Year:   Transportation Needs:   . Film/video editor (Medical):   Marland Kitchen Lack of Transportation (Non-Medical):   Physical Activity:   . Days of Exercise per Week:   . Minutes of Exercise per Session:   Stress:   . Feeling of Stress :   Social Connections:   . Frequency of Communication with Friends and Family:   . Frequency of Social Gatherings with Friends and Family:   . Attends Religious Services:   . Active Member of Clubs or Organizations:   . Attends Archivist Meetings:   Marland Kitchen Marital Status:     Allergies: Allergies  Allergen Reactions  . Flagyl [Metronidazole] Anaphylaxis  . Shellfish Allergy Diarrhea    Outpatient Meds: Current Outpatient Medications  Medication Sig Dispense Refill  . doxylamine, Sleep, (UNISOM) 25 MG tablet Take 25 mg by mouth at bedtime as needed.    Marland Kitchen MAGNESIUM PO Take 500 mg by mouth daily.     . Prenatal Vit-Fe Fumarate-FA (PRENATAL MULTIVITAMIN) TABS tablet Take 1 tablet by mouth daily at 12 noon.    Marland Kitchen VITAMIN D PO Take 5,000 Units by mouth daily.      No current facility-administered medications for this visit.       ___________________________________________________________________ Objective   Exam:  BP 104/64   Pulse 88   Temp 97.9 F (36.6 C)   Ht 5\' 3"  (1.6 m)   Wt 134 lb 3.2 oz (60.9 kg)   LMP 07/15/2019   SpO2 97%   BMI 23.77 kg/m    General: She is well-appearing, no muscle wasting  Eyes: sclera anicteric, no redness  ENT: oral mucosa moist without lesions, no cervical or supraclavicular lymphadenopathy  CV: RRR without murmur, S1/S2, no JVD, no peripheral edema  Resp: clear to auscultation bilaterally, normal RR and effort noted  GI: soft, no tenderness, with active bowel sounds. No guarding or palpable organomegaly noted.  Skin; warm and dry, no rash or jaundice noted  Neuro: awake, alert and oriented x 3. Normal gross motor function and fluent speech  Labs:  Negative food allergy testing  included in 01/21/2020 allergy consult note   Assessment: Encounter Diagnosis  Name Primary?  . Change in bowel habits Yes    I agree with the impression of Dr. Ernst Bowler that these are not food allergies, but rather intolerance/maldigestion. I do not know what "lipid test" she refers to, but I am not aware of specific testing in that regard.  She does not have apparent risk factors for bacterial overgrowth or exocrine pancreatic insufficiency, and her symptoms resolved with elimination of certain foods.  She could slowly reintroduce each of those foods one at a time and see if there is a "threshold" intake she can tolerate without symptoms.  Otherwise, avoiding offending foods is her best option.  She also asked about appropriate age for colorectal cancer screening since her grandmother had colon cancer.  I reassured Veena that she is still considered at average risk, and should have a screening colonoscopy at age 60.  While this type of change in bowel habit can occur post cholecystectomy, she still has her gallbladder, and these would be decidedly atypical symptoms to  indicate a gallbladder problem.  Plan:  No other testing planned at this point. I would gladly see her again as the need arises.  Thank you for the courtesy of this consult.  Please call me with any questions or concerns.  Meredith Lee III  CC: Referring provider noted above

## 2020-03-02 NOTE — Patient Instructions (Signed)
If you are age 42 or older, your body mass index should be between 23-30. Your Body mass index is 23.77 kg/m. If this is out of the aforementioned range listed, please consider follow up with your Primary Care Provider.  If you are age 70 or younger, your body mass index should be between 19-25. Your Body mass index is 23.77 kg/m. If this is out of the aformentioned range listed, please consider follow up with your Primary Care Provider.   It was a pleasure to see you today!  Dr. Loletha Carrow

## 2020-03-26 ENCOUNTER — Ambulatory Visit: Payer: BC Managed Care – PPO | Attending: Family

## 2020-03-26 DIAGNOSIS — Z23 Encounter for immunization: Secondary | ICD-10-CM

## 2020-03-26 NOTE — Progress Notes (Signed)
   Covid-19 Vaccination Clinic  Name:  Meredith Lee    MRN: [id number] DOB: Feb 06, 1976  03/26/2020  Meredith Lee was observed post Covid-19 immunization for 15 minutes without incident. She was provided with Vaccine Information Sheet and instruction to access the V-Safe system.   Meredith Lee was instructed to call 911 with any severe reactions post vaccine: Marland Kitchen Difficulty breathing  . Swelling of face and throat  . A fast heartbeat  . A bad rash all over body  . Dizziness and weakness   Immunizations Administered    Name Date Dose VIS Date Route   Pfizer COVID-19 Vaccine 03/26/2020  4:10 PM 0.3 mL 01/01/2019 Intramuscular   Manufacturer: [place]   Lot: [id number]   [place]: [id number]

## 2020-04-21 ENCOUNTER — Ambulatory Visit: Payer: BC Managed Care – PPO | Attending: Family

## 2020-04-21 DIAGNOSIS — Z23 Encounter for immunization: Secondary | ICD-10-CM

## 2020-04-21 NOTE — Progress Notes (Signed)
   Covid-19 Vaccination Clinic  Name:  Meredith Lee    MRN: [id number] DOB: 10/18/1976  04/21/2020  Meredith Lee was observed post Covid-19 immunization for 15 minutes without incident. She was provided with Vaccine Information Sheet and instruction to access the V-Safe system.   Meredith Lee was instructed to call 911 with any severe reactions post vaccine: Marland Kitchen Difficulty breathing  . Swelling of face and throat  . A fast heartbeat  . A bad rash all over body  . Dizziness and weakness   Immunizations Administered    Name Date Dose VIS Date Route   Pfizer COVID-19 Vaccine 04/21/2020  1:36 PM 0.3 mL 01/01/2019 Intramuscular   Manufacturer: [place]   Lot: [id number]   [place]: [id number]

## 2020-04-22 ENCOUNTER — Other Ambulatory Visit: Payer: Self-pay

## 2020-04-23 ENCOUNTER — Encounter: Payer: Self-pay | Admitting: Family Medicine

## 2020-04-23 ENCOUNTER — Ambulatory Visit (INDEPENDENT_AMBULATORY_CARE_PROVIDER_SITE_OTHER): Payer: BC Managed Care – PPO | Admitting: Family Medicine

## 2020-04-23 VITALS — BP 98/76 | HR 87 | Temp 97.9°F | Ht 63.0 in | Wt 136.0 lb

## 2020-04-23 DIAGNOSIS — Z131 Encounter for screening for diabetes mellitus: Secondary | ICD-10-CM

## 2020-04-23 DIAGNOSIS — Z0000 Encounter for general adult medical examination without abnormal findings: Secondary | ICD-10-CM | POA: Diagnosis not present

## 2020-04-23 DIAGNOSIS — R195 Other fecal abnormalities: Secondary | ICD-10-CM | POA: Diagnosis not present

## 2020-04-23 DIAGNOSIS — R143 Flatulence: Secondary | ICD-10-CM

## 2020-04-23 DIAGNOSIS — E782 Mixed hyperlipidemia: Secondary | ICD-10-CM

## 2020-04-23 LAB — COMPREHENSIVE METABOLIC PANEL
ALT: 30 U/L (ref 0–35)
AST: 28 U/L (ref 0–37)
Albumin: 4.3 g/dL (ref 3.5–5.2)
Alkaline Phosphatase: 88 U/L (ref 39–117)
BUN: 7 mg/dL (ref 6–23)
CO2: 31 mEq/L (ref 19–32)
Calcium: 9.4 mg/dL (ref 8.4–10.5)
Chloride: 102 mEq/L (ref 96–112)
Creatinine, Ser: 0.52 mg/dL (ref 0.40–1.20)
GFR: 156.61 mL/min (ref >60.00)
Glucose, Bld: 82 mg/dL (ref 70–99)
Potassium: 4.3 mEq/L (ref 3.5–5.1)
Sodium: 139 mEq/L (ref 135–145)
Total Bilirubin: 0.4 mg/dL (ref 0.2–1.2)
Total Protein: 7 g/dL (ref 6.0–8.3)

## 2020-04-23 LAB — CBC WITH DIFFERENTIAL/PLATELET
Basophils Absolute: 0 10*3/uL (ref 0.0–0.1)
Basophils Relative: 0.3 % (ref 0.0–3.0)
Eosinophils Absolute: 0 10*3/uL (ref 0.0–0.7)
Eosinophils Relative: 1.5 % (ref 0.0–5.0)
HCT: 37.7 % (ref 36.0–46.0)
Hemoglobin: 12.9 g/dL (ref 12.0–15.0)
Lymphocytes Relative: 41.8 % (ref 12.0–46.0)
Lymphs Abs: 0.8 10*3/uL (ref 0.7–4.0)
MCHC: 34.1 g/dL (ref 30.0–36.0)
MCV: 96.4 fl (ref 78.0–100.0)
Monocytes Absolute: 0.3 10*3/uL (ref 0.1–1.0)
Monocytes Relative: 14.6 % — ABNORMAL HIGH (ref 3.0–12.0)
Neutro Abs: 0.8 10*3/uL — ABNORMAL LOW (ref 1.4–7.7)
Neutrophils Relative %: 41.8 % — ABNORMAL LOW (ref 43.0–77.0)
Platelets: 229 10*3/uL (ref 150.0–400.0)
RBC: 3.91 Mil/uL (ref 3.87–5.11)
RDW: 12.2 % (ref 11.5–15.5)
WBC: 2 10*3/uL — ABNORMAL LOW (ref 4.0–10.5)

## 2020-04-23 LAB — LIPID PANEL
Cholesterol: 226 mg/dL — ABNORMAL HIGH (ref 0–200)
HDL: 69.9 mg/dL (ref >39.00)
LDL Cholesterol: 133 mg/dL — ABNORMAL HIGH (ref 0–99)
NonHDL: 156.42
Total CHOL/HDL Ratio: 3
Triglycerides: 116 mg/dL (ref 0.0–149.0)
VLDL: 23.2 mg/dL (ref 0.0–40.0)

## 2020-04-23 LAB — FOLATE: Folate: >24.8 ng/mL (ref >5.9)

## 2020-04-23 LAB — T4, FREE: Free T4: 0.83 ng/dL (ref 0.60–1.60)

## 2020-04-23 LAB — VITAMIN D 25 HYDROXY (VIT D DEFICIENCY, FRACTURES): VITD: 40.03 ng/mL (ref 30.00–100.00)

## 2020-04-23 LAB — HEMOGLOBIN A1C: Hgb A1c MFr Bld: 4.6 % (ref 4.6–6.5)

## 2020-04-23 LAB — LIPASE: Lipase: 13 U/L (ref 11.0–59.0)

## 2020-04-23 LAB — VITAMIN B12: Vitamin B-12: 736 pg/mL (ref 211–911)

## 2020-04-23 LAB — TSH: TSH: 0.92 u[IU]/mL (ref 0.35–4.50)

## 2020-04-23 NOTE — Patient Instructions (Signed)
Preventive Care 42-42 Years Old, Female Preventive care refers to visits with your health care provider and lifestyle choices that can promote health and wellness. This includes:  A yearly physical exam. This may also be called an annual well check.  Regular dental visits and eye exams.  Immunizations.  Screening for certain conditions.  Healthy lifestyle choices, such as eating a healthy diet, getting regular exercise, not using drugs or products that contain nicotine and tobacco, and limiting alcohol use. What can I expect for my preventive care visit? Physical exam Your health care provider will check your:  Height and weight. This may be used to calculate body mass index (BMI), which tells if you are at a healthy weight.  Heart rate and blood pressure.  Skin for abnormal spots. Counseling Your health care provider may ask you questions about your:  Alcohol, tobacco, and drug use.  Emotional well-being.  Home and relationship well-being.  Sexual activity.  Eating habits.  Work and work environment.  Method of birth control.  Menstrual cycle.  Pregnancy history. What immunizations do I need?  Influenza (flu) vaccine  This is recommended every year. Tetanus, diphtheria, and pertussis (Tdap) vaccine  You may need a Td booster every 10 years. Varicella (chickenpox) vaccine  You may need this if you have not been vaccinated. Zoster (shingles) vaccine  You may need this after age 60. Measles, mumps, and rubella (MMR) vaccine  You may need at least one dose of MMR if you were born in 1957 or later. You may also need a second dose. Pneumococcal conjugate (PCV13) vaccine  You may need this if you have certain conditions and were not previously vaccinated. Pneumococcal polysaccharide (PPSV23) vaccine  You may need one or two doses if you smoke cigarettes or if you have certain conditions. Meningococcal conjugate (MenACWY) vaccine  You may need this if you  have certain conditions. Hepatitis A vaccine  You may need this if you have certain conditions or if you travel or work in places where you may be exposed to hepatitis A. Hepatitis B vaccine  You may need this if you have certain conditions or if you travel or work in places where you may be exposed to hepatitis B. Haemophilus influenzae type b (Hib) vaccine  You may need this if you have certain conditions. Human papillomavirus (HPV) vaccine  If recommended by your health care provider, you may need three doses over 6 months. You may receive vaccines as individual doses or as more than one vaccine together in one shot (combination vaccines). Talk with your health care provider about the risks and benefits of combination vaccines. What tests do I need? Blood tests  Lipid and cholesterol levels. These may be checked every 5 years, or more frequently if you are over 50 years old.  Hepatitis C test.  Hepatitis B test. Screening  Lung cancer screening. You may have this screening every year starting at age 42 if you have a 30-pack-year history of smoking and currently smoke or have quit within the past 15 years.  Colorectal cancer screening. All adults should have this screening starting at age 42 and continuing until age 75. Your health care provider may recommend screening at age 42 if you are at increased risk. You will have tests every 1-10 years, depending on your results and the type of screening test.  Diabetes screening. This is done by checking your blood sugar (glucose) after you have not eaten for a while (fasting). You may have this   done every 1-3 years.  Mammogram. This may be done every 1-2 years. Talk with your health care provider about when you should start having regular mammograms. This may depend on whether you have a family history of breast cancer.  BRCA-related cancer screening. This may be done if you have a family history of breast, ovarian, tubal, or peritoneal  cancers.  Pelvic exam and Pap test. This may be done every 3 years starting at age 42. Starting at age 42, this may be done every 5 years if you have a Pap test in combination with an HPV test. Other tests  Sexually transmitted disease (STD) testing.  Bone density scan. This is done to screen for osteoporosis. You may have this scan if you are at high risk for osteoporosis. Follow these instructions at home: Eating and drinking  Eat a diet that includes fresh fruits and vegetables, whole grains, lean protein, and low-fat dairy.  Take vitamin and mineral supplements as recommended by your health care provider.  Do not drink alcohol if: ? Your health care provider tells you not to drink. ? You are pregnant, may be pregnant, or are planning to become pregnant.  If you drink alcohol: ? Limit how much you have to 0-1 drink a day. ? Be aware of how much alcohol is in your drink. In the [place] one drink equals one 12 oz bottle of beer (355 mL), one 5 oz glass of wine (148 mL), or one 1 oz glass of hard liquor (44 mL). Lifestyle  Take daily care of your teeth and gums.  Stay active. Exercise for at least 30 minutes on 5 or more days each week.  Do not use any products that contain nicotine or tobacco, such as cigarettes, e-cigarettes, and chewing tobacco. If you need help quitting, ask your health care provider.  If you are sexually active, practice safe sex. Use a condom or other form of birth control (contraception) in order to prevent pregnancy and STIs (sexually transmitted infections).  If told by your health care provider, take low-dose aspirin daily starting at age 42. What's next?  Visit your health care provider once a year for a well check visit.  Ask your health care provider how often you should have your eyes and teeth checked.  Stay up to date on all vaccines. This information is not intended to replace advice given to you by your health care provider. Make sure you  discuss any questions you have with your health care provider. Document Revised: 07/05/2018 Document Reviewed: 07/05/2018 Elsevier Patient Education  [place].  Abdominal Bloating When you have abdominal bloating, your abdomen may feel full, tight, or painful. It may also look bigger than normal or swollen (distended). Common causes of abdominal bloating include:  Swallowing air.  Constipation.  Problems digesting food.  Eating too much.  Irritable bowel syndrome. This is a condition that affects the large intestine.  Lactose intolerance. This is an inability to digest lactose, a natural sugar in dairy products.  Celiac disease. This is a condition that affects the ability to digest gluten, a protein found in some grains.  Gastroparesis. This is a condition that slows down the movement of food in the stomach and small intestine. It is more common in people with diabetes mellitus.  Gastroesophageal reflux disease (GERD). This is a digestive condition that makes stomach acid flow back into the esophagus.  Urinary retention. This means that the body is holding onto urine, and the bladder cannot be  emptied all the way. Follow these instructions at home: Eating and drinking  Avoid eating too much.  Try not to swallow air while talking or eating.  Avoid eating while lying down.  Avoid these foods and drinks: ? Foods that cause gas, such as broccoli, cabbage, cauliflower, and baked beans. ? Carbonated drinks. ? Hard candy. ? Chewing gum. Medicines  Take over-the-counter and prescription medicines only as told by your health care provider.  Take probiotic medicines. These medicines contain live bacteria or yeasts that can help digestion.  Take coated peppermint oil capsules. Activity  Try to exercise regularly. Exercise may help to relieve bloating that is caused by gas and relieve constipation. General instructions  Keep all follow-up visits as told by your  health care provider. This is important. Contact a health care provider if:  You have nausea and vomiting.  You have diarrhea.  You have abdominal pain.  You have unusual weight loss or weight gain.  You have severe pain, and medicines do not help. Get help right away if:  You have severe chest pain.  You have trouble breathing.  You have shortness of breath.  You have trouble urinating.  You have darker urine than normal.  You have blood in your stools or have dark, tarry stools. Summary  Abdominal bloating means that the abdomen is swollen.  Common causes of abdominal bloating are swallowing air, constipation, and problems digesting food.  Avoid eating too much and avoid swallowing air.  Avoid foods that cause gas, carbonated drinks, hard candy, and chewing gum. This information is not intended to replace advice given to you by your health care provider. Make sure you discuss any questions you have with your health care provider. Document Revised: 02/11/2019 Document Reviewed: 11/25/2016 Elsevier Patient Education  [place].

## 2020-04-23 NOTE — Progress Notes (Signed)
Subjective:     Meredith Lee is a 44 y.o. female and is here for a comprehensive physical exam. The patient reports problems - GI issues.  Pt notes continued flatus, malodorous stools at times, bloating, and loose stools since her hysterectomy in Oct 2020.  Pt states she has episodes of "dumps" immediately after eating most things-worse with salmon and cashews.  At times stomach feels "sour".  Pt eats clean, mostly vegetarian diet.  Had allergy testing, but was told likely had a sensitivity to certain foods.  Pt states she was not told the specific foods.  Pt also seen by GI, however no specific plan offered.  Pt denies emesis, blood in stools, abdominal pain.  Taking a probiotic.    Mammogram up-to-date.  Patient followed by OB/GYN,  Dr. Talbert Nan.  Social History   Socioeconomic History  . Marital status: Single    Spouse name: Not on file  . Number of children: Not on file  . Years of education: Not on file  . Highest education level: Not on file  Occupational History  . Not on file  Tobacco Use  . Smoking status: Former Smoker    Types: Cigars  . Smokeless tobacco: Never Used  Vaping Use  . Vaping Use: Never used  Substance and Sexual Activity  . Alcohol use: Yes    Comment: rarely  . Drug use: No  . Sexual activity: Not Currently    Birth control/protection: None    Comment: female partner  Other Topics Concern  . Not on file  Social History Narrative  . Not on file   Social Determinants of Health   Financial Resource Strain:   . Difficulty of Paying Living Expenses:   Food Insecurity:   . Worried About Charity fundraiser in the Last Year:   . Arboriculturist in the Last Year:   Transportation Needs:   . Film/video editor (Medical):   Marland Kitchen Lack of Transportation (Non-Medical):   Physical Activity:   . Days of Exercise per Week:   . Minutes of Exercise per Session:   Stress:   . Feeling of Stress :   Social Connections:   . Frequency of Communication  with Friends and Family:   . Frequency of Social Gatherings with Friends and Family:   . Attends Religious Services:   . Active Member of Clubs or Organizations:   . Attends Archivist Meetings:   Marland Kitchen Marital Status:   Intimate Partner Violence:   . Fear of Current or Ex-Partner:   . Emotionally Abused:   Marland Kitchen Physically Abused:   . Sexually Abused:    Health Maintenance  Topic Date Due  . Hepatitis C Screening  Never done  . HIV Screening  Never done  . TETANUS/TDAP  Never done  . INFLUENZA VACCINE  06/07/2020  . PAP SMEAR-Modifier  06/12/2022  . COVID-19 Vaccine  Completed    The following portions of the patient's history were reviewed and updated as appropriate: allergies, current medications, past family history, past medical history, past social history, past surgical history and problem list.  Review of Systems Pertinent items noted in HPI and remainder of comprehensive ROS otherwise negative.   Objective:    BP 98/76 (BP Location: Left Arm, Patient Position: Sitting, Cuff Size: Normal)   Pulse 87   Temp 97.9 F (36.6 C) (Temporal)   Ht 5\' 3"  (1.6 m)   Wt 136 lb (61.7 kg)   LMP 07/15/2019  SpO2 97%   BMI 24.09 kg/m  General appearance: alert, cooperative and no distress Head: Normocephalic, without obvious abnormality, atraumatic Eyes: conjunctivae/corneas clear. PERRL, EOM's intact. Fundi benign. Ears: normal TM's and external ear canals both ears Nose: Nares normal. Septum midline. Mucosa normal. No drainage or sinus tenderness. Throat: lips, mucosa, and tongue normal; teeth and gums normal Neck: no adenopathy, no carotid bruit, no JVD, supple, symmetrical, trachea midline and thyroid not enlarged, symmetric, no tenderness/mass/nodules Lungs: clear to auscultation bilaterally Heart: regular rate and rhythm, S1, S2 normal, no murmur, click, rub or gallop Abdomen: Soft, bowel sounds present, ND, mild TTP of LLQ and RLQ.  No  hepatosplenomegaly. Extremities: extremities normal, atraumatic, no cyanosis or edema Pulses: 2+ and symmetric Skin: Skin color, texture, turgor normal. No rashes or lesions Lymph nodes: Cervical, supraclavicular, and axillary nodes normal. Neurologic: Alert and oriented X 3, normal strength and tone. Normal symmetric reflexes. Normal coordination and gait    Assessment:    Healthy female exam with continued GI issues.     Plan:     Anticipatory guidance given including wearing seatbelts, smoke detectors in the home, increasing physical activity, increasing p.o. intake of water and vegetables. -will obtain labs -mammogram up to date.  Done 09/10/2019 -pap up to date.  S/p lap hysterectomy 08/19/2019 -given handout -next CPE in 1 yr See After Visit Summary for Counseling Recommendations    Flatus  -Discussed possible causes of continued GI symptoms including SIBO, IBS-D, food intolerance, other malabsorption issue. -We will place new referral to GI for second opinion. -Continue probiotic and food diary. - Plan: Lipase, Ambulatory referral to Gastroenterology  Loose stools  -Will have patient sign records release form to obtain recent allergy testing information. - Plan: CBC with Differential/Platelet, TSH, T4, Free, Comprehensive metabolic panel, Lipase, Vitamin D, 25-hydroxy, Vitamin B12, Folate, Ambulatory referral to Gastroenterology  Mixed hyperlipidemia  -continue lifestyle modifications - Plan: Lipid panel  Screening for diabetes mellitus  - Plan: Hemoglobin A1c  Follow-up as needed  Grier Mitts, MD

## 2020-05-05 ENCOUNTER — Other Ambulatory Visit: Payer: Self-pay | Admitting: Family Medicine

## 2020-05-05 ENCOUNTER — Telehealth: Payer: Self-pay | Admitting: Obstetrics and Gynecology

## 2020-05-05 DIAGNOSIS — R7989 Other specified abnormal findings of blood chemistry: Secondary | ICD-10-CM

## 2020-05-08 ENCOUNTER — Other Ambulatory Visit: Payer: Self-pay

## 2020-05-08 ENCOUNTER — Other Ambulatory Visit (INDEPENDENT_AMBULATORY_CARE_PROVIDER_SITE_OTHER): Payer: BC Managed Care – PPO

## 2020-05-08 DIAGNOSIS — R7989 Other specified abnormal findings of blood chemistry: Secondary | ICD-10-CM

## 2020-05-08 LAB — CBC WITH DIFFERENTIAL/PLATELET
Basophils Absolute: 0 10*3/uL (ref 0.0–0.1)
Basophils Relative: 0.4 % (ref 0.0–3.0)
Eosinophils Absolute: 0 10*3/uL (ref 0.0–0.7)
Eosinophils Relative: 1.1 % (ref 0.0–5.0)
HCT: 38.8 % (ref 36.0–46.0)
Hemoglobin: 13.1 g/dL (ref 12.0–15.0)
Lymphocytes Relative: 56.2 % — ABNORMAL HIGH (ref 12.0–46.0)
Lymphs Abs: 1.8 10*3/uL (ref 0.7–4.0)
MCHC: 33.8 g/dL (ref 30.0–36.0)
MCV: 96.2 fl (ref 78.0–100.0)
Monocytes Absolute: 0.4 10*3/uL (ref 0.1–1.0)
Monocytes Relative: 11.2 % (ref 3.0–12.0)
Neutro Abs: 1 10*3/uL — ABNORMAL LOW (ref 1.4–7.7)
Neutrophils Relative %: 31.1 % — ABNORMAL LOW (ref 43.0–77.0)
Platelets: 255 10*3/uL (ref 150.0–400.0)
RBC: 4.04 Mil/uL (ref 3.87–5.11)
RDW: 12.3 % (ref 11.5–15.5)
WBC: 3.3 10*3/uL — ABNORMAL LOW (ref 4.0–10.5)

## 2020-05-13 ENCOUNTER — Other Ambulatory Visit: Payer: Self-pay | Admitting: Family Medicine

## 2020-05-13 DIAGNOSIS — D72810 Lymphocytopenia: Secondary | ICD-10-CM

## 2020-05-14 ENCOUNTER — Telehealth: Payer: Self-pay | Admitting: Hematology and Oncology

## 2020-05-14 NOTE — Telephone Encounter (Signed)
Received a new hem referral from Dr. Volanda Napoleon for lymphopenia. [name] has been cld and scheduled to see Dr. Lindi Lee on 7/19 at 1pm. Pt aware to arrive 15 minutes early.

## 2020-05-15 ENCOUNTER — Telehealth: Payer: BC Managed Care – PPO | Admitting: Family Medicine

## 2020-05-24 NOTE — Progress Notes (Signed)
[facility] NOTE  Patient Care Team: Billie Ruddy, MD as PCP - General (Family Medicine)  CHIEF COMPLAINTS/PURPOSE OF CONSULTATION:  Newly diagnosed neutroopenia  HISTORY OF PRESENTING ILLNESS:  Meredith Lee 44 y.o. female is here because of recent diagnosis of neutroopenia. She is referred by her PCP, Dr. Volanda Napoleon. Labs on 05/03/20 showed WBC 2.0, ANC 0.8, and on 05/08/20 showed WBC 3.3, ANC 1.0. She presents to the clinic today for initial evaluation.  She reports that in October 2021 she underwent hysterectomy and removal of one of the ovaries.  Subsequently she had a vaginal yeast infection and a bladder infection but since November she has had no in problems with infections.  However recently she has been experiencing abdominal symptoms like bloating diarrhea has seen an allergic specialist regarding gluten sensitivity and was told that she does not have the gluten sensitivity.  She has seen a gastroenterologist as well.  She has not had a colonoscopy. She is here today accompanied by her partner.  I reviewed her records extensively and collaborated the history with the patient.  MEDICAL HISTORY:  Past Medical History:  Diagnosis Date  . Amenorrhea   . Depression   . Family history of breast cancer   . Family history of colon cancer   . Fibroid   . Heart murmur    as a child  . Menstrual cramps   . Ovarian cyst     SURGICAL HISTORY: Past Surgical History:  Procedure Laterality Date  . ABDOMINAL HYSTERECTOMY    . CYSTOSCOPY N/A 08/19/2019   Procedure: CYSTOSCOPY;  Surgeon: Salvadore Dom, MD;  Location: [facility];  Service: Gynecology;  Laterality: N/A;  . LYSIS OF ADHESION N/A 08/19/2019   Procedure: LYSIS OF ADHESION;  Surgeon: Salvadore Dom, MD;  Location: [facility];  Service: Gynecology;  Laterality: N/A;  . MYOMECTOMY  2012  . TOTAL LAPAROSCOPIC HYSTERECTOMY WITH SALPINGECTOMY Bilateral  08/19/2019   Procedure: TOTAL LAPAROSCOPIC HYSTERECTOMY WITH BILATERAL SALPINGECTOMY,LEFT OOPHORECTOMY, EXTENSIVE LYSIS OF ADHESIONS;  Surgeon: Salvadore Dom, MD;  Location: [facility];  Service: Gynecology;  Laterality: Bilateral;    SOCIAL HISTORY: Social History   Socioeconomic History  . Marital status: Significant Other    Spouse name: Not on file  . Number of children: Not on file  . Years of education: Not on file  . Highest education level: Not on file  Occupational History  . Not on file  Tobacco Use  . Smoking status: Former Smoker    Types: Cigars  . Smokeless tobacco: Never Used  Vaping Use  . Vaping Use: Never used  Substance and Sexual Activity  . Alcohol use: Yes    Comment: rarely  . Drug use: No  . Sexual activity: Not Currently    Birth control/protection: None    Comment: female partner  Other Topics Concern  . Not on file  Social History Narrative  . Not on file   Social Determinants of Health   Financial Resource Strain:   . Difficulty of Paying Living Expenses:   Food Insecurity:   . Worried About Charity fundraiser in the Last Year:   . Arboriculturist in the Last Year:   Transportation Needs:   . Film/video editor (Medical):   Marland Kitchen Lack of Transportation (Non-Medical):   Physical Activity:   . Days of Exercise per Week:   . Minutes of Exercise per Session:   Stress:   .  Feeling of Stress :   Social Connections:   . Frequency of Communication with Friends and Family:   . Frequency of Social Gatherings with Friends and Family:   . Attends Religious Services:   . Active Member of Clubs or Organizations:   . Attends Archivist Meetings:   Marland Kitchen Marital Status:   Intimate Partner Violence:   . Fear of Current or Ex-Partner:   . Emotionally Abused:   Marland Kitchen Physically Abused:   . Sexually Abused:     FAMILY HISTORY: Family History  Problem Relation Age of Onset  . Fibroids Mother   . Colon polyps Mother     . Diabetes Father   . Fibroids Sister   . Colon cancer Maternal Grandmother   . Lung cancer Maternal Grandmother   . Breast cancer Maternal Grandmother        dx <50  . Cancer Maternal Grandfather   . Breast cancer Paternal Grandmother 43  . Heart attack Paternal Grandmother   . Breast cancer Paternal Aunt 76       recurrance at 56  . Brain cancer Paternal Aunt   . Obesity Maternal Aunt   . Diabetes Maternal Aunt   . Heart failure Maternal Aunt   . Heart disease Paternal Uncle     ALLERGIES:  is allergic to flagyl [metronidazole] and shellfish allergy.  MEDICATIONS:  Current Outpatient Medications  Medication Sig Dispense Refill  . MAGNESIUM PO Take 500 mg by mouth daily.     . Prenatal Vit-Fe Fumarate-FA (PRENATAL MULTIVITAMIN) TABS tablet Take 1 tablet by mouth daily at 12 noon.    Marland Kitchen VITAMIN D PO Take 5,000 Units by mouth daily.      No current facility-administered medications for this visit.    REVIEW OF SYSTEMS:   Constitutional: Denies fevers, chills or abnormal night sweats Eyes: Denies blurriness of vision, double vision or watery eyes Ears, nose, mouth, throat, and face: Denies mucositis or sore throat Respiratory: Denies cough, dyspnea or wheezes Cardiovascular: Denies palpitation, chest discomfort or lower extremity swelling Gastrointestinal:  Denies nausea, heartburn or change in bowel habits Skin: Denies abnormal skin rashes Lymphatics: Denies new lymphadenopathy or easy bruising Neurological:Denies numbness, tingling or new weaknesses Behavioral/Psych: Mood is stable, no new changes  Breast: Denies any palpable lumps or discharge All other systems were reviewed with the patient and are negative.  PHYSICAL EXAMINATION: ECOG PERFORMANCE STATUS: 1 - Symptomatic but completely ambulatory  There were no vitals filed for this visit. There were no vitals filed for this visit.  GENERAL:alert, no distress and comfortable SKIN: skin color, texture, turgor are  normal, no rashes or significant lesions EYES: normal, conjunctiva are pink and non-injected, sclera clear OROPHARYNX:no exudate, no erythema and lips, buccal mucosa, and tongue normal  NECK: supple, thyroid normal size, non-tender, without nodularity LYMPH:  no palpable lymphadenopathy in the cervical, axillary or inguinal LUNGS: clear to auscultation and percussion with normal breathing effort HEART: regular rate & rhythm and no murmurs and no lower extremity edema ABDOMEN:abdomen soft, non-tender and normal bowel sounds Musculoskeletal:no cyanosis of digits and no clubbing  PSYCH: alert & oriented x 3 with fluent speech NEURO: no focal motor/sensory deficits  LABORATORY DATA:  I have reviewed the data as listed Lab Results  Component Value Date   WBC 3.3 (L) 05/08/2020   HGB 13.1 05/08/2020   HCT 38.8 05/08/2020   MCV 96.2 05/08/2020   PLT 255.0 05/08/2020   Lab Results  Component Value Date   NA  139 04/23/2020   K 4.3 04/23/2020   CL 102 04/23/2020   CO2 31 04/23/2020    RADIOGRAPHIC STUDIES: I have personally reviewed the radiological reports and agreed with the findings in the report.  ASSESSMENT AND PLAN:  Neutropenia ([place]) Leucopenia especially Neutropenia:  Lab review:   07/12/18: WBC 3.6 08/20/19: WBC 7.5 04/23/20: WBC 2, ANC 0.8 05/08/20: WBC 3.3, ANC 1  Differential diagnosis: 1. Infections: Especially viruses  2. inflammation/autoimmune causes including lupus/rheumatoid arthritis 3. Nutritional causes but B-79 or folic acid deficiency, selenium copper and zinc deficiencies are also rare occurrences 4. Medication induced: I do not see any medications that can cause this problem. 5. Bone marrow disorders: Since the rest of the blood work appears to be normal the likelihood for bone marrow disorder is very low. 6.  Cyclical/ethnicity related  Workup today: 1. CBC with differential with smear 2. folic acid levels (previous B12 levels were normal) 3. ANA 4.   Selenium zinc and copper levels 5.  Hepatitis B, hepatitis C and HIV  MyChart virtual visit on 05/28/2020 to discuss results I discussed with her that if all the tests are normal then we can watch and monitor her blood counts.  If her neutrophil count drops below 0.5 then we will consider doing a bone marrow biopsy.  All questions were answered. The patient knows to call the clinic with any problems, questions or concerns.   Rulon Eisenmenger, MD, MPH 05/25/2020    I, Molly Dorshimer, am acting as scribe for Nicholas Lose, MD.  I have reviewed the above documentation for accuracy and completeness, and I agree with the above.

## 2020-05-25 ENCOUNTER — Other Ambulatory Visit: Payer: Self-pay

## 2020-05-25 ENCOUNTER — Inpatient Hospital Stay: Payer: BC Managed Care – PPO

## 2020-05-25 ENCOUNTER — Inpatient Hospital Stay: Payer: BC Managed Care – PPO | Attending: Hematology and Oncology | Admitting: Hematology and Oncology

## 2020-05-25 DIAGNOSIS — D709 Neutropenia, unspecified: Secondary | ICD-10-CM | POA: Insufficient documentation

## 2020-05-25 DIAGNOSIS — Z87891 Personal history of nicotine dependence: Secondary | ICD-10-CM | POA: Insufficient documentation

## 2020-05-25 LAB — CBC WITH DIFFERENTIAL (CANCER CENTER ONLY)
Abs Immature Granulocytes: 0.01 10*3/uL (ref 0.00–0.07)
Basophils Absolute: 0 10*3/uL (ref 0.0–0.1)
Basophils Relative: 1 %
Eosinophils Absolute: 0 10*3/uL (ref 0.0–0.5)
Eosinophils Relative: 1 %
HCT: 40.2 % (ref 36.0–46.0)
Hemoglobin: 13.7 g/dL (ref 12.0–15.0)
Immature Granulocytes: 0 %
Lymphocytes Relative: 47 %
Lymphs Abs: 1.5 10*3/uL (ref 0.7–4.0)
MCH: 32.2 pg (ref 26.0–34.0)
MCHC: 34.1 g/dL (ref 30.0–36.0)
MCV: 94.6 fL (ref 80.0–100.0)
Monocytes Absolute: 0.3 10*3/uL (ref 0.1–1.0)
Monocytes Relative: 10 %
Neutro Abs: 1.3 10*3/uL — ABNORMAL LOW (ref 1.7–7.7)
Neutrophils Relative %: 41 %
Platelet Count: 265 10*3/uL (ref 150–400)
RBC: 4.25 MIL/uL (ref 3.87–5.11)
RDW: 11.7 % (ref 11.5–15.5)
WBC Count: 3.2 10*3/uL — ABNORMAL LOW (ref 4.0–10.5)
nRBC: 0 % (ref 0.0–0.2)

## 2020-05-25 LAB — HEPATITIS B SURFACE ANTIGEN: Hepatitis B Surface Ag: NONREACTIVE

## 2020-05-25 LAB — HEPATITIS C ANTIBODY: HCV Ab: NONREACTIVE

## 2020-05-25 LAB — HIV ANTIBODY (ROUTINE TESTING W REFLEX): HIV Screen 4th Generation wRfx: NONREACTIVE

## 2020-05-25 NOTE — Assessment & Plan Note (Signed)
Leucopenia especially Neutropenia:  Lab review:   07/12/18: WBC 3.6 08/20/19: WBC 7.5 04/23/20: WBC 2, ANC 0.8 05/08/20: WBC 3.3, ANC 1  Differential diagnosis: 1. Infections: Especially viruses like HIV 2. inflammation/autoimmune causes including lupus/rheumatoid arthritis 3. Nutritional causes but T-65 or folic acid deficiency 4. Medication induced 5. Bone marrow disorders  Workup today: 1. CBC with differential with smear 2. Y-65 and folic acid levels 3. ANA 4. Flow cytometry  Return to clinic in 2 weeks to discuss the results and consider bone marrow biopsy if necessary I will call the patient with the results of the blood work.

## 2020-05-26 ENCOUNTER — Telehealth: Payer: Self-pay | Admitting: Hematology and Oncology

## 2020-05-26 LAB — ANTINUCLEAR ANTIBODIES, IFA: ANA Ab, IFA: POSITIVE — AB

## 2020-05-26 LAB — FANA STAINING PATTERNS: Speckled Pattern: 1:320 {titer} — ABNORMAL HIGH

## 2020-05-26 LAB — SELENIUM SERUM: Selenium: 121 ug/L (ref 93–198)

## 2020-05-26 NOTE — Telephone Encounter (Signed)
Scheduled per 7/19 los. Called and spoke with pt, confirmed 7/22 appt

## 2020-05-27 ENCOUNTER — Telehealth: Payer: Self-pay | Admitting: Hematology and Oncology

## 2020-05-27 LAB — COPPER, SERUM: Copper: 118 ug/dL (ref 80–158)

## 2020-05-27 LAB — ZINC: Zinc: 90 ug/dL (ref 44–115)

## 2020-05-27 NOTE — Assessment & Plan Note (Signed)
Leucopenia especially Neutropenia:  Lab review:   07/12/18: WBC 3.6 08/20/19: WBC 7.5 04/23/20: WBC 2, ANC 0.8 05/08/20: WBC 3.3, ANC 1  Differential diagnosis: 1. Infections: Especially viruses  2. inflammation/autoimmune causes including lupus/rheumatoid arthritis 3. Nutritional causes but Y-56 or folic acid deficiency, selenium copper and zinc deficiencies are also rare occurrences 4. Medication induced: I do not see any medications that can cause this problem. 5. Bone marrow disorders: Since the rest of the blood work appears to be normal the likelihood for bone marrow disorder is very low. 6.  Cyclical/ethnicity related  Workup today: 1. CBC with differential with smear 2. folic acid levels (previous B12 levels were normal) 3. ANA: Positive1: 320 speckled pattern 4.  Selenium zinc and copper levels: Normal 5.  Hepatitis B, hepatitis C and HIV: Normal  MyChart virtual visit on 05/28/2020 to discuss results I discussed with her that if all the tests are normal then we can watch and monitor her blood counts.  If her neutrophil count drops below 0.5 then we will consider doing a bone marrow biopsy.  All questions were answered. The patient knows to call the clinic with any problems, questions or concerns.   Rulon Eisenmenger, MD, MPH 05/25/2020

## 2020-05-27 NOTE — Progress Notes (Signed)
  HEMATOLOGY-ONCOLOGY TELEPHONE VISIT PROGRESS NOTE  I connected with Meredith Lee on 05/28/2020 at  9:45 AM EDT by telephone and verified that I am speaking with the correct person using two identifiers.  I discussed the limitations, risks, security and privacy concerns of performing an evaluation and management service by telephone and the availability of in person appointments.  I also discussed with the patient that there may be a patient responsible charge related to this service. The patient expressed understanding and agreed to proceed.   History of Present Illness: Meredith Lee is a 44 y.o. female with above-mentioned history of neutropenia. Labs on 7/19//21 showed WBC 3.2, ANC 1.3, ANA positive. She presents over the phone today for follow-up.   Observations/Objective:  No new symptoms or concerns.   Assessment Plan:  Neutropenia ([place]) Leucopenia especially Neutropenia:  Lab review:   07/12/18: WBC 3.6 08/20/19: WBC 7.5 04/23/20: WBC 2, ANC 0.8 05/08/20: WBC 3.3, ANC 1  Differential diagnosis: 1. Infections: Especially viruses  2. inflammation/autoimmune causes including lupus/rheumatoid arthritis 3. Nutritional causes but Q-14 or folic acid deficiency, selenium copper and zinc deficiencies are also rare occurrences 4. Medication induced: I do not see any medications that can cause this problem. 5. Bone marrow disorders: Since the rest of the blood work appears to be normal the likelihood for bone marrow disorder is very low. 6.  Cyclical/ethnicity related  Workup today: 1. CBC with differential with smear: WBC 3.2, hemoglobin 83.0, ANC 1.3 2. folic acid levels (previous B12 levels were normal) 3. ANA: Positive1: 320 speckled pattern I informed her that having a positive ANA does not automatically mean that she has lupus.  If she develops any other symptoms of lupus then she needs to see a rheumatologist.  I discussed with her the symptoms of lupus. 4.   Selenium zinc and copper levels: Normal 5.  Hepatitis B, hepatitis C and HIV: Normal  If her neutrophil count drops below 0.5 then we will consider doing a bone marrow biopsy. Return to clinic in 3 months with lab and follow-up All questions were answered. The patient knows to call the clinic with any problems, questions or concerns.   Rulon Eisenmenger, MD, MPH 05/25/2020       I discussed the assessment and treatment plan with the patient. The patient was provided an opportunity to ask questions and all were answered. The patient agreed with the plan and demonstrated an understanding of the instructions. The patient was advised to call back or seek an in-person evaluation if the symptoms worsen or if the condition fails to improve as anticipated.   I provided 12 minutes of non-face-to-face time during this encounter.   Rulon Eisenmenger, MD 05/28/2020    I, Molly Dorshimer, am acting as scribe for Nicholas Lose, MD.  I have reviewed the above documentation for accuracy and completeness, and I agree with the above.

## 2020-05-28 ENCOUNTER — Inpatient Hospital Stay (HOSPITAL_BASED_OUTPATIENT_CLINIC_OR_DEPARTMENT_OTHER): Payer: BC Managed Care – PPO | Admitting: Hematology and Oncology

## 2020-05-28 DIAGNOSIS — D709 Neutropenia, unspecified: Secondary | ICD-10-CM

## 2020-05-29 ENCOUNTER — Telehealth: Payer: Self-pay | Admitting: Hematology and Oncology

## 2020-05-29 NOTE — Telephone Encounter (Signed)
Scheduled appts per 7/22 los. Left voicemail with appt date and time.

## 2020-06-17 NOTE — Progress Notes (Signed)
44 y.o. [id number] Significant Other Black or African American Not Hispanic or Latino female here for annual exam. Patient is asking if after her hysterectomy last year if she was given any antibiotics in her IV. She is having a lot of digestive issues and another provider asked her this question.   She had a total laparoscopic hysterectomy, LSO, RS and extensive LOA in 10/20. She has had an occasional hot flash. Sexually active, same partner x 10 months. Recent negative HIV testing.   No bladder c/o.  She has been having dumping of stool after eating, increase in gas, soft stools. Symptoms were mild after surgery in 10/20, got worse in 12/20. Primary thought it might be from Gluten, saw an Allergist, felt she could be sensitive to foods but not allergic. She saw a GI MD, just told her to keep eliminating foods. No tests were done.  She is getting another opinion with a different GI MD. Primary thinks she may have an overgrowth of intestinal bacterial in her small bowel. She has really limited her diet.   Her WBC was low, ANA was + in 6/21, she saw a Hematologist, will f/u there in the fall.     Patient's last menstrual period was 07/15/2019.          Sexually active: Yes.    The current method of family planning is status post hysterectomy.    Exercising: Yes.    Cardio and weights Smoker:  no  Health Maintenance: Pap:  12-08-17 ASCUS HPV HR +, negative colposcopy. 06-13-2019 neg HPV HR neg History of abnormal Pap:  yes MMG:  09-10-2019 category c density birads 1:neg BMD:   none Colonoscopy: none TDaP:  She knows she is due for one.  Gardasil: no   reports that she has quit smoking. Her smoking use included cigars. She has never used smokeless tobacco. She reports current alcohol use. She reports that she does not use drugs. She is teaching at [employer], Writing and [place]  Past Medical History:  Diagnosis Date   Amenorrhea    Depression    Family history of breast cancer    Family history  of colon cancer    Fibroid    Heart murmur    as a child   Menstrual cramps    Ovarian cyst     Past Surgical History:  Procedure Laterality Date   ABDOMINAL HYSTERECTOMY     CYSTOSCOPY N/A 08/19/2019   Procedure: CYSTOSCOPY;  Surgeon: Salvadore Dom, MD;  Location: [facility];  Service: Gynecology;  Laterality: N/A;   LYSIS OF ADHESION N/A 08/19/2019   Procedure: LYSIS OF ADHESION;  Surgeon: Salvadore Dom, MD;  Location: [facility];  Service: Gynecology;  Laterality: N/A;   MYOMECTOMY  2012   TOTAL LAPAROSCOPIC HYSTERECTOMY WITH SALPINGECTOMY Bilateral 08/19/2019   Procedure: TOTAL LAPAROSCOPIC HYSTERECTOMY WITH BILATERAL SALPINGECTOMY,LEFT OOPHORECTOMY, EXTENSIVE LYSIS OF ADHESIONS;  Surgeon: Salvadore Dom, MD;  Location: [place];  Service: Gynecology;  Laterality: Bilateral;    Current Outpatient Medications  Medication Sig Dispense Refill   MAGNESIUM PO Take 500 mg by mouth daily.      Prenatal Vit-Fe Fumarate-FA (PRENATAL MULTIVITAMIN) TABS tablet Take 1 tablet by mouth daily at 12 noon.     VITAMIN D PO Take 5,000 Units by mouth daily.      No current facility-administered medications for this visit.    Family History  Problem Relation Age of Onset   Fibroids Mother  Colon polyps Mother    Diabetes Father    Fibroids Sister    Colon cancer Maternal Grandmother    Lung cancer Maternal Grandmother    Breast cancer Maternal Grandmother        dx <50   Cancer Maternal Grandfather    Breast cancer Paternal Grandmother 52   Heart attack Paternal Grandmother    Breast cancer Paternal Aunt 64       recurrance at 21   Brain cancer Paternal Aunt    Obesity Maternal Aunt    Diabetes Maternal Aunt    Heart failure Maternal Aunt    Heart disease Paternal Uncle     Review of Systems  Exam:   BP 90/62    Pulse 69    Ht 5' 3.5" (1.613 m)    Wt 136 lb (61.7 kg)    LMP  07/15/2019    SpO2 100%    BMI 23.71 kg/m   Weight change: @WEIGHTCHANGE @ Height:   Height: 5' 3.5" (161.3 cm)  Ht Readings from Last 3 Encounters:  06/18/20 5' 3.5" (1.613 m)  05/25/20 5\' 3"  (1.6 m)  04/23/20 5\' 3"  (1.6 m)    General appearance: alert, cooperative and appears stated age Head: Normocephalic, without obvious abnormality, atraumatic Neck: no adenopathy, supple, symmetrical, trachea midline and thyroid normal to inspection and palpation Lungs: clear to auscultation bilaterally Cardiovascular: regular rate and rhythm Breasts: normal appearance, no masses or tenderness Abdomen: soft, non-tender; non distended,  no masses,  no organomegaly Extremities: extremities normal, atraumatic, no cyanosis or edema Skin: Skin color, texture, turgor normal. No rashes or lesions Lymph nodes: Cervical, supraclavicular, and axillary nodes normal. No abnormal inguinal nodes palpated Neurologic: Grossly normal   Pelvic: External genitalia:  no lesions              Urethra:  normal appearing urethra with no masses, tenderness or lesions              Bartholins and Skenes: normal                 Vagina: normal appearing vagina with normal color and discharge, no lesions              Cervix: absent               Bimanual Exam:  Uterus:  uterus absent              Adnexa: no mass, fullness, tenderness               Rectovaginal: Confirms               Anus:  normal sphincter tone, no lesions  Gae Dry chaperoned for the exam.  A:  Well Woman with normal exam  Elevated risk of breast cancer    P:   Mammogram in 11/21  Breast MRI   Pap with hpv from vaginal apex  Discussed breast self exam  Discussed calcium and vit D intake  Vaginal swab for std testing

## 2020-06-18 ENCOUNTER — Ambulatory Visit: Payer: BC Managed Care – PPO | Admitting: Obstetrics and Gynecology

## 2020-06-18 ENCOUNTER — Encounter: Payer: Self-pay | Admitting: Obstetrics and Gynecology

## 2020-06-18 ENCOUNTER — Other Ambulatory Visit (HOSPITAL_COMMUNITY)
Admission: RE | Admit: 2020-06-18 | Discharge: 2020-06-18 | Disposition: A | Discharge status: Home / Self Care | Payer: BC Managed Care – PPO | Source: Ambulatory Visit | Attending: Obstetrics and Gynecology | Admitting: Obstetrics and Gynecology

## 2020-06-18 ENCOUNTER — Telehealth: Payer: Self-pay | Admitting: *Deleted

## 2020-06-18 ENCOUNTER — Other Ambulatory Visit: Payer: Self-pay

## 2020-06-18 VITALS — BP 90/62 | HR 69 | Ht 63.5 in | Wt 136.0 lb

## 2020-06-18 DIAGNOSIS — Z1272 Encounter for screening for malignant neoplasm of vagina: Secondary | ICD-10-CM | POA: Diagnosis not present

## 2020-06-18 DIAGNOSIS — Z9189 Other specified personal risk factors, not elsewhere classified: Secondary | ICD-10-CM | POA: Diagnosis not present

## 2020-06-18 DIAGNOSIS — Z01419 Encounter for gynecological examination (general) (routine) without abnormal findings: Secondary | ICD-10-CM

## 2020-06-18 DIAGNOSIS — Z113 Encounter for screening for infections with a predominantly sexual mode of transmission: Secondary | ICD-10-CM | POA: Diagnosis not present

## 2020-06-18 DIAGNOSIS — Z833 Family history of diabetes mellitus: Secondary | ICD-10-CM | POA: Diagnosis not present

## 2020-06-18 DIAGNOSIS — Z803 Family history of malignant neoplasm of breast: Secondary | ICD-10-CM

## 2020-06-18 NOTE — Telephone Encounter (Signed)
Spoke with patient. Advised screening MRI order placed to [facility], they will contact you directly to schedule. Once scheduled our office will precert. Patient verbalizes understanding and is agreeable.   Routing to [employer] for [employer].   Encounter closed.

## 2020-06-18 NOTE — Patient Instructions (Signed)

## 2020-06-18 NOTE — Telephone Encounter (Signed)
MRI breast bilateral w/wo contrast ordered at [facility].  23.5% lifetime risk, [place] Family hx breast cancer MGM, PGM and aunt.

## 2020-06-18 NOTE — Telephone Encounter (Signed)
-----   Message from Salvadore Dom, MD sent at 06/18/2020 11:45 AM EDT ----- She needs a breast MRI, one was ordered last year, I suspect it needs to be reordered. Thanks, Sharee Pimple

## 2020-06-19 LAB — CHLAMYDIA/GONOCOCCUS/TRICHOMONAS, NAA
Chlamydia by NAA: NEGATIVE
Gonococcus by NAA: NEGATIVE
Trich vag by NAA: NEGATIVE

## 2020-06-22 LAB — CYTOLOGY - PAP
Comment: NEGATIVE
Diagnosis: NEGATIVE
High risk HPV: NEGATIVE

## 2020-07-11 ENCOUNTER — Other Ambulatory Visit: Payer: BC Managed Care – PPO

## 2020-07-21 LAB — HM COLONOSCOPY

## 2020-07-27 ENCOUNTER — Other Ambulatory Visit: Payer: Self-pay | Admitting: Obstetrics and Gynecology

## 2020-07-27 DIAGNOSIS — Z1231 Encounter for screening mammogram for malignant neoplasm of breast: Secondary | ICD-10-CM

## 2020-07-29 ENCOUNTER — Encounter: Payer: Self-pay | Admitting: Family Medicine

## 2020-08-28 ENCOUNTER — Ambulatory Visit: Payer: BC Managed Care – PPO | Admitting: Hematology and Oncology

## 2020-08-28 ENCOUNTER — Other Ambulatory Visit: Payer: BC Managed Care – PPO

## 2020-09-10 ENCOUNTER — Ambulatory Visit: Payer: BC Managed Care – PPO | Admitting: Family Medicine

## 2020-09-10 ENCOUNTER — Encounter: Payer: Self-pay | Admitting: Family Medicine

## 2020-09-10 ENCOUNTER — Other Ambulatory Visit: Payer: Self-pay | Admitting: Family Medicine

## 2020-09-10 ENCOUNTER — Ambulatory Visit (INDEPENDENT_AMBULATORY_CARE_PROVIDER_SITE_OTHER): Payer: BC Managed Care – PPO

## 2020-09-10 ENCOUNTER — Other Ambulatory Visit: Payer: Self-pay

## 2020-09-10 VITALS — BP 98/76 | HR 80 | Temp 98.0°F | Wt 137.0 lb

## 2020-09-10 DIAGNOSIS — K929 Disease of digestive system, unspecified: Secondary | ICD-10-CM | POA: Diagnosis not present

## 2020-09-10 DIAGNOSIS — M5412 Radiculopathy, cervical region: Secondary | ICD-10-CM

## 2020-09-10 IMAGING — DX DG CERVICAL SPINE COMPLETE 4+V
6 series · 6 of 6 positions shown · non-contrast
Comparison: None.

CLINICAL DATA: Left arm radiculopathy.

EXAM:
CERVICAL SPINE - COMPLETE 4+ VIEW

[cervical spine lat]
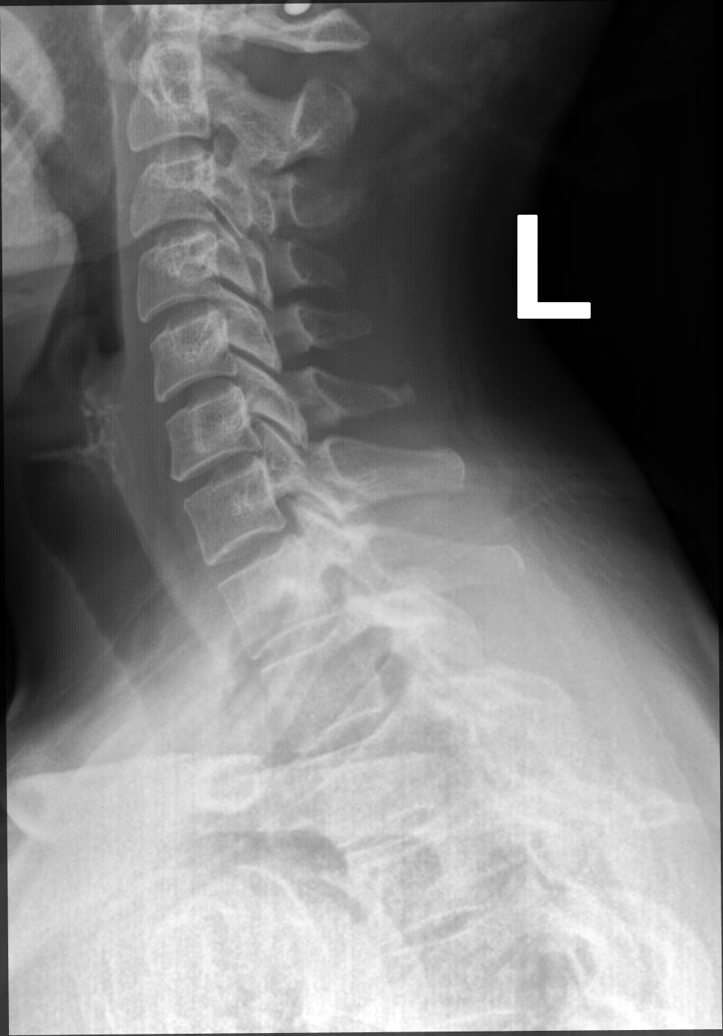

[cervical spine oblique (1 of 2)]
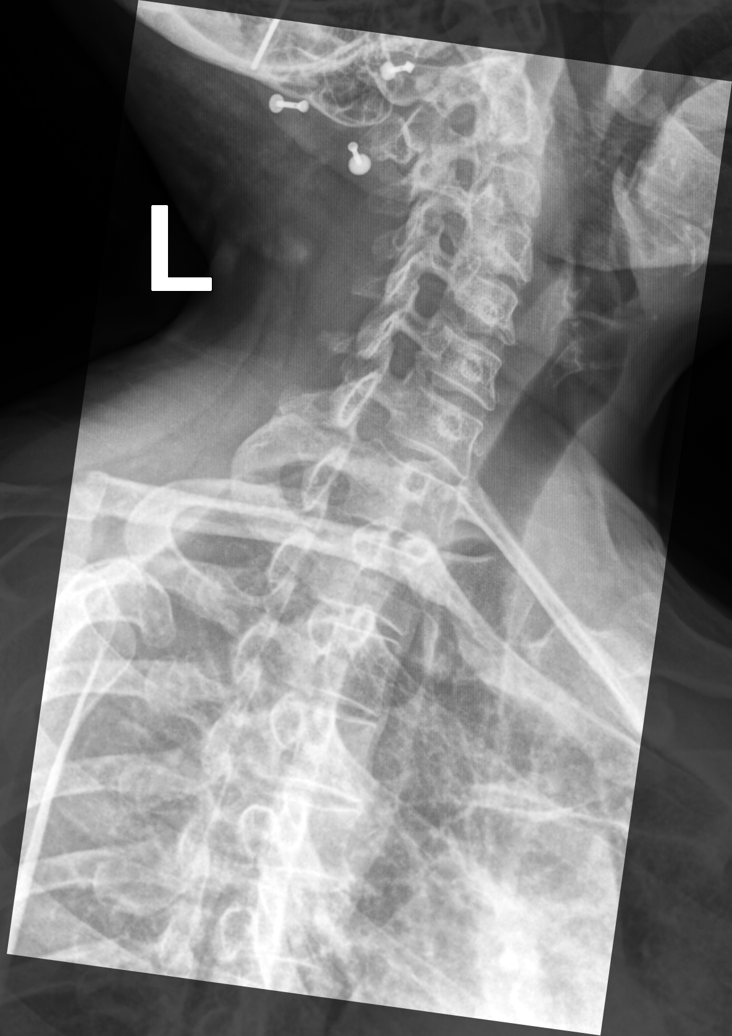

[cervical spine oblique (2 of 2)]
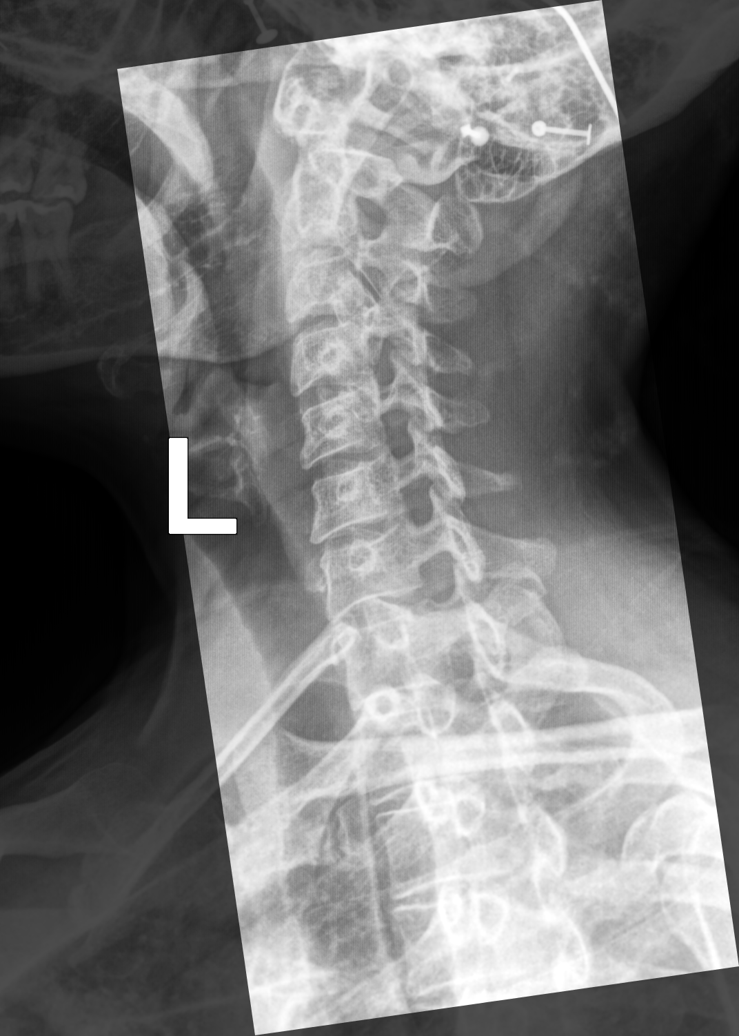

[cervical spine ap]
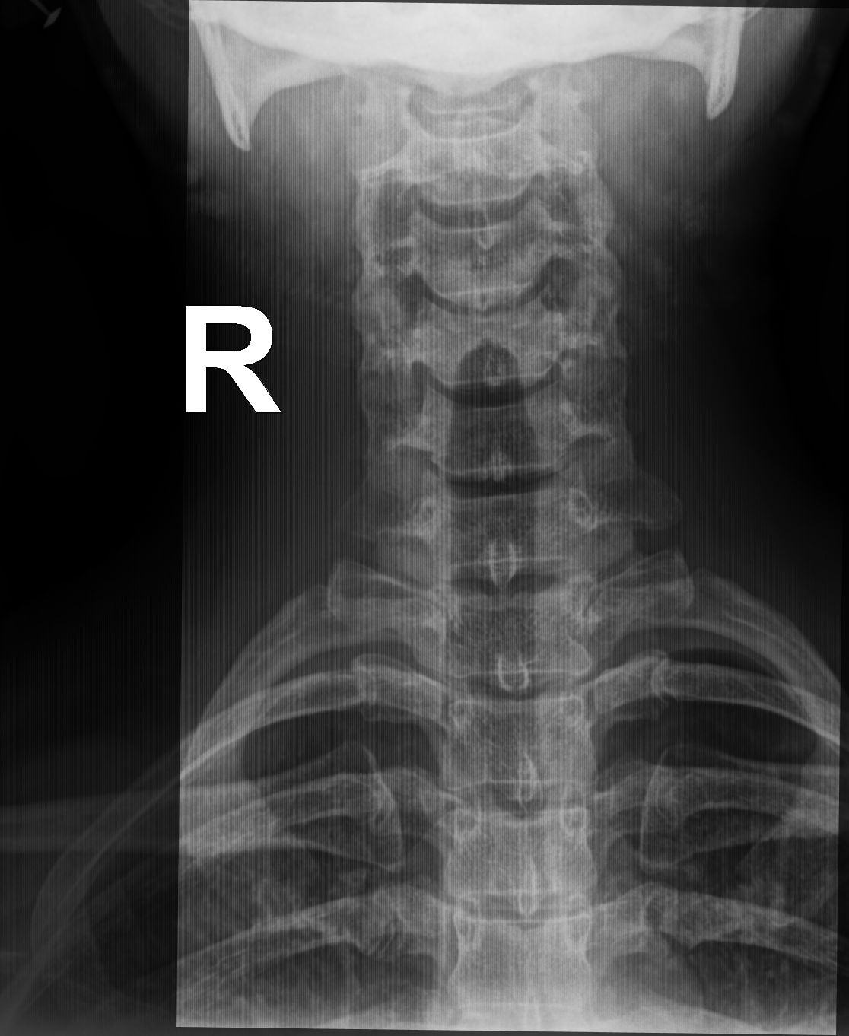

[cervical spine open mouth ap (1 of 2)]
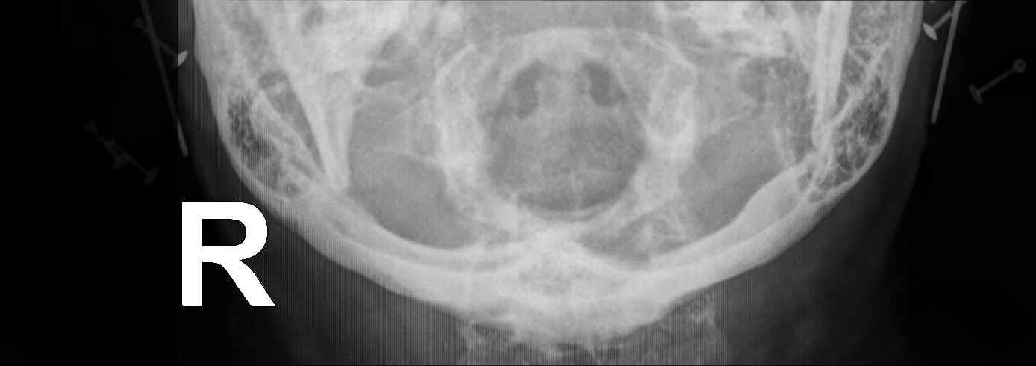

[cervical spine open mouth ap (2 of 2)]
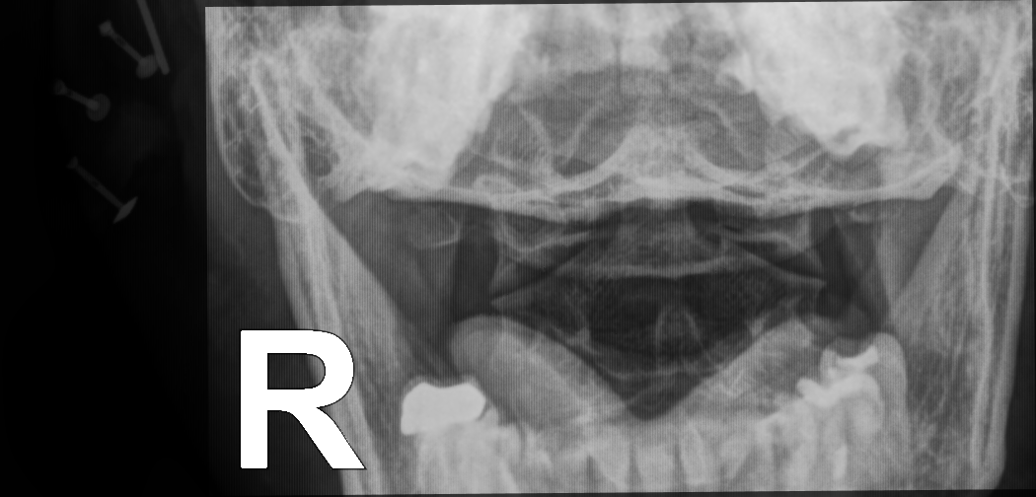

[6 of 6 positions shown; findings below may reference images not displayed]

FINDINGS: On the lateral view the cervical spine is visualized to the level of
C7. Alignment is normal.

Dens is well positioned between the lateral masses of C1. There is
limited evaluation of the dens for acute fracture on the open-mouth
view due to overlying osseous structures.

No acute displaced fracture is detected. Cervical disc heights are
preserved.No aggressive-appearing focal osseous lesions.

Pre-vertebral soft tissues are within normal limits.
IMPRESSION: Negative cervical spine radiographs.

## 2020-09-10 MED ORDER — METHOCARBAMOL 500 MG PO TABS: 500.0000 mg | ORAL_TABLET | Freq: Every evening | ORAL | 0 refills | Status: DC | PRN

## 2020-09-10 NOTE — Telephone Encounter (Signed)
Please advise 

## 2020-09-10 NOTE — Progress Notes (Signed)
Subjective:    Patient ID: Meredith Lee, female    DOB: 1976-10-27, 44 y.o.   MRN: [id number]  No chief complaint on file.   HPI Patient was seen today for acute concern.  Patient endorses left arm pain with numbness, tingling in left UE x months.  Left thumb becoming stiff/stuck in a flexed position at when in bed.  Task such as picking up a cup or pulling the sheets up when in bed cause discomfort.  Lifting grocery bags does not cause much discomfort.  Pt has full ROM of b/l UEs, but some discomfort/pulling sensation in L shoulder.  Pt does not recall injury.  Taking ibuprofen prn, doing yoga, getting monthly massages.  Considering getting bimonthly massages.  Pt with increased muscle tension in neck and back.  Patient has a form that needs completing so her massages will be covered by her insurance company.  Patient seen by GI for ongoing stomach issues.  Patient was having frequent diarrhea.  Had a colonoscopy which was negative.  Patient notes improvement in symptoms since starting low FODMAP diet.  Patient now having regular stools.  Patient still interested in knowing the exact cause of her symptoms.  Inquires about SIBO.  Past Medical History:  Diagnosis Date  . Amenorrhea   . Depression   . Family history of breast cancer   . Family history of colon cancer   . Fibroid   . Heart murmur    as a child  . Menstrual cramps   . Ovarian cyst     Allergies  Allergen Reactions  . Flagyl [Metronidazole] Anaphylaxis  . Shellfish Allergy Diarrhea    ROS General: Denies fever, chills, night sweats, changes in weight, changes in appetite HEENT: Denies headaches, ear pain, changes in vision, rhinorrhea, sore throat CV: Denies CP, palpitations, SOB, orthopnea Pulm: Denies SOB, cough, wheezing GI: Denies abdominal pain, nausea, vomiting, diarrhea, constipation GU: Denies dysuria, hematuria, frequency, vaginal discharge Msk: Denies muscle cramps, joint pains  + left shoulder  pain Neuro: Denies weakness  + numbness, tingling in LUE Skin: Denies rashes, bruising Psych: Denies depression, anxiety, hallucinations     Objective:    Blood pressure 98/76, pulse 80, temperature 98 F (36.7 C), temperature source Oral, weight 137 lb (62.1 kg), last menstrual period 07/15/2019, SpO2 98 %.   Gen. Pleasant, well-nourished, in no distress, normal affect   HEENT: /AT, face symmetric, conjunctiva clear, no scleral icterus, PERRLA, EOMI, nares patent without drainage Lungs: no accessory muscle use Cardiovascular: RRR, no peripheral edema Musculoskeletal: Full range of motion of bilateral UEs.  No asymmetry.  Negative crossarm, empty can.  Neer's and Hawkins causes discomfort in the left shoulder.  Bilateral trapezius muscle tension noted on palpation.  No TTP of cervical, thoracic, lumbar spine.  No tenderness over long head of biceps brachii b/l.  TTP superior to left clavicle causing numbness in LUE.  Negative axial loading.  No deformities, no cyanosis or clubbing, normal tone Neuro:  A&Ox3, CN II-XII intact, normal gait Skin:  Warm, no lesions/ rash   Wt Readings from Last 3 Encounters:  09/10/20 137 lb (62.1 kg)  06/18/20 136 lb (61.7 kg)  05/25/20 135 lb 3.2 oz (61.3 kg)    Lab Results  Component Value Date   WBC 3.2 (L) 05/25/2020   HGB 13.7 05/25/2020   HCT 40.2 05/25/2020   PLT 265 05/25/2020   GLUCOSE 82 04/23/2020   CHOL 226 (H) 04/23/2020   TRIG 116.0 04/23/2020   HDL  69.90 04/23/2020   LDLCALC 133 (H) 04/23/2020   ALT 30 04/23/2020   AST 28 04/23/2020   NA 139 04/23/2020   K 4.3 04/23/2020   CL 102 04/23/2020   CREATININE 0.52 04/23/2020   BUN 7 04/23/2020   CO2 31 04/23/2020   TSH 0.92 04/23/2020   HGBA1C 4.6 04/23/2020    Assessment/Plan:  Cervical radiculopathy  -Discussed trapezius muscle tension likely causing compression of nerve exiting cervical spine.  Also consider bone spurs, arthritis -Discussed supportive care including  NSAIDs, heat, stretching, massage -We will obtain imaging -Discussed using Robaxin at night this may make sleepy. -Consider prednisone taper and physical therapy - Plan: DG Cervical Spine Complete, methocarbamol (ROBAXIN) 500 MG tablet  Digestive problems -Continue low FODMAP diet -Continue probiotics -Continue follow-up with GI  F/u as needed  Grier Mitts, MD

## 2020-09-10 NOTE — Patient Instructions (Signed)
Cervical Radiculopathy  Cervical radiculopathy happens when a nerve in the neck (a cervical nerve) is pinched or bruised. This condition can happen because of an injury to the cervical spine (vertebrae) in the neck, or as part of the normal aging process. Pressure on the cervical nerves can cause pain or numbness that travels from the neck all the way down into the arm and fingers. Usually, this condition gets better with rest. Treatment may be needed if the condition does not improve. What are the causes? This condition may be caused by:  A neck injury.  A bulging (herniated) disk.  Muscle spasms.  Muscle tightness in the neck because of overuse.  Arthritis.  Breakdown or degeneration in the bones and joints of the spine (spondylosis) due to aging.  Bone spurs that may develop near the cervical nerves. What are the signs or symptoms? Symptoms of this condition include:  Pain. The pain may travel from the neck to the arm and hand. The pain can be severe or irritating. It may be worse when you move your neck.  Numbness or tingling in your arm or hand.  Weakness in the affected arm and hand, in severe cases. How is this diagnosed? This condition may be diagnosed based on your symptoms, your medical history, and a physical exam. You may also have tests, including:  X-rays.  A CT scan.  An MRI.  An electromyogram (EMG).  Nerve conduction tests. How is this treated? In many cases, treatment is not needed for this condition. With rest, the condition usually gets better over time. If treatment is needed, options may include:  Wearing a soft neck collar (cervical collar) for short periods of time, as told by your health care provider.  Doing physical therapy to strengthen your neck muscles.  Taking medicines, such as NSAIDs or oral corticosteroids.  Having spinal injections, in severe cases.  Having surgery. This may be needed if other treatments do not help. Different types  of surgery may be done depending on the cause of this condition. Follow these instructions at home: If you have a cervical collar:  Wear it as told by your health care provider. Remove it only as told by your health care provider.  Ask your health care provider if you can remove the collar for cleaning and bathing. If you are allowed to remove the collar for cleaning or bathing: ? Follow instructions from your health care provider about how to remove the collar safely. ? Clean the collar by wiping it with mild soap and water and drying it completely. ? Take out any removable pads in the collar every 1-2 days, and wash them by hand with soap and water. Let them air-dry completely before you put them back in the collar. ? Check your skin under the collar for irritation or sores. If you see any, tell your health care provider. Managing pain      Take over-the-counter and prescription medicines only as told by your health care provider.  If directed, put ice on the affected area. ? If you have a soft neck collar, remove it as told by your health care provider. ? Put ice in a plastic bag. ? Place a towel between your skin and the bag. ? Leave the ice on for 20 minutes, 2-3 times a day.  If applying ice does not help, you can try using heat. Use the heat source that your health care provider recommends, such as a moist heat pack or a heating pad. ?  Place a towel between your skin and the heat source. ? Leave the heat on for 20-30 minutes. ? Remove the heat if your skin turns bright red. This is especially important if you are unable to feel pain, heat, or cold. You may have a greater risk of getting burned.  Try a gentle neck and shoulder massage to help relieve symptoms. Activity  Rest as needed.  Return to your normal activities as told by your health care provider. Ask your health care provider what activities are safe for you.  Do stretching and strengthening exercises as told by  your health care provider or physical therapist.  Do not lift anything that is heavier than 10 lb (4.5 kg) until your health care provider tells you that it is safe. General instructions  Use a flat pillow when you sleep.  Do not drive while wearing a cervical collar. If you do not have a cervical collar, ask your health care provider if it is safe to drive while your neck heals.  Ask your health care provider if the medicine prescribed to you requires you to avoid driving or using heavy machinery.  Do not use any products that contain nicotine or tobacco, such as cigarettes, e-cigarettes, and chewing tobacco. These can delay healing. If you need help quitting, ask your health care provider.  Keep all follow-up visits as told by your health care provider. This is important. Contact a health care provider if:  Your condition does not improve with treatment. Get help right away if:  Your pain gets much worse and cannot be controlled with medicines.  You have weakness or numbness in your hand, arm, face, or leg.  You have a high fever.  You have a stiff, rigid neck.  You lose control of your bowels or your bladder (have incontinence).  You have trouble with walking, balance, or speaking. Summary  Cervical radiculopathy happens when a nerve in the neck is pinched or bruised.  A nerve can get pinched from a bulging disk, arthritis, muscle spasms, or an injury to the neck.  Symptoms include pain, tingling, or numbness radiating from the neck into the arm or hand. Weakness can also occur in severe cases.  Treatment may include rest, wearing a cervical collar, and physical therapy. Medicines may be prescribed to help with pain. In severe cases, injections or surgery may be needed. This information is not intended to replace advice given to you by your health care provider. Make sure you discuss any questions you have with your health care provider. Document Revised: 09/14/2018  Document Reviewed: 09/14/2018 Elsevier Patient Education  [place]  FODMAPs (fermentable oligosaccharides, disaccharides, monosaccharides, and polyols) are sugars that are hard for some people to digest. A low-FODMAP eating plan may help some people who have bowel (intestinal) diseases to manage their symptoms. This meal plan can be complicated to follow. Work with a diet and nutrition specialist (dietitian) to make a low-FODMAP eating plan that is right for you. A dietitian can make sure that you get enough nutrition from this diet. What are tips for following this plan? Reading food labels  Check labels for hidden FODMAPs such as: ? High-fructose syrup. ? Honey. ? Agave. ? Natural fruit flavors. ? Onion or garlic powder.  Choose low-FODMAP foods that contain 3-4 grams of fiber per serving.  Check food labels for serving sizes. Eat only one serving at a time to make sure FODMAP levels stay low. Meal planning  Follow  a low-FODMAP eating plan for up to 6 weeks, or as told by your health care provider or dietitian.  To follow the eating plan: 1. Eliminate high-FODMAP foods from your diet completely. 2. Gradually reintroduce high-FODMAP foods into your diet one at a time. Most people should wait a few days after introducing one high-FODMAP food before they introduce the next high-FODMAP food. Your dietitian can recommend how quickly you may reintroduce foods. 3. Keep a daily record of what you eat and drink, and make note of any symptoms that you have after eating. 4. Review your daily record with a dietitian regularly. Your dietitian can help you identify which foods you can eat and which foods you should avoid. General tips  Drink enough fluid each day to keep your urine pale yellow.  Avoid processed foods. These often have added sugar and may be high in FODMAPs.  Avoid most dairy products, whole grains, and sweeteners.  Work with a dietitian to  make sure you get enough fiber in your diet. Recommended foods Grains  Gluten-free grains, such as rice, oats, buckwheat, quinoa, corn, polenta, and millet. Gluten-free pasta, bread, or cereal. Rice noodles. Corn tortillas. Vegetables  Eggplant, zucchini, cucumber, peppers, green beans, Brussels sprouts, bean sprouts, lettuce, arugula, kale, Swiss chard, spinach, collard greens, bok choy, summer squash, potato, and tomato. Limited amounts of corn, carrot, and sweet potato. Green parts of scallions. Fruits  Bananas, oranges, lemons, limes, blueberries, raspberries, strawberries, grapes, cantaloupe, honeydew melon, kiwi, papaya, passion fruit, and pineapple. Limited amounts of dried cranberries, banana chips, and shredded coconut. Dairy  Lactose-free milk, yogurt, and kefir. Lactose-free cottage cheese and ice cream. Non-dairy milks, such as almond, coconut, hemp, and rice milk. Yogurts made of non-dairy milks. Limited amounts of goat cheese, brie, mozzarella, parmesan, swiss, and other hard cheeses. Meats and other protein foods  Unseasoned beef, pork, poultry, or fish. Eggs. Berniece Salines. Tofu (firm) and tempeh. Limited amounts of nuts and seeds, such as almonds, walnuts, [place] nuts, pecans, peanuts, pumpkin seeds, chia seeds, and sunflower seeds. Fats and oils  Butter-free spreads. Vegetable oils, such as olive, canola, and sunflower oil. Seasoning and other foods  Artificial sweeteners with names that do not end in "ol" such as aspartame, saccharine, and stevia. Maple syrup, white table sugar, raw sugar, brown sugar, and molasses. Fresh basil, coriander, parsley, rosemary, and thyme. Beverages  Water and mineral water. Sugar-sweetened soft drinks. Small amounts of orange juice or cranberry juice. Black and green tea. Most dry wines. Coffee. This may not be a complete list of low-FODMAP foods. Talk with your dietitian for more information. Foods to avoid Grains  Wheat, including kamut,  durum, and semolina. Barley and bulgur. Couscous. Wheat-based cereals. Wheat noodles, bread, crackers, and pastries. Vegetables  Chicory root, artichoke, asparagus, cabbage, snow peas, sugar snap peas, mushrooms, and cauliflower. Onions, garlic, leeks, and the white part of scallions. Fruits  Fresh, dried, and juiced forms of apple, pear, watermelon, peach, plum, cherries, apricots, blackberries, boysenberries, figs, nectarines, and mango. Avocado. Dairy  Milk, yogurt, ice cream, and soft cheese. Cream and sour cream. Milk-based sauces. Custard. Meats and other protein foods  Fried or fatty meat. Sausage. Cashews and pistachios. Soybeans, baked beans, black beans, chickpeas, kidney beans, fava beans, navy beans, lentils, and split peas. Seasoning and other foods  Any sugar-free gum or candy. Foods that contain artificial sweeteners such as sorbitol, mannitol, isomalt, or xylitol. Foods that contain honey, high-fructose corn syrup, or agave. Bouillon, vegetable stock, beef stock, and chicken stock.  Garlic and onion powder. Condiments made with onion, such as hummus, chutney, pickles, relish, salad dressing, and salsa. Tomato paste. Beverages  Chicory-based drinks. Coffee substitutes. Chamomile tea. Fennel tea. Sweet or fortified wines such as port or sherry. Diet soft drinks made with isomalt, mannitol, maltitol, sorbitol, or xylitol. Apple, pear, and mango juice. Juices with high-fructose corn syrup. This may not be a complete list of high-FODMAP foods. Talk with your dietitian to discuss what dietary choices are best for you.  Summary  A low-FODMAP eating plan is a short-term diet that eliminates FODMAPs from your diet to help ease symptoms of certain bowel diseases.  The eating plan usually lasts up to 6 weeks. After that, high-FODMAP foods are restarted gradually, one at a time, so you can find out which may be causing symptoms.  A low-FODMAP eating plan can be complicated. It is best to  work with a dietitian who has experience with this type of plan. This information is not intended to replace advice given to you by your health care provider. Make sure you discuss any questions you have with your health care provider. Document Revised: 10/06/2017 Document Reviewed: 06/20/2017 Elsevier Patient Education  [place].

## 2020-10-09 ENCOUNTER — Other Ambulatory Visit: Payer: Self-pay

## 2020-10-09 ENCOUNTER — Ambulatory Visit
Admission: RE | Admit: 2020-10-09 | Discharge: 2020-10-09 | Disposition: A | Discharge status: Home / Self Care | Payer: BC Managed Care – PPO | Source: Ambulatory Visit | Attending: Obstetrics and Gynecology | Admitting: Obstetrics and Gynecology

## 2020-10-09 DIAGNOSIS — Z1231 Encounter for screening mammogram for malignant neoplasm of breast: Secondary | ICD-10-CM

## 2020-10-09 IMAGING — MG DIGITAL SCREENING BILAT W/ TOMO W/ CAD
8 series · 9 of 24 positions shown · non-contrast
Comparison: Previous exam(s).

CLINICAL DATA: Screening.

EXAM:
DIGITAL SCREENING BILATERAL MAMMOGRAM WITH TOMO AND CAD

[R MLO synth-2D]
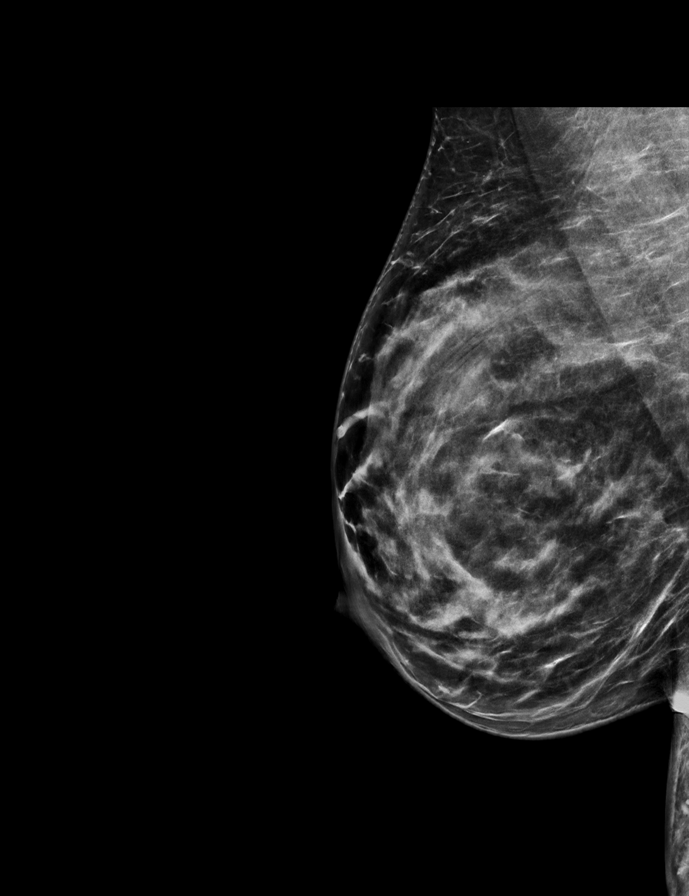

[L CC synth-2D]
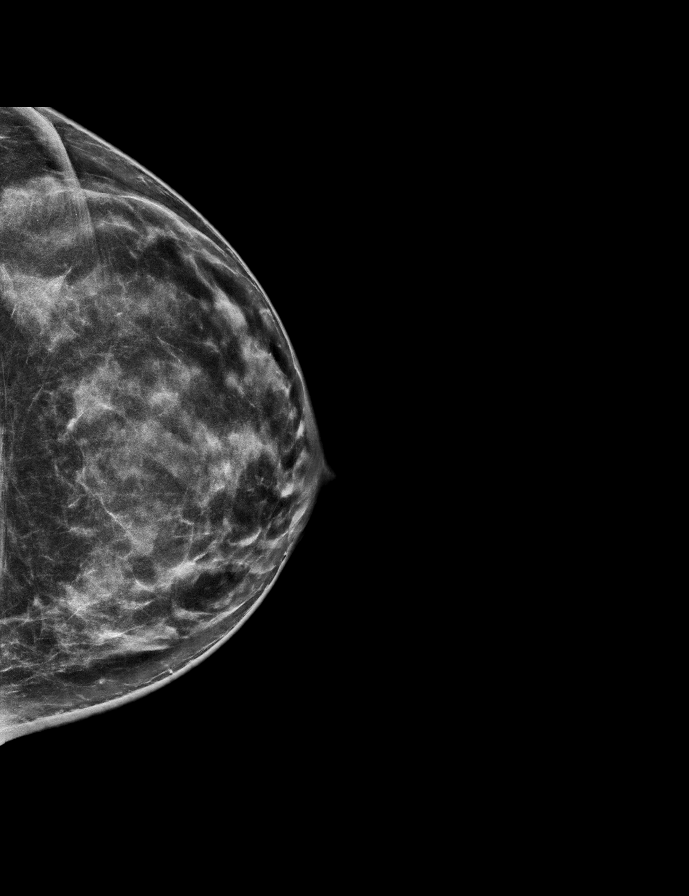

[R CC synth-2D]
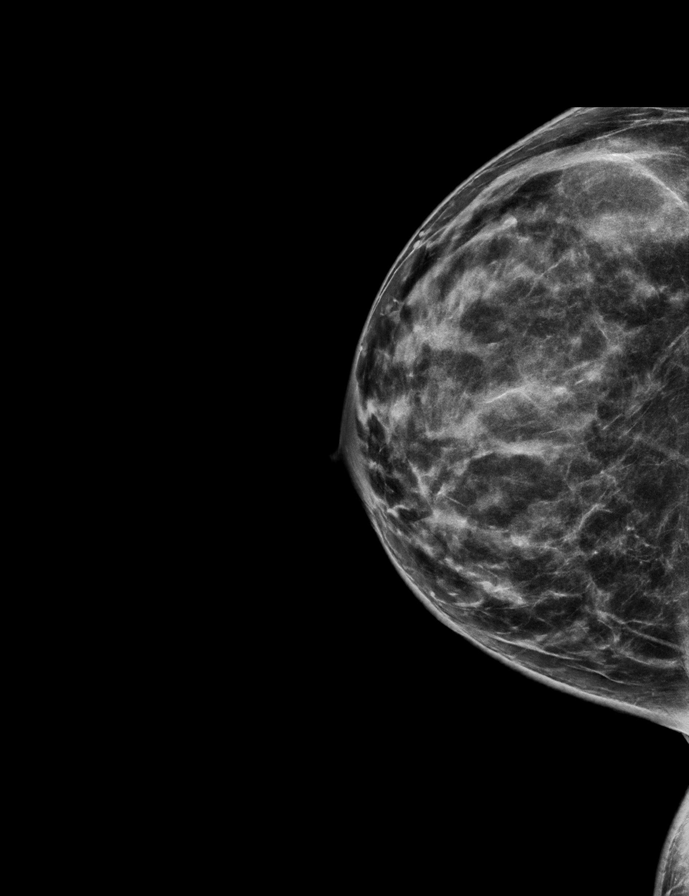

[L MLO synth-2D]
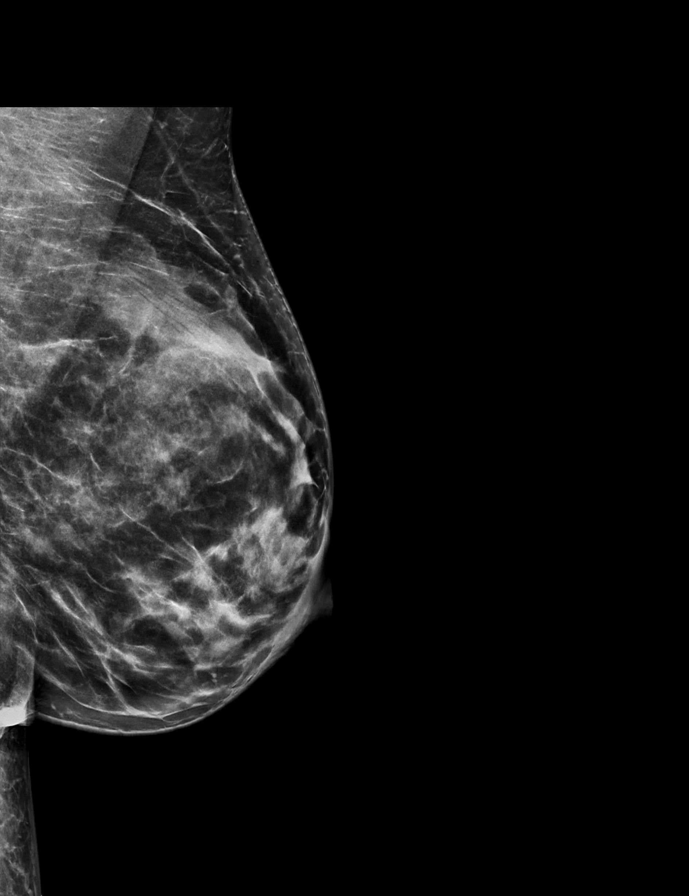

[R MLO tomo · 2 of 68 frames shown]
[frame 22/68]
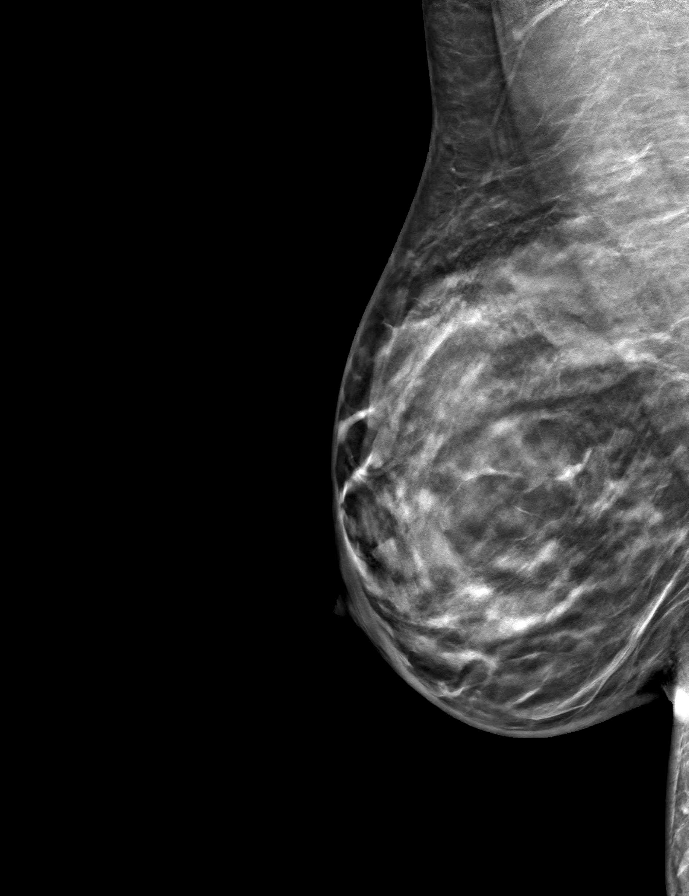
[frame 35/68]
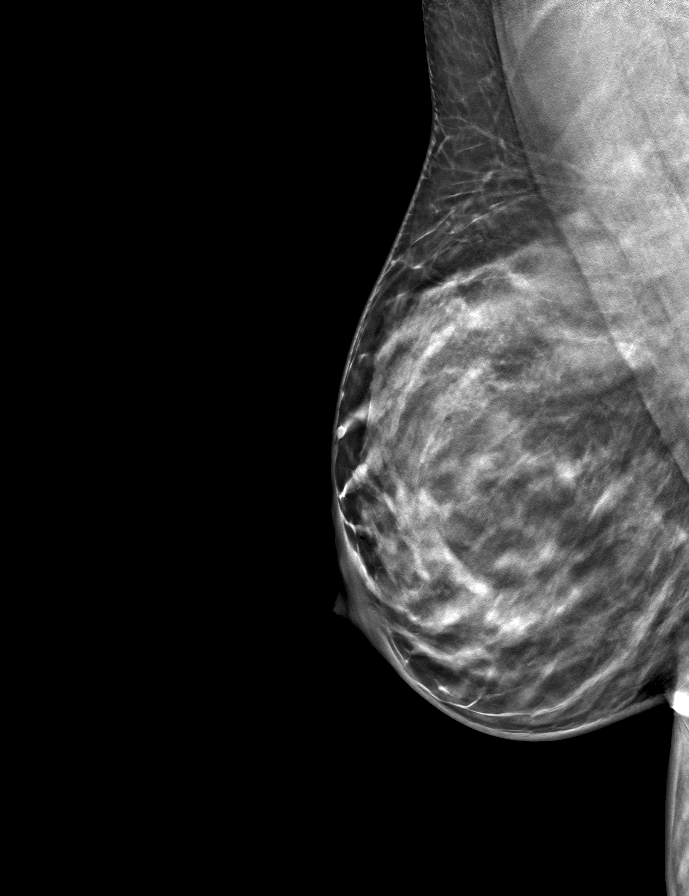

[R CC tomo · tomo slice 34/67.0]
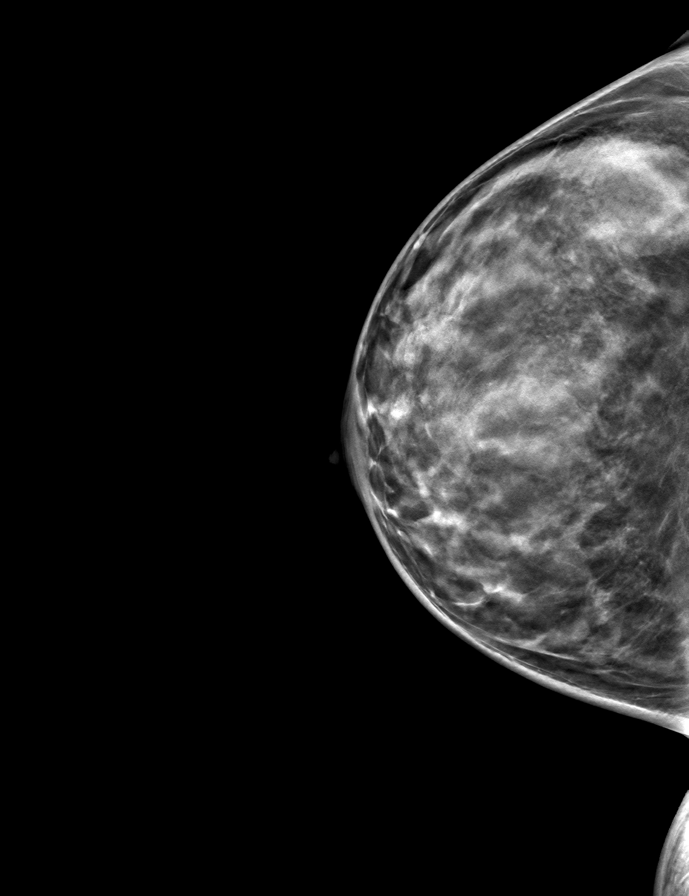

[L CC tomo · tomo slice 35/70.0]
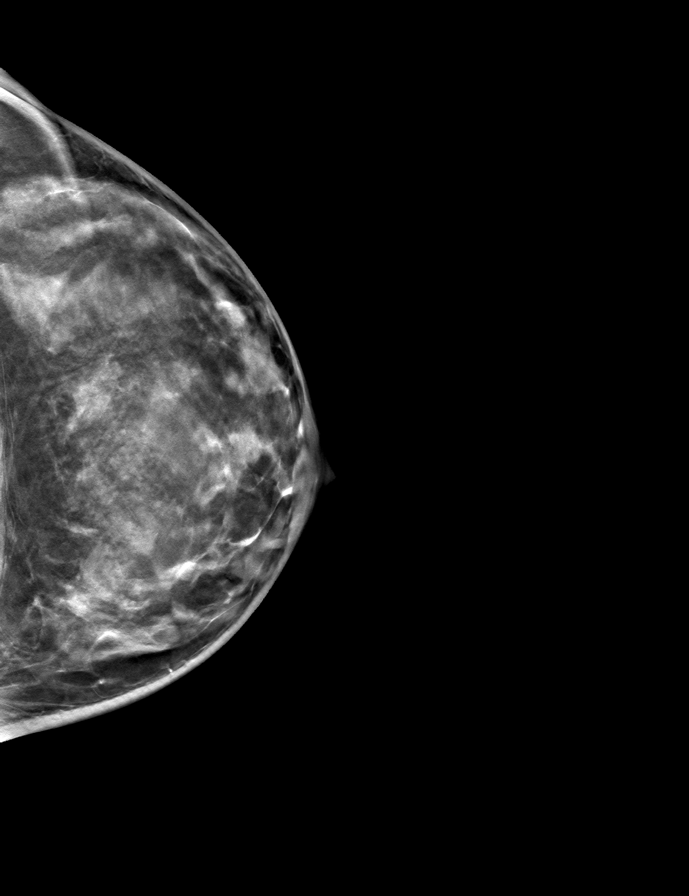

[L MLO tomo · tomo slice 35/69.0]
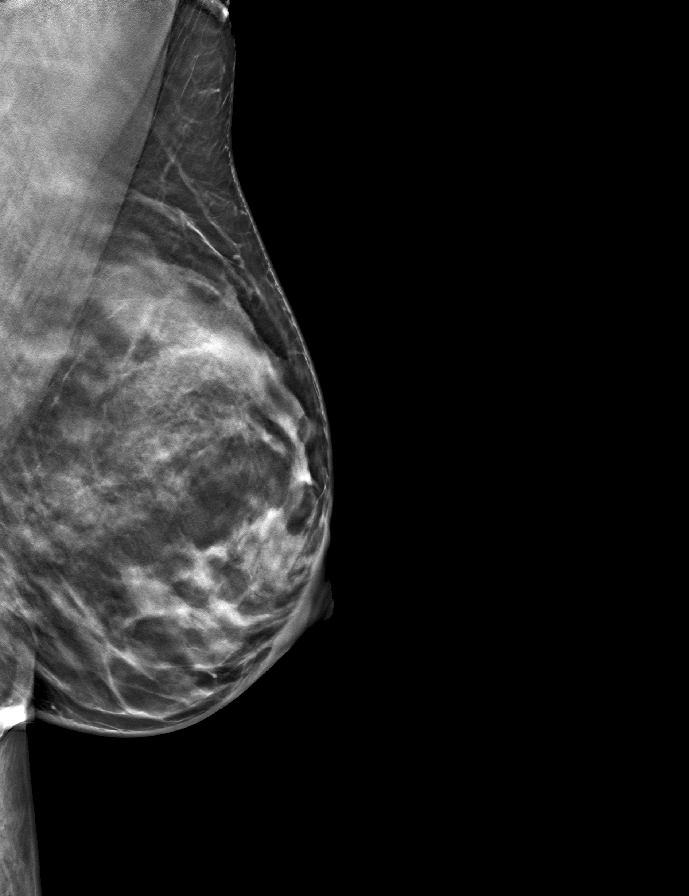

[9 of 24 positions shown; findings below may reference images not displayed]

ACR Breast Density Category c: The breast tissue is heterogeneously
dense, which may obscure small masses.
FINDINGS: There are no findings suspicious for malignancy. Images were
processed with CAD.
IMPRESSION: No mammographic evidence of malignancy. A result letter of this
screening mammogram will be mailed directly to the patient.

RECOMMENDATION:
Screening mammogram in one year. (Code:[5V])

BI-RADS CATEGORY  1: Negative.

## 2020-10-15 ENCOUNTER — Ambulatory Visit: Payer: BC Managed Care – PPO | Attending: Internal Medicine

## 2020-10-15 DIAGNOSIS — Z23 Encounter for immunization: Secondary | ICD-10-CM

## 2020-10-15 NOTE — Progress Notes (Signed)
   Covid-19 Vaccination Clinic  Name:  Meredith Lee    MRN: [id number] DOB: 06/16/1976  10/15/2020  [name] was observed post Covid-19 immunization for 15 minutes without incident. She was provided with Vaccine Information Sheet and instruction to access the V-Safe system.   [name] was instructed to call 911 with any severe reactions post vaccine: Marland Kitchen Difficulty breathing  . Swelling of face and throat  . A fast heartbeat  . A bad rash all over body  . Dizziness and weakness   Immunizations Administered    Name Date Dose VIS Date Route   Pfizer COVID-19 Vaccine 10/15/2020  1:16 PM 0.3 mL 08/26/2020 Intramuscular   Manufacturer: [employer]   Lot: [id number]   Daniel: [id number]

## 2020-11-02 ENCOUNTER — Telehealth: Payer: Self-pay | Admitting: Family Medicine

## 2020-11-02 NOTE — Telephone Encounter (Signed)
Patient is requesting a referral to Orthopedics.  Please call when entered.

## 2020-11-03 ENCOUNTER — Telehealth: Payer: Self-pay | Admitting: Family Medicine

## 2020-11-03 NOTE — Telephone Encounter (Signed)
Patient is calling and requesting a refill for cyclobenzaprine (FLEXERIL) 5 MG tablet sent to [id number] [address], [place] [address]  Phone:  [phone] Fax:  [phone] CB is [phone]

## 2020-11-04 ENCOUNTER — Other Ambulatory Visit: Payer: Self-pay | Admitting: Family Medicine

## 2020-11-04 DIAGNOSIS — M5412 Radiculopathy, cervical region: Secondary | ICD-10-CM

## 2020-11-04 NOTE — Telephone Encounter (Signed)
Referral placed.

## 2020-11-04 NOTE — Telephone Encounter (Signed)
Pt LOV was on 09/10/2020 and Last refill was done on 09/13/2020 for 30 tablets. Ok to refill

## 2020-11-05 NOTE — Telephone Encounter (Signed)
Pt notified through MyChart portal 

## 2020-12-28 ENCOUNTER — Other Ambulatory Visit: Payer: Self-pay

## 2020-12-28 ENCOUNTER — Telehealth: Payer: Self-pay | Admitting: Family Medicine

## 2020-12-28 DIAGNOSIS — M5412 Radiculopathy, cervical region: Secondary | ICD-10-CM

## 2020-12-28 MED ORDER — CYCLOBENZAPRINE HCL 5 MG PO TABS: 5.0000 mg | ORAL_TABLET | Freq: Every evening | ORAL | 0 refills | Status: DC | PRN

## 2020-12-28 NOTE — Telephone Encounter (Signed)
Pt is calling in needing a refill on her muscle relaxant due to her other muscles being over worked and she is not able to do her PT due to those muscles and she was diagnosed with frozen shoulder (L)  with on last week and she has been doing PT doing it once a week.  [facility] [address]  Pt stated that she really would like to have the medication so that she is able to do her PT.

## 2020-12-29 NOTE — Telephone Encounter (Signed)
Rx sent to pt pharmacy 

## 2021-02-18 HISTORY — PX: SHOULDER ADHESION RELEASE: SHX773

## 2021-03-26 ENCOUNTER — Other Ambulatory Visit: Payer: Self-pay

## 2021-03-26 ENCOUNTER — Telehealth: Payer: Self-pay | Admitting: Family Medicine

## 2021-03-26 DIAGNOSIS — M5412 Radiculopathy, cervical region: Secondary | ICD-10-CM

## 2021-03-26 MED ORDER — CYCLOBENZAPRINE HCL 5 MG PO TABS: 5.0000 mg | ORAL_TABLET | Freq: Every evening | ORAL | 0 refills | Status: DC | PRN

## 2021-03-26 NOTE — Telephone Encounter (Signed)
Patient is calling and requesting a refill for cyclobenzaprine (FLEXERIL) 5 MG tablet to be sent to   [id number] [address], [place], [place] [address]  Phone:  [phone] Fax:  [phone] CB is [phone]

## 2021-03-26 NOTE — Telephone Encounter (Signed)
Refill sent.

## 2021-05-19 ENCOUNTER — Ambulatory Visit: Payer: BC Managed Care – PPO

## 2021-06-23 NOTE — Progress Notes (Signed)
45 y.o. G0P0000 Significant Other Black or African American Not Hispanic or Latino female here for annual exam.     She had a total laparoscopic hysterectomy, LSO, RS and extensive LOA in 10/20.  She has occasional vasomotor symptoms, not currently. No vaginal dryness.   She has a low libido, able to orgasm. She has a live in partner, together x 2 years. They are getting married (unofficially) next April.   She has had a frozen left shoulder, had surgery in 4/22. In PT.   She has Fodmap diet and her bowels have improved. Normal bowel movements on this diet.   No bladder c/o.   She c/o weight gain, eating healthier.  She c/o pain over her right lower rib cage.   Patient's last menstrual period was 07/15/2019.          Sexually active: Yes.    The current method of family planning is status post hysterectomy.    Exercising: Yes.     Aerobics Smoker: no  Health Maintenance: Pap:  06/18/20 Negative, negative hpv;  12-08-17 ASCUS HPV HR +, negative colposcopy. 06-13-2019 neg HPV HR neg History of abnormal Pap:  yes MMG:  10/09/20 density C Bi-rads 1 neg  BMD:   none  Colonoscopy: 07-21-20 TDaP:  unsure  Gardasil: no    reports that she has quit smoking. Her smoking use included cigars. She has never used smokeless tobacco. She reports current alcohol use. She reports that she does not use drugs.She is teaching at [employer], [employer] and [place]  Past Medical History:  Diagnosis Date   Amenorrhea    Depression    Family history of breast cancer    Family history of colon cancer    Fibroid    Heart murmur    as a child   Menstrual cramps    Ovarian cyst     Past Surgical History:  Procedure Laterality Date   CYSTOSCOPY N/A 08/19/2019   Procedure: CYSTOSCOPY;  Surgeon: Salvadore Dom, MD;  Location: [facility];  Service: Gynecology;  Laterality: N/A;   LYSIS OF ADHESION N/A 08/19/2019   Procedure: LYSIS OF ADHESION;  Surgeon: Salvadore Dom, MD;  Location:  [facility];  Service: Gynecology;  Laterality: N/A;   MYOMECTOMY  2012   SHOULDER ADHESION RELEASE Left 02/18/2021   Dr.Timothy Percell Miller   TOTAL LAPAROSCOPIC HYSTERECTOMY WITH SALPINGECTOMY Bilateral 08/19/2019   Procedure: TOTAL LAPAROSCOPIC HYSTERECTOMY WITH BILATERAL SALPINGECTOMY,LEFT OOPHORECTOMY, EXTENSIVE LYSIS OF ADHESIONS;  Surgeon: Salvadore Dom, MD;  Location: [facility];  Service: Gynecology;  Laterality: Bilateral;    Current Outpatient Medications  Medication Sig Dispense Refill   cyclobenzaprine (FLEXERIL) 5 MG tablet Take 1 tablet (5 mg total) by mouth at bedtime as needed for muscle spasms. 30 tablet 0   MAGNESIUM PO Take 500 mg by mouth daily.      Prenatal Vit-Fe Fumarate-FA (PRENATAL MULTIVITAMIN) TABS tablet Take 1 tablet by mouth daily at 12 noon.     VITAMIN D PO Take 5,000 Units by mouth daily.      No current facility-administered medications for this visit.    Family History  Problem Relation Age of Onset   Fibroids Mother    Colon polyps Mother    Diabetes Father    Fibroids Sister    Colon cancer Maternal Grandmother    Lung cancer Maternal Grandmother    Breast cancer Maternal Grandmother        dx <50   Cancer  Maternal Grandfather    Breast cancer Paternal Grandmother 38   Heart attack Paternal Grandmother    Breast cancer Paternal Aunt 46       recurrance at 6   Brain cancer Paternal Aunt    Obesity Maternal Aunt    Diabetes Maternal Aunt    Heart failure Maternal Aunt    Heart disease Paternal Uncle     Review of Systems  Genitourinary:        Low libido   All other systems reviewed and are negative.  Exam:   BP 110/70 (BP Location: Right Arm, Patient Position: Sitting, Cuff Size: Normal)   Pulse 80   Resp 14   Ht 5' 3.5" (1.613 m)   Wt 142 lb (64.4 kg)   LMP 07/15/2019   BMI 24.76 kg/m   Weight change: '@WEIGHTCHANGE'$ @ Height:   Height: 5' 3.5" (161.3 cm)  Ht Readings from Last 3 Encounters:   06/24/21 5' 3.5" (1.613 m)  06/18/20 5' 3.5" (1.613 m)  05/25/20 '5\' 3"'$  (1.6 m)    General appearance: alert, cooperative and appears stated age Head: Normocephalic, without obvious abnormality, atraumatic Neck: no adenopathy, supple, symmetrical, trachea midline and thyroid normal to inspection and palpation Lungs: clear to auscultation bilaterally Cardiovascular: regular rate and rhythm Breasts: normal appearance, no masses or tenderness Tender in the lower, anterior rib cage Abdomen: soft, non-tender; non distended,  no masses,  no organomegaly Extremities: extremities normal, atraumatic, no cyanosis or edema Skin: Skin color, texture, turgor normal. No rashes or lesions Lymph nodes: Cervical, supraclavicular, and axillary nodes normal. No abnormal inguinal nodes palpated Neurologic: Grossly normal   Pelvic: External genitalia:  no lesions              Urethra:  normal appearing urethra with no masses, tenderness or lesions              Bartholins and Skenes: normal                 Vagina: normal appearing vagina with normal color and discharge, no lesions              Cervix: absent               Bimanual Exam:  Uterus:  uterus absent              Adnexa: no mass, fullness, tenderness               Rectovaginal: Confirms               Anus:  normal sphincter tone, no lesions  Karmen Bongo chaperoned for the exam.  1. Well woman exam Discussed breast self exam Discussed calcium and vit D intake  2. Increased risk of breast cancer Mammogram in 12/22 Will order breast MRI (discussed contrast)  3. Family history of diabetes mellitus (DM) - Hemoglobin A1c  4. Status post laparoscopic hysterectomy  5. Laboratory exam ordered as part of routine general medical examination - CBC - Comprehensive metabolic panel - Lipid panel  6. Vitamin D deficiency - VITAMIN D 25 Hydroxy (Vit-D Deficiency, Fractures)  7. Weight gain - TSH  8. Low libido - Testos,Total,Free and  SHBG (Female)  9. Rib pain on right side Given information, try ice/heat, ibuprofen.  F/u with primary if it persists.

## 2021-06-24 ENCOUNTER — Encounter: Payer: Self-pay | Admitting: Obstetrics and Gynecology

## 2021-06-24 ENCOUNTER — Other Ambulatory Visit: Payer: Self-pay

## 2021-06-24 ENCOUNTER — Other Ambulatory Visit: Payer: Self-pay | Admitting: Obstetrics and Gynecology

## 2021-06-24 ENCOUNTER — Ambulatory Visit (INDEPENDENT_AMBULATORY_CARE_PROVIDER_SITE_OTHER): Payer: BC Managed Care – PPO | Admitting: Obstetrics and Gynecology

## 2021-06-24 VITALS — BP 110/70 | HR 80 | Resp 14 | Ht 63.5 in | Wt 142.0 lb

## 2021-06-24 DIAGNOSIS — R0781 Pleurodynia: Secondary | ICD-10-CM

## 2021-06-24 DIAGNOSIS — Z9189 Other specified personal risk factors, not elsewhere classified: Secondary | ICD-10-CM | POA: Diagnosis not present

## 2021-06-24 DIAGNOSIS — R635 Abnormal weight gain: Secondary | ICD-10-CM

## 2021-06-24 DIAGNOSIS — E559 Vitamin D deficiency, unspecified: Secondary | ICD-10-CM

## 2021-06-24 DIAGNOSIS — Z01419 Encounter for gynecological examination (general) (routine) without abnormal findings: Secondary | ICD-10-CM | POA: Diagnosis not present

## 2021-06-24 DIAGNOSIS — R6882 Decreased libido: Secondary | ICD-10-CM

## 2021-06-24 DIAGNOSIS — Z90710 Acquired absence of both cervix and uterus: Secondary | ICD-10-CM

## 2021-06-24 DIAGNOSIS — Z833 Family history of diabetes mellitus: Secondary | ICD-10-CM

## 2021-06-24 DIAGNOSIS — Z0000 Encounter for general adult medical examination without abnormal findings: Secondary | ICD-10-CM

## 2021-06-24 NOTE — Addendum Note (Signed)
Addended by: Joaquin Music on: 06/24/2021 03:11 PM   Modules accepted: Orders

## 2021-06-24 NOTE — Patient Instructions (Signed)
EXERCISE   We recommended that you start or continue a regular exercise program for good health. Physical activity is anything that gets your body moving, some is better than none. The CDC recommends 150 minutes per week of Moderate-Intensity Aerobic Activity and 2 or more days of Muscle Strengthening Activity.  Benefits of exercise are limitless: helps weight loss/weight maintenance, improves mood and energy, helps with depression and anxiety, improves sleep, tones and strengthens muscles, improves balance, improves bone density, protects from chronic conditions such as heart disease, high blood pressure and diabetes and so much more. To learn more visit: https://www.cdc.gov/physicalactivity/index.html  DIET: Good nutrition starts with a healthy diet of fruits, vegetables, whole grains, and lean protein sources. Drink plenty of water for hydration. Minimize empty calories, sodium, sweets. For more information about dietary recommendations visit: https://health.gov/our-work/nutrition-physical-activity/dietary-guidelines and https://www.myplate.gov/  ALCOHOL:  Women should limit their alcohol intake to no more than 7 drinks/beers/glasses of wine (combined, not each!) per week. Moderation of alcohol intake to this level decreases your risk of breast cancer and liver damage.  If you are concerned that you may have a problem, or your friends have told you they are concerned about your drinking, there are many resources to help. A well-known program that is free, effective, and available to all people all over the nation is Alcoholics Anonymous.  Check out this site to learn more: https://www.aa.org/   CALCIUM AND VITAMIN D:  Adequate intake of calcium and Vitamin D are recommended for bone health.  You should be getting between 1000-1200 mg of calcium and 800 units of Vitamin D daily between diet and supplements  PAP SMEARS:  Pap smears, to check for cervical cancer or precancers,  have traditionally been  done yearly, scientific advances have shown that most women can have pap smears less often.  However, every woman still should have a physical exam from her gynecologist every year. It will include a breast check, inspection of the vulva and vagina to check for abnormal growths or skin changes, a visual exam of the cervix, and then an exam to evaluate the size and shape of the uterus and ovaries. We will also provide age appropriate advice regarding health maintenance, like when you should have certain vaccines, screening for sexually transmitted diseases, bone density testing, colonoscopy, mammograms, etc.   MAMMOGRAMS:  All women over 40 years old should have a routine mammogram.   COLON CANCER SCREENING: Now recommend starting at age 45. At this time colonoscopy is not covered for routine screening until 50. There are take home tests that can be done between 45-49.   COLONOSCOPY:  Colonoscopy to screen for colon cancer is recommended for all women at age 50.  We know, you hate the idea of the prep.  We agree, BUT, having colon cancer and not knowing it is worse!!  Colon cancer so often starts as a polyp that can be seen and removed at colonscopy, which can quite literally save your life!  And if your first colonoscopy is normal and you have no family history of colon cancer, most women don't have to have it again for 10 years.  Once every ten years, you can do something that may end up saving your life, right?  We will be happy to help you get it scheduled when you are ready.  Be sure to check your insurance coverage so you understand how much it will cost.  It may be covered as a preventative service at no cost, but you should check   your particular policy.      Breast Self-Awareness Breast self-awareness means being familiar with how your breasts look and feel. It involves checking your breasts regularly and reporting any changes to your health care provider. Practicing breast self-awareness is  important. A change in your breasts can be a sign of a serious medical problem. Being familiar with how your breasts look and feel allows you to find any problems early, when treatment is more likely to be successful. All women should practice breast self-awareness, including women who have had breast implants. How to do a breast self-exam One way to learn what is normal for your breasts and whether your breasts are changing is to do a breast self-exam. To do a breast self-exam: Look for Changes  Remove all the clothing above your waist. Stand in front of a mirror in a room with good lighting. Put your hands on your hips. Push your hands firmly downward. Compare your breasts in the mirror. Look for differences between them (asymmetry), such as: Differences in shape. Differences in size. Puckers, dips, and bumps in one breast and not the other. Look at each breast for changes in your skin, such as: Redness. Scaly areas. Look for changes in your nipples, such as: Discharge. Bleeding. Dimpling. Redness. A change in position. Feel for Changes Carefully feel your breasts for lumps and changes. It is best to do this while lying on your back on the floor and again while sitting or standing in the shower or tub with soapy water on your skin. Feel each breast in the following way: Place the arm on the side of the breast you are examining above your head. Feel your breast with the other hand. Start in the nipple area and make  inch (2 cm) overlapping circles to feel your breast. Use the pads of your three middle fingers to do this. Apply light pressure, then medium pressure, then firm pressure. The light pressure will allow you to feel the tissue closest to the skin. The medium pressure will allow you to feel the tissue that is a little deeper. The firm pressure will allow you to feel the tissue close to the ribs. Continue the overlapping circles, moving downward over the breast until you feel your  ribs below your breast. Move one finger-width toward the center of the body. Continue to use the  inch (2 cm) overlapping circles to feel your breast as you move slowly up toward your collarbone. Continue the up and down exam using all three pressures until you reach your armpit.  Write Down What You Find  Write down what is normal for each breast and any changes that you find. Keep a written record with breast changes or normal findings for each breast. By writing this information down, you do not need to depend only on memory for size, tenderness, or location. Write down where you are in your menstrual cycle, if you are still menstruating. If you are having trouble noticing differences in your breasts, do not get discouraged. With time you will become more familiar with the variations in your breasts and more comfortable with the exam. How often should I examine my breasts? Examine your breasts every month. If you are breastfeeding, the best time to examine your breasts is after a feeding or after using a breast pump. If you menstruate, the best time to examine your breasts is 5-7 days after your period is over. During your period, your breasts are lumpier, and it may be more   difficult to notice changes. When should I see my health care provider? See your health care provider if you notice: A change in shape or size of your breasts or nipples. A change in the skin of your breast or nipples, such as a reddened or scaly area. Unusual discharge from your nipples. A lump or thick area that was not there before. Pain in your breasts. Anything that concerns you. Chest Wall Pain Chest wall pain is pain in or around the bones and muscles of your chest. Sometimes, an injury causes this pain. Excessive coughing or overuse of arm and chest muscles may also cause chest wall pain. Sometimes, the cause may not beknown. This pain may take several weeks or longer to get better. Follow these instructions at  home: Managing pain, stiffness, and swelling  If directed, put ice on the painful area: Put ice in a plastic bag. Place a towel between your skin and the bag. Leave the ice on for 20 minutes, 2-3 times per day.  Activity Rest as told by your health care provider. Avoid activities that cause pain. These include any activities that use your chest muscles or your abdominal and side muscles to lift heavy items. Ask your health care provider what activities are safe for you. General instructions  Take over-the-counter and prescription medicines only as told by your health care provider. Do not use any products that contain nicotine or tobacco, such as cigarettes, e-cigarettes, and chewing tobacco. These can delay healing after injury. If you need help quitting, ask your health care provider. Keep all follow-up visits as told by your health care provider. This is important.  Contact a health care provider if: You have a fever. Your chest pain becomes worse. You have new symptoms. Get help right away if: You have nausea or vomiting. You feel sweaty or light-headed. You have a cough with mucus from your lungs (sputum) or you cough up blood. You develop shortness of breath. These symptoms may represent a serious problem that is an emergency. Do not wait to see if the symptoms will go away. Get medical help right away. Call your local emergency services (911 in the [place] Do not drive yourself to the hospital. Summary Chest wall pain is pain in or around the bones and muscles of your chest. Depending on the cause, it may be treated with ice, rest, medicines, and avoiding activities that cause pain. Contact a health care provider if you have a fever, worsening chest pain, or new symptoms. Get help right away if you feel light-headed or you develop shortness of breath. These symptoms may be an emergency. This information is not intended to replace advice given to you by your health care provider.  Make sure you discuss any questions you have with your healthcare provider. Document Revised: 04/26/2018 Document Reviewed: 04/26/2018 Elsevier Patient Education  2022 [employer].

## 2021-06-25 ENCOUNTER — Telehealth: Payer: Self-pay | Admitting: *Deleted

## 2021-06-25 DIAGNOSIS — Z9189 Other specified personal risk factors, not elsewhere classified: Secondary | ICD-10-CM

## 2021-06-25 NOTE — Telephone Encounter (Signed)
Order placed at [facility], left message for patient to call to provide the number to call and schedule.

## 2021-06-25 NOTE — Telephone Encounter (Signed)
-----   Message from Salvadore Dom, MD sent at 06/24/2021 11:29 AM EDT ----- Risk of breast cancer is 23%, please set her up for a breast MRI. Thanks, Sharee Pimple

## 2021-07-01 ENCOUNTER — Other Ambulatory Visit: Payer: Self-pay

## 2021-07-01 ENCOUNTER — Other Ambulatory Visit: Payer: BC Managed Care – PPO

## 2021-07-01 DIAGNOSIS — R6882 Decreased libido: Secondary | ICD-10-CM

## 2021-07-01 DIAGNOSIS — Z833 Family history of diabetes mellitus: Secondary | ICD-10-CM

## 2021-07-01 DIAGNOSIS — E559 Vitamin D deficiency, unspecified: Secondary | ICD-10-CM

## 2021-07-01 DIAGNOSIS — R635 Abnormal weight gain: Secondary | ICD-10-CM

## 2021-07-01 DIAGNOSIS — Z0000 Encounter for general adult medical examination without abnormal findings: Secondary | ICD-10-CM

## 2021-07-02 ENCOUNTER — Encounter: Payer: Self-pay | Admitting: *Deleted

## 2021-07-02 LAB — LIPID PANEL
Cholesterol: 275 mg/dL — ABNORMAL HIGH (ref <200)
HDL: 68 mg/dL (ref >=50)
LDL Cholesterol (Calc): 182 mg/dL (calc) — ABNORMAL HIGH
Non-HDL Cholesterol (Calc): 207 mg/dL (calc) — ABNORMAL HIGH (ref <130)
Total CHOL/HDL Ratio: 4 (calc) (ref <5.0)
Triglycerides: 118 mg/dL (ref <150)

## 2021-07-02 LAB — COMPREHENSIVE METABOLIC PANEL
AG Ratio: 1.6 (calc) (ref 1.0–2.5)
ALT: 17 U/L (ref 6–29)
AST: 16 U/L (ref 10–30)
Albumin: 4.7 g/dL (ref 3.6–5.1)
Alkaline phosphatase (APISO): 99 U/L (ref 31–125)
BUN: 9 mg/dL (ref 7–25)
CO2: 26 mmol/L (ref 20–32)
Calcium: 10 mg/dL (ref 8.6–10.2)
Chloride: 103 mmol/L (ref 98–110)
Creat: 0.59 mg/dL (ref 0.50–0.99)
Globulin: 3 g/dL (calc) (ref 1.9–3.7)
Glucose, Bld: 88 mg/dL (ref 65–99)
Potassium: 4.1 mmol/L (ref 3.5–5.3)
Sodium: 138 mmol/L (ref 135–146)
Total Bilirubin: 0.4 mg/dL (ref 0.2–1.2)
Total Protein: 7.7 g/dL (ref 6.1–8.1)

## 2021-07-02 LAB — CBC
HCT: 41.7 % (ref 35.0–45.0)
Hemoglobin: 14.1 g/dL (ref 11.7–15.5)
MCH: 32 pg (ref 27.0–33.0)
MCHC: 33.8 g/dL (ref 32.0–36.0)
MCV: 94.6 fL (ref 80.0–100.0)
MPV: 11.1 fL (ref 7.5–12.5)
Platelets: 250 10*3/uL (ref 140–400)
RBC: 4.41 10*6/uL (ref 3.80–5.10)
RDW: 11.7 % (ref 11.0–15.0)
WBC: 3 10*3/uL — ABNORMAL LOW (ref 3.8–10.8)

## 2021-07-02 LAB — HEMOGLOBIN A1C
Hgb A1c MFr Bld: 4.9 % of total Hgb (ref <5.7)
Mean Plasma Glucose: 94 mg/dL
eAG (mmol/L): 5.2 mmol/L

## 2021-07-02 LAB — TSH: TSH: 1.25 mIU/L

## 2021-07-02 LAB — VITAMIN D 25 HYDROXY (VIT D DEFICIENCY, FRACTURES): Vit D, 25-Hydroxy: 32 ng/mL (ref 30–100)

## 2021-07-07 ENCOUNTER — Other Ambulatory Visit: Payer: Self-pay

## 2021-07-08 ENCOUNTER — Encounter: Payer: Self-pay | Admitting: Family Medicine

## 2021-07-08 ENCOUNTER — Ambulatory Visit: Payer: BC Managed Care – PPO | Admitting: Family Medicine

## 2021-07-08 VITALS — BP 114/78 | HR 79 | Temp 98.2°F | Wt 142.4 lb

## 2021-07-08 DIAGNOSIS — E782 Mixed hyperlipidemia: Secondary | ICD-10-CM | POA: Diagnosis not present

## 2021-07-08 NOTE — Progress Notes (Signed)
Subjective:    Patient ID: Meredith Lee, female    DOB: [birth year], 45 y.o.   MRN: [id number]  Chief Complaint  Patient presents with   Hyperlipidemia    Tooo high from last labs drawn, possible medication if no other alternatives    HPI Patient was seen today for follow-up on cholesterol.  Patient noted to have elevated cholesterol at OB/GYN visit.  Pt was fasting during that visit.  Advised to follow-up with PCP.  Pt eating mostly a vegetarian/plan-based diet but recently re-incorporated chicken.  Eating at least 3 eggs per day for protein.  Not really eating cheese or fried foods.  Eating salmon at least once a week.  Past Medical History:  Diagnosis Date   Amenorrhea    Depression    Family history of breast cancer    Family history of colon cancer    Fibroid    Heart murmur    as a child   Menstrual cramps    Ovarian cyst     Allergies  Allergen Reactions   Flagyl [Metronidazole] Anaphylaxis   Shellfish Allergy Diarrhea    ROS General: Denies fever, chills, night sweats, changes in weight, changes in appetite HEENT: Denies headaches, ear pain, changes in vision, rhinorrhea, sore throat CV: Denies CP, palpitations, SOB, orthopnea Pulm: Denies SOB, cough, wheezing GI: Denies abdominal pain, nausea, vomiting, diarrhea, constipation GU: Denies dysuria, hematuria, frequency, vaginal discharge Msk: Denies muscle cramps, joint pains Neuro: Denies weakness, numbness, tingling Skin: Denies rashes, bruising Psych: Denies depression, anxiety, hallucinations    Objective:    Blood pressure 114/78, pulse 79, temperature 98.2 F (36.8 C), temperature source Oral, weight 142 lb 6.4 oz (64.6 kg), last menstrual period 07/15/2019, SpO2 98 %.  Gen. Pleasant, well-nourished, in no distress, normal affect   HEENT: [place]/AT, face symmetric, conjunctiva clear, no scleral icterus, PERRLA, EOMI, nares patent without drainage Lungs: no accessory muscle use Cardiovascular: RRR, no  peripheral edema Musculoskeletal: No deformities, no cyanosis or clubbing, normal tone Neuro:  A&Ox3, CN II-XII intact, normal gait Skin:  Warm, dry, intact, tattoos on Ues and chest, no lesions/ rash   Wt Readings from Last 3 Encounters:  07/08/21 142 lb 6.4 oz (64.6 kg)  06/24/21 142 lb (64.4 kg)  09/10/20 137 lb (62.1 kg)    Lab Results  Component Value Date   WBC 3.0 (L) 07/01/2021   HGB 14.1 07/01/2021   HCT 41.7 07/01/2021   PLT 250 07/01/2021   GLUCOSE 88 07/01/2021   CHOL 275 (H) 07/01/2021   TRIG 118 07/01/2021   HDL 68 07/01/2021   LDLCALC 182 (H) 07/01/2021   ALT 17 07/01/2021   AST 16 07/01/2021   NA 138 07/01/2021   K 4.1 07/01/2021   CL 103 07/01/2021   CREATININE 0.59 07/01/2021   BUN 9 07/01/2021   CO2 26 07/01/2021   TSH 1.25 07/01/2021   HGBA1C 4.9 07/01/2021    Assessment/Plan:  Mixed hyperlipidemia -Total cholesterol 275, LDL 182, triglycerides 118, HDL 68 on 07/01/2021 OB/GYN -Per CV risk calculator  current 10-year risk of CVD 0.34% and lifetime risk of CVD is 39%.  Pt is no currently in a statin benefit group due to 10 yr risk <10% -consider fish oil gelatin free capsules -Discussed lifestyle modification including eating a heart healthy diet, regular aerobic exercise, maintaining body weight, and avoiding tobacco products. -Discussed decreasing intake of eggs/using egg whites and using other sources of protein including protein powder, setan, tofu -We will recheck fasting lipid  panel in 6 months.  F/u in 6 months for lab recheck, sooner if needed.  Grier Mitts, MD

## 2021-07-14 ENCOUNTER — Other Ambulatory Visit: Payer: Self-pay | Admitting: *Deleted

## 2021-07-14 DIAGNOSIS — D709 Neutropenia, unspecified: Secondary | ICD-10-CM

## 2021-07-14 LAB — TESTOS,TOTAL,FREE AND SHBG (FEMALE)
Sex Hormone Binding: 98 nmol/L (ref 17–124)
Testosterone, Total, LC-MS-MS: 26 ng/dL (ref 2–45)

## 2021-07-14 NOTE — Progress Notes (Signed)
Patient Care Team: Billie Ruddy, MD as PCP - General (Family Medicine)  DIAGNOSIS:    ICD-10-CM   1. Neutropenia, unspecified type (Meredith Lee)  D70.9        CHIEF COMPLIANT: Follow-up of neutropenia  INTERVAL HISTORY: Meredith Lee is a 45 y.o. with above-mentioned history of neutropenia. She presents to the clinic today for follow-up.  She reports no new health issues or concerns.  She has been suffering from a frozen shoulder and is getting some therapy for that.  She has had chronic abdominal issues which are unchanged from before.  On 07/01/2021, she had blood work that showed a white count of 3.0 and she was referred back to see [place].  Today her white count is 3.7 and the rest of the CBC is normal.  ALLERGIES:  is allergic to flagyl [metronidazole] and shellfish allergy.  MEDICATIONS:  Current Outpatient Medications  Medication Sig Dispense Refill   cyclobenzaprine (FLEXERIL) 5 MG tablet Take 1 tablet (5 mg total) by mouth at bedtime as needed for muscle spasms. 30 tablet 0   MAGNESIUM PO Take 500 mg by mouth daily.      Prenatal Vit-Fe Fumarate-FA (PRENATAL MULTIVITAMIN) TABS tablet Take 1 tablet by mouth daily at 12 noon.     VITAMIN D PO Take 5,000 Units by mouth daily.      No current facility-administered medications for this visit.    PHYSICAL EXAMINATION: ECOG PERFORMANCE STATUS: 1 - Symptomatic but completely ambulatory  Vitals:   07/15/21 0808  BP: (!) 101/55  Pulse: 72  Resp: 18  Temp: 97.8 F (36.6 C)  SpO2: 100%   Filed Weights   07/15/21 0808  Weight: 143 lb 9.6 oz (65.1 kg)    LABORATORY DATA:  I have reviewed the data as listed CMP Latest Ref Rng & Units 07/01/2021 04/23/2020 08/16/2019  Glucose 65 - 99 mg/dL 88 82 106(H)  BUN 7 - 25 mg/dL '9 7 8  ' Creatinine 0.50 - 0.99 mg/dL 0.59 0.52 0.53  Sodium 135 - 146 mmol/L 138 139 140  Potassium 3.5 - 5.3 mmol/L 4.1 4.3 4.8  Chloride 98 - 110 mmol/L 103 102 107  CO2 20 - 32 mmol/L '26 31 25  ' Calcium  8.6 - 10.2 mg/dL 10.0 9.4 9.2  Total Protein 6.1 - 8.1 g/dL 7.7 7.0 -  Total Bilirubin 0.2 - 1.2 mg/dL 0.4 0.4 -  Alkaline Phos 39 - 117 U/L - 88 -  AST 10 - 30 U/L 16 28 -  ALT 6 - 29 U/L 17 30 -    Lab Results  Component Value Date   WBC 3.7 (L) 07/15/2021   HGB 12.8 07/15/2021   HCT 38.0 07/15/2021   MCV 93.4 07/15/2021   PLT 240 07/15/2021   NEUTROABS 1.4 (L) 07/15/2021    ASSESSMENT & PLAN:  Neutropenia (HCC) Leucopenia especially Neutropenia:  Lab review:    07/12/18: WBC 3.6 08/20/19: WBC 7.5 04/23/20: WBC 2, ANC 0.8 05/08/20: WBC 3.3, ANC 1 07/01/2021: WBC 3, hemoglobin 14.1 07/15/2021: WBC 3.7, ANC 1.4, hemoglobin 12.8  Previous Workup: ANA: Positive1: 320 speckled pattern I informed her that having a positive ANA does not automatically mean that she has lupus.  If she develops any other symptoms of lupus then she needs to see a rheumatologist.  I discussed with her the symptoms of lupus.  Selenium zinc and copper levels: Normal  Hepatitis B, hepatitis C and HIV: Normal   If her neutrophil count drops below 0.8, then  we will consider doing a bone marrow biopsy. Her white blood cell count will continue to fluctuate and we are looking to see if there is a trend.  So far she has not had any frequent infections and her blood counts have remained stable Therefore I recommend continued watchful monitoring.   Return to clinic on an as-needed basis.    No orders of the defined types were placed in this encounter.  The patient has a good understanding of the overall plan. she agrees with it. she will call with any problems that may develop before the next visit here.  Total time spent: 20 mins including face to face time and time spent for planning, charting and coordination of care  Rulon Eisenmenger, MD, MPH 07/15/2021  I, Thana Ates, am acting as scribe for Dr. Nicholas Lose.  I have reviewed the above documentation for accuracy and completeness, and I agree with the  above.

## 2021-07-15 ENCOUNTER — Inpatient Hospital Stay: Payer: BC Managed Care – PPO | Admitting: Hematology and Oncology

## 2021-07-15 ENCOUNTER — Other Ambulatory Visit: Payer: Self-pay

## 2021-07-15 ENCOUNTER — Inpatient Hospital Stay: Payer: BC Managed Care – PPO | Attending: Hematology and Oncology

## 2021-07-15 DIAGNOSIS — R6882 Decreased libido: Secondary | ICD-10-CM

## 2021-07-15 DIAGNOSIS — Z79899 Other long term (current) drug therapy: Secondary | ICD-10-CM | POA: Insufficient documentation

## 2021-07-15 DIAGNOSIS — D709 Neutropenia, unspecified: Secondary | ICD-10-CM | POA: Diagnosis not present

## 2021-07-15 LAB — CBC WITH DIFFERENTIAL (CANCER CENTER ONLY)
Abs Immature Granulocytes: 0.01 10*3/uL (ref 0.00–0.07)
Basophils Absolute: 0 10*3/uL (ref 0.0–0.1)
Basophils Relative: 0 %
Eosinophils Absolute: 0 10*3/uL (ref 0.0–0.5)
Eosinophils Relative: 1 %
HCT: 38 % (ref 36.0–46.0)
Hemoglobin: 12.8 g/dL (ref 12.0–15.0)
Immature Granulocytes: 0 %
Lymphocytes Relative: 48 %
Lymphs Abs: 1.7 10*3/uL (ref 0.7–4.0)
MCH: 31.4 pg (ref 26.0–34.0)
MCHC: 33.7 g/dL (ref 30.0–36.0)
MCV: 93.4 fL (ref 80.0–100.0)
Monocytes Absolute: 0.4 10*3/uL (ref 0.1–1.0)
Monocytes Relative: 12 %
Neutro Abs: 1.4 10*3/uL — ABNORMAL LOW (ref 1.7–7.7)
Neutrophils Relative %: 39 %
Platelet Count: 240 10*3/uL (ref 150–400)
RBC: 4.07 MIL/uL (ref 3.87–5.11)
RDW: 11.4 % — ABNORMAL LOW (ref 11.5–15.5)
WBC Count: 3.7 10*3/uL — ABNORMAL LOW (ref 4.0–10.5)
nRBC: 0 % (ref 0.0–0.2)

## 2021-07-15 NOTE — Assessment & Plan Note (Signed)
Leucopenia especially Neutropenia:  Lab review:   07/12/18: WBC 3.6 08/20/19: WBC 7.5 04/23/20: WBC 2, ANC 0.8 05/08/20: WBC 3.3, ANC 1 07/01/2021: WBC 3, hemoglobin 14.1  Previous Workup: ANA: Positive1: 320 speckled pattern I informed her that having a positive ANA does not automatically mean that she has lupus.  If she develops any other symptoms of lupus then she needs to see a rheumatologist.  I discussed with her the symptoms of lupus.  Selenium zinc and copper levels: Normal  Hepatitis B, hepatitis C and HIV: Normal  If her neutrophil count drops below 0.5 then we will consider doing a bone marrow biopsy. No significant change in her white blood cell count although ANC was not available on the most recent blood work. I recommend continued watchful monitoring.   Return to clinic in 1 year for follow-up

## 2021-07-16 NOTE — Telephone Encounter (Signed)
Left message for patient to call.

## 2021-07-20 NOTE — Telephone Encounter (Signed)
Patient given # to call and schedule the below note.

## 2021-07-22 ENCOUNTER — Other Ambulatory Visit: Payer: Self-pay

## 2021-07-22 ENCOUNTER — Other Ambulatory Visit: Payer: BC Managed Care – PPO

## 2021-07-22 DIAGNOSIS — R6882 Decreased libido: Secondary | ICD-10-CM

## 2021-07-22 NOTE — Telephone Encounter (Signed)
PA obtained.  Encounter closed.

## 2021-07-22 NOTE — Telephone Encounter (Signed)
Mri scheduled on 08/01/21, will route to [facility] to check for prior approval

## 2021-07-23 ENCOUNTER — Other Ambulatory Visit: Payer: BC Managed Care – PPO

## 2021-07-23 ENCOUNTER — Ambulatory Visit: Payer: BC Managed Care – PPO | Admitting: Hematology and Oncology

## 2021-07-27 LAB — TESTOSTERONE, FREE: TESTOSTERONE FREE: 1.5 pg/mL (ref 0.2–5.0)

## 2021-08-01 ENCOUNTER — Other Ambulatory Visit: Payer: Self-pay

## 2021-08-01 ENCOUNTER — Ambulatory Visit
Admission: RE | Admit: 2021-08-01 | Discharge: 2021-08-01 | Disposition: A | Discharge status: Home / Self Care | Payer: BC Managed Care – PPO | Source: Ambulatory Visit | Attending: Obstetrics and Gynecology | Admitting: Obstetrics and Gynecology

## 2021-08-01 DIAGNOSIS — Z9189 Other specified personal risk factors, not elsewhere classified: Secondary | ICD-10-CM

## 2021-08-01 IMAGING — MR MR BREAST BILAT WO/W CM
8 of 12 series · 33 of 48 positions shown · IV contrast (7ML GADAVIST)
Comparison: Previous exam(s).

CLINICAL DATA: High risk screening.

Increased risk of breast CA, 23%; family hx both grandmothers age 42
and age 60, aunt 60's; no hx of breast procedures/surgeries/bx; no
prior MR; no current breast complaints; genetic testing negative
LABS:  No labs drawn at time of imaging.
EXAM:
BILATERAL BREAST MRI WITH AND WITHOUT CONTRAST
TECHNIQUE: Multiplanar, multisequence MR images of both breasts were obtained
prior to and following the intravenous administration of 7 ml of
Gadavist

[Series 2: t2_tirm_tra ipat (a-p) · axial · 3.0mm · 0.70mm/px · 1 of 52 slices shown]
[im 1/52]
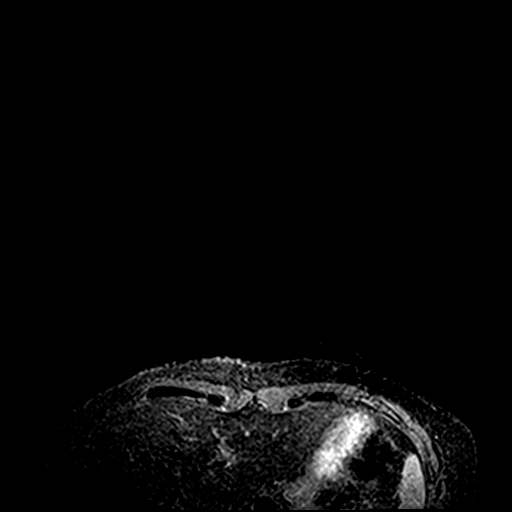

[Series 3: fl3d pre-cm no · axial · non-contrast · 1.2mm · 0.94mm/px · z∈[-66,+105]mm · 5 of 144 slices shown]
[im 1/144]
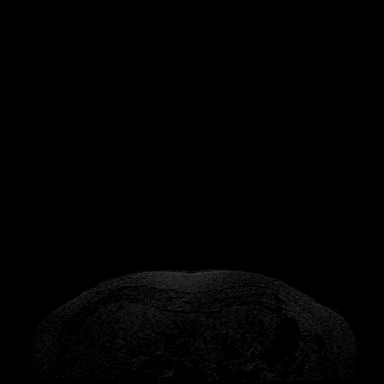
[im 36/144]
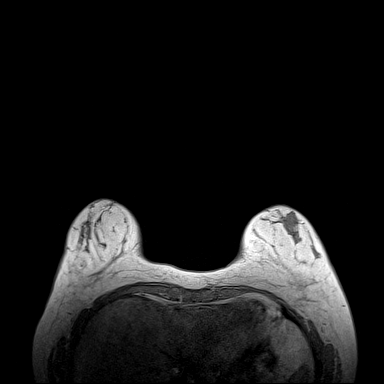
[im 72/144]
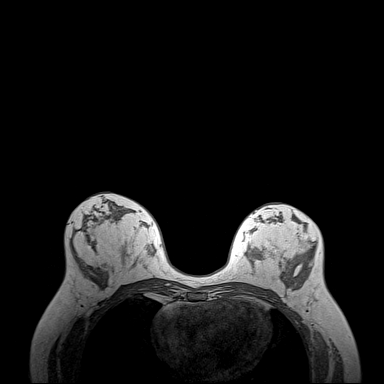
[im 108/144]
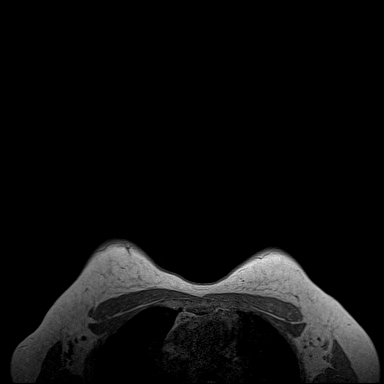
[im 144/144]
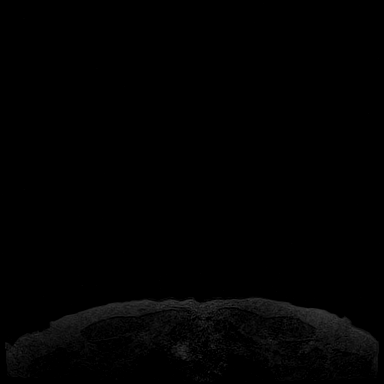

[Series 4: fl3d pre-cm · axial · non-contrast · 1.2mm · 0.94mm/px · z∈[-66,+105]mm · 5 of 144 slices shown]
[im 1/144]
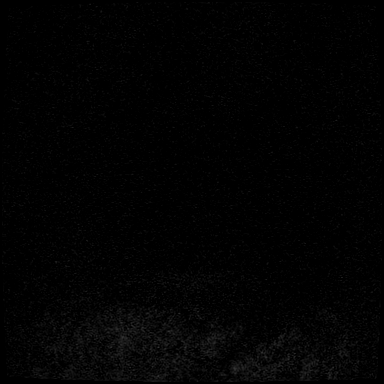
[im 36/144]
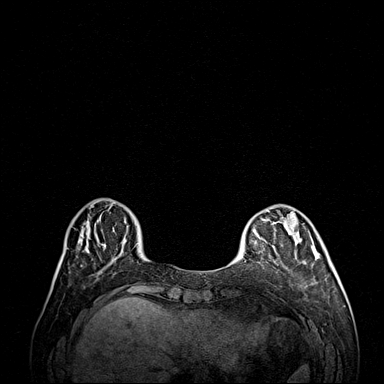
[im 72/144]
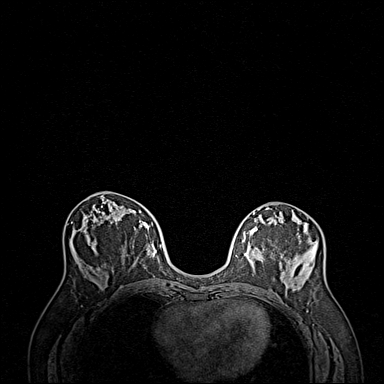
[im 108/144]
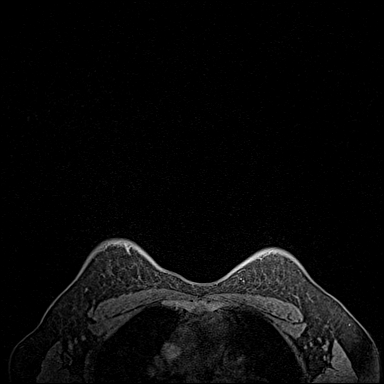
[im 144/144]
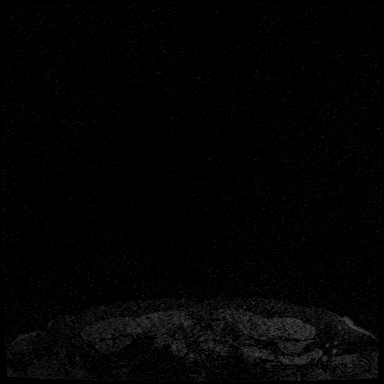

[Series 5: fl3d post immediate · axial · 1.2mm · 0.94mm/px · z∈[-66,+105]mm · 5 of 144 slices shown (1 of 3)]
[im 1/144]
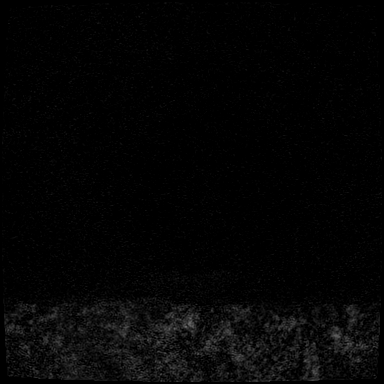
[im 36/144]
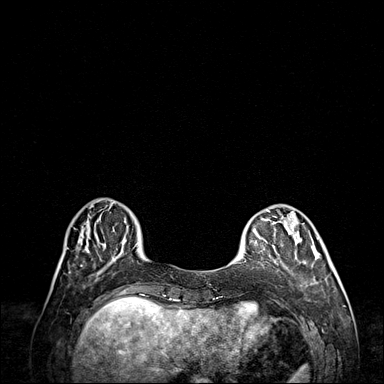
[im 72/144]
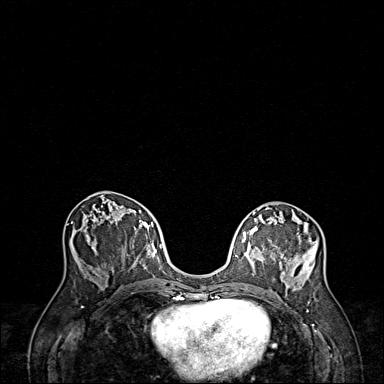
[im 108/144]
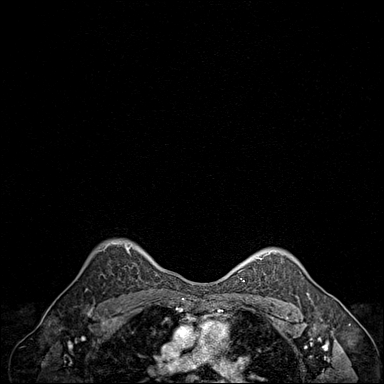
[im 144/144]
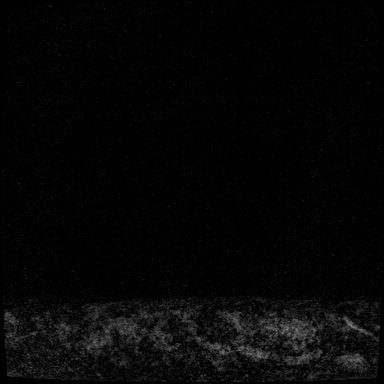

[Series 6: fl3d post immediate · axial · 1.2mm · 0.94mm/px · z∈[-66,+105]mm · 5 of 144 slices shown (2 of 3)]
[im 1/144]
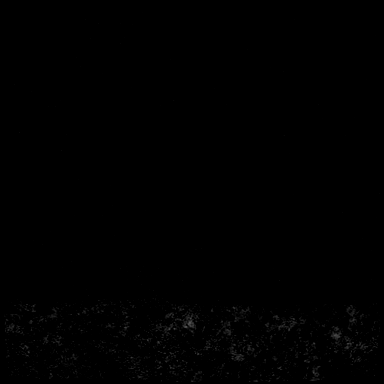
[im 36/144]
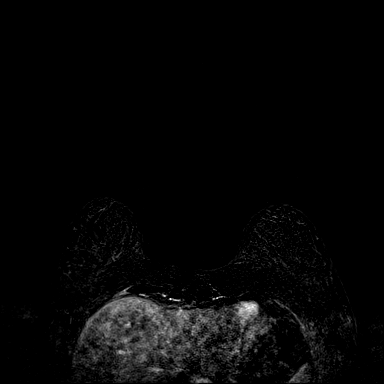
[im 72/144]
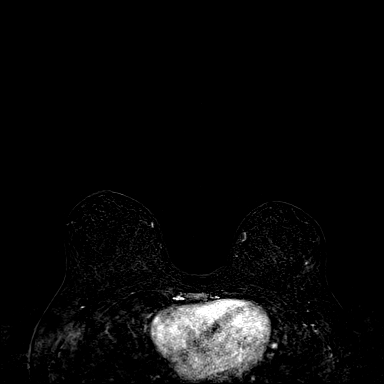
[im 108/144]
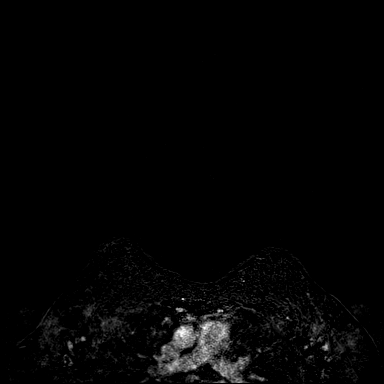
[im 144/144]
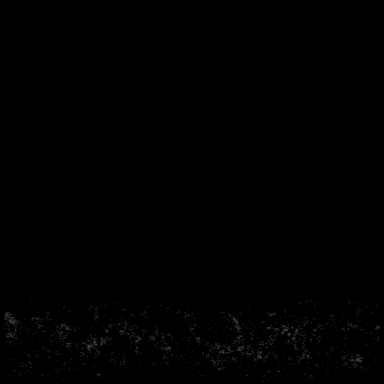

[Series 7: fl3d post immediate · axial · 172.8mm · 0.94mm/px · 1 of 1 slices shown (3 of 3)]
[im 1/1]
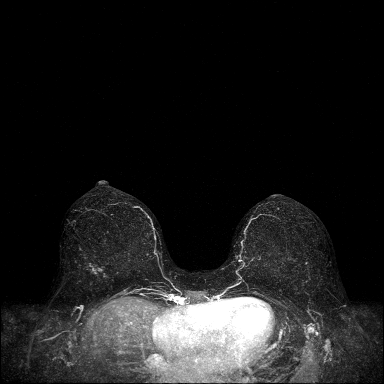

[Series 8: fl3d post 3min · axial · 1.2mm · 0.94mm/px · z∈[-66,+105]mm · 6 of 144 slices shown]
[im 1/144]
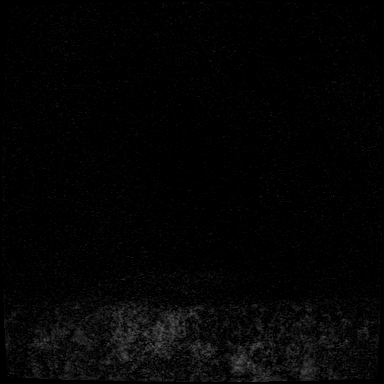
[im 29/144]
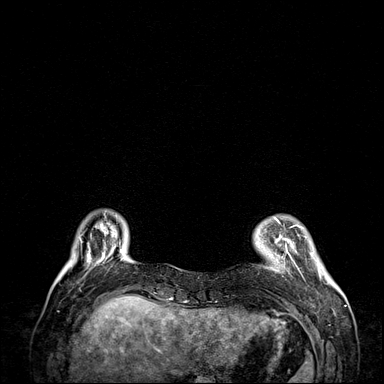
[im 58/144]
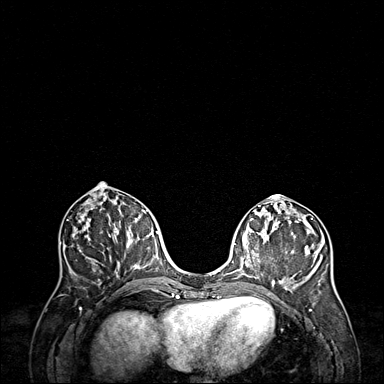
[im 86/144]
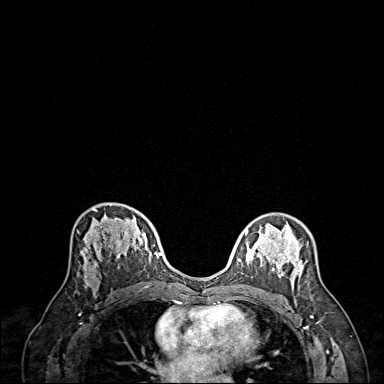
[im 115/144]
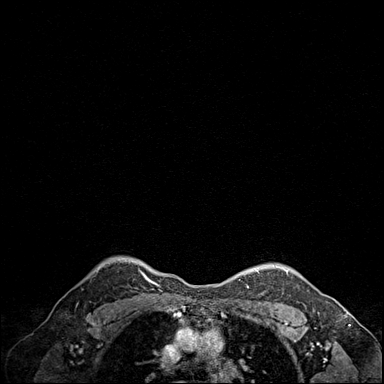
[im 144/144]
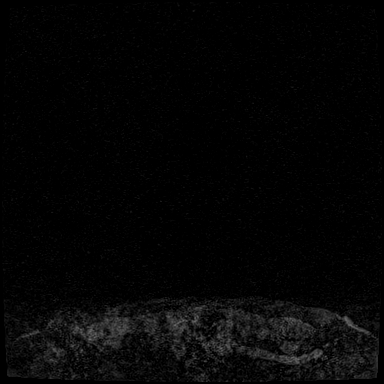

[Series 9: fl3d post 3min_sub · axial · 1.2mm · 0.94mm/px · z∈[-66,+70]mm · 5 of 144 slices shown]
[im 1/144]
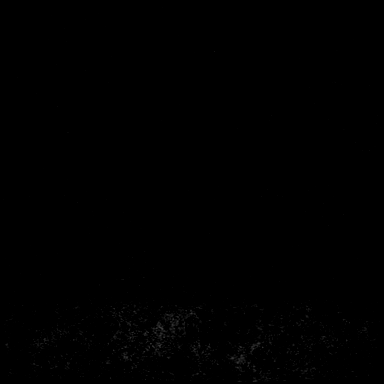
[im 29/144]
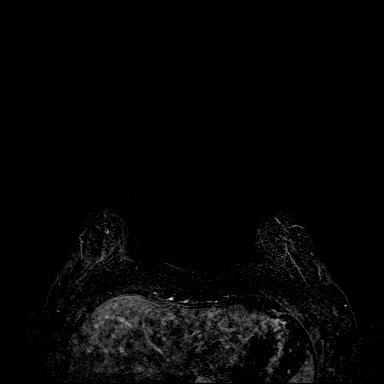
[im 58/144]
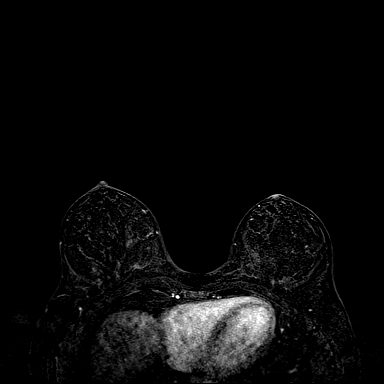
[im 86/144]
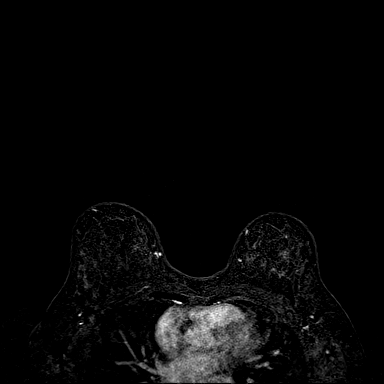
[im 115/144]
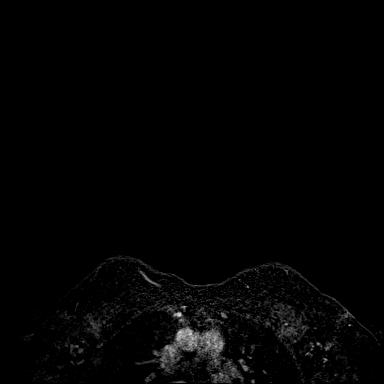

[33 of 48 positions shown; findings below may reference images not displayed]

Three-dimensional MR images were rendered by post-processing of the
original MR data on an independent workstation. The
three-dimensional MR images were interpreted, and findings are
reported in the following complete MRI report for this study. Three
dimensional images were evaluated at the independent interpreting
workstation using the DynaCAD thin client.
FINDINGS: Breast composition: c. Heterogeneous fibroglandular tissue.

Background parenchymal enhancement: Mild

Right breast: There is a focal area of non mass enhancement in the
posterior, upper outer right breast, centered on image 80, series 9,
measuring 2.0 x 1.3 x 1.4 cm, showing mixed kinetics, but some areas
of washout.

There are no other areas of abnormal right breast enhancement. No
right breast masses.

Left breast: No mass or abnormal enhancement.

Lymph nodes: No abnormal appearing lymph nodes.

Ancillary findings:  None.
IMPRESSION: 1. Suspicious, 2 cm, area of non mass enhancement in the posterior
upper outer right breast. Tissue sampling is recommended.

RECOMMENDATION:
1. MRI guided core needle biopsy of the focal area of non mass
enhancement in the posterior, upper outer right breast, image 80,
series 9.

BI-RADS CATEGORY  4: Suspicious.

## 2021-08-01 MED ORDER — GADOBUTROL 1 MMOL/ML IV SOLN
7.0000 mL | Freq: Once | INTRAVENOUS | Status: AC | PRN
  Administered 2021-08-01: 7 mL via INTRAVENOUS

## 2021-08-02 ENCOUNTER — Other Ambulatory Visit: Payer: Self-pay

## 2021-08-02 DIAGNOSIS — R928 Other abnormal and inconclusive findings on diagnostic imaging of breast: Secondary | ICD-10-CM

## 2021-08-03 ENCOUNTER — Telehealth: Payer: Self-pay

## 2021-08-03 ENCOUNTER — Other Ambulatory Visit: Payer: Self-pay | Admitting: Obstetrics and Gynecology

## 2021-08-03 ENCOUNTER — Other Ambulatory Visit: Payer: Self-pay

## 2021-08-03 DIAGNOSIS — R928 Other abnormal and inconclusive findings on diagnostic imaging of breast: Secondary | ICD-10-CM

## 2021-08-03 MED ORDER — NONFORMULARY OR COMPOUNDED ITEM: 0 refills | Status: DC

## 2021-08-03 NOTE — Telephone Encounter (Signed)
MR guided breast biopsy scheduled for 08/09/21 at arrival time 8:10 am for 8:50am appt. At [place]. I called patient and spoke with her and informed her.

## 2021-08-03 NOTE — Telephone Encounter (Signed)
Breast MRI shows an area of concern. She needs to be set up for a MRI guided biopsy, please make sure that is being scheduled. I left the patient a message (okay per DPR) that she would need a biopsy and someone would reach out to her.

## 2021-08-09 ENCOUNTER — Other Ambulatory Visit (HOSPITAL_COMMUNITY): Payer: Self-pay | Admitting: Diagnostic Radiology

## 2021-08-09 ENCOUNTER — Other Ambulatory Visit: Payer: Self-pay

## 2021-08-09 ENCOUNTER — Ambulatory Visit
Admission: RE | Admit: 2021-08-09 | Discharge: 2021-08-09 | Disposition: A | Discharge status: Home / Self Care | Payer: BC Managed Care – PPO | Source: Ambulatory Visit | Attending: Obstetrics and Gynecology | Admitting: Obstetrics and Gynecology

## 2021-08-09 DIAGNOSIS — R928 Other abnormal and inconclusive findings on diagnostic imaging of breast: Secondary | ICD-10-CM

## 2021-08-09 HISTORY — PX: BREAST BIOPSY: SHX20

## 2021-08-09 IMAGING — MG MM BREAST LOCALIZATION CLIP
6 series · 6 of 18 positions shown · non-contrast
Comparison: Previous exam(s).

CLINICAL DATA: Status post MR guided core biopsy of RIGHT breast.

EXAM:
3D DIAGNOSTIC RIGHT MAMMOGRAM POST MRI BIOPSY

[R ML synth-2D]
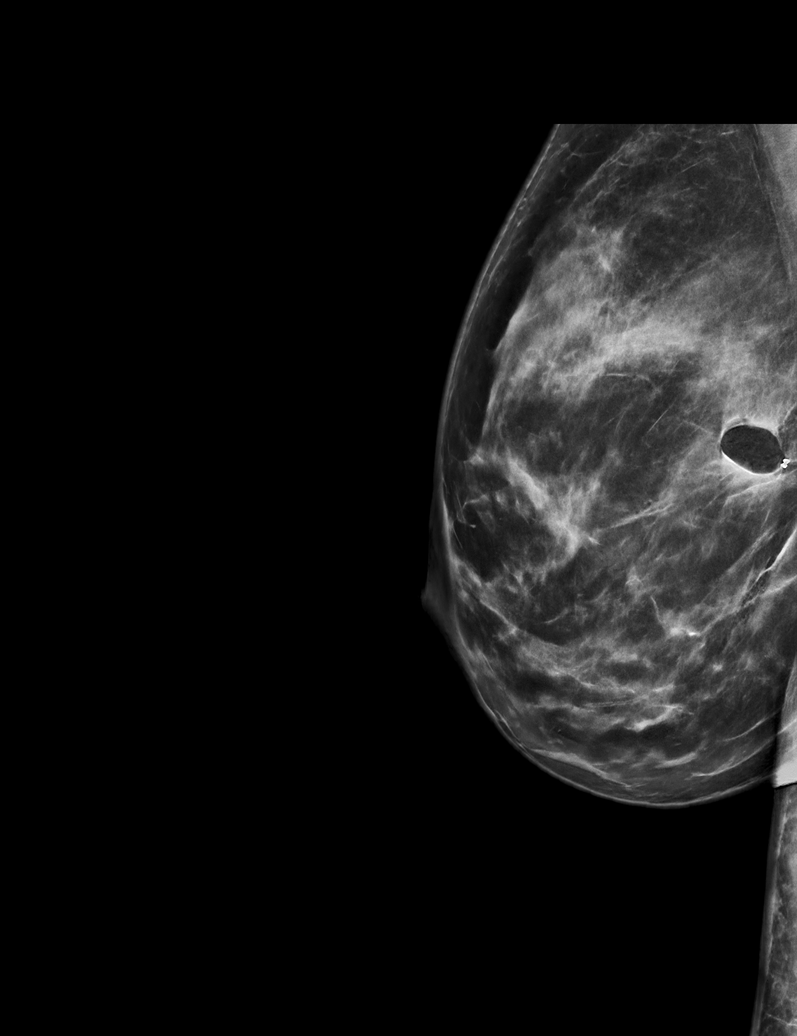

[R CC synth-2D]
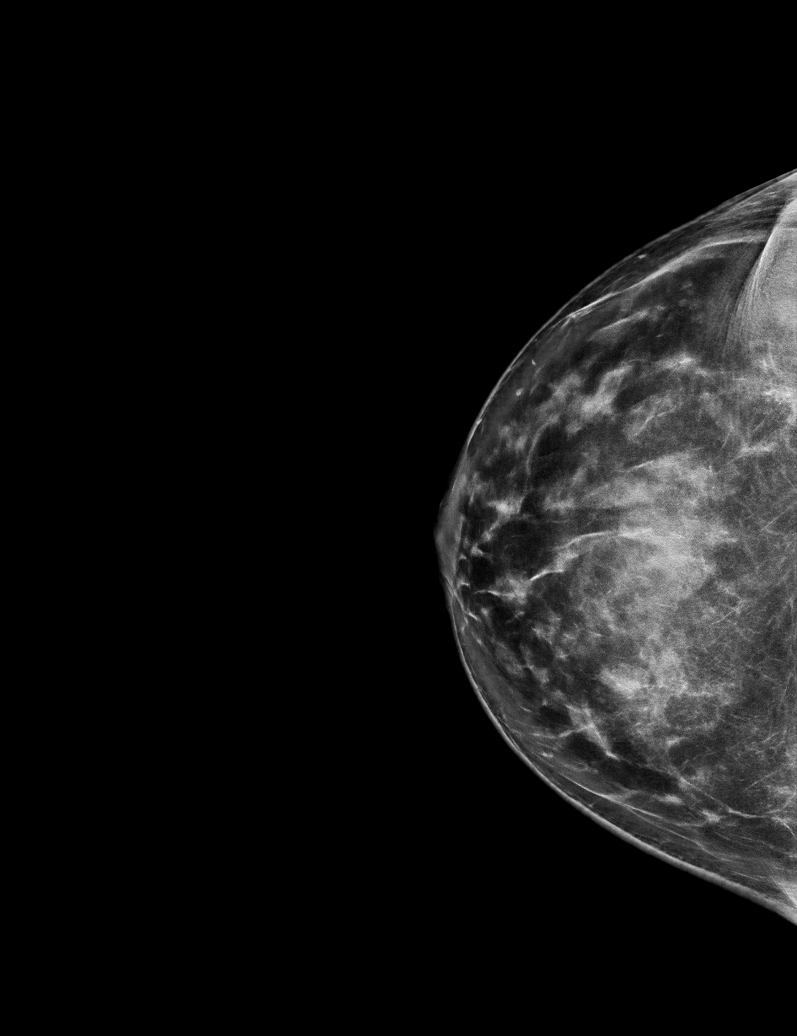

[R XCCL synth-2D]
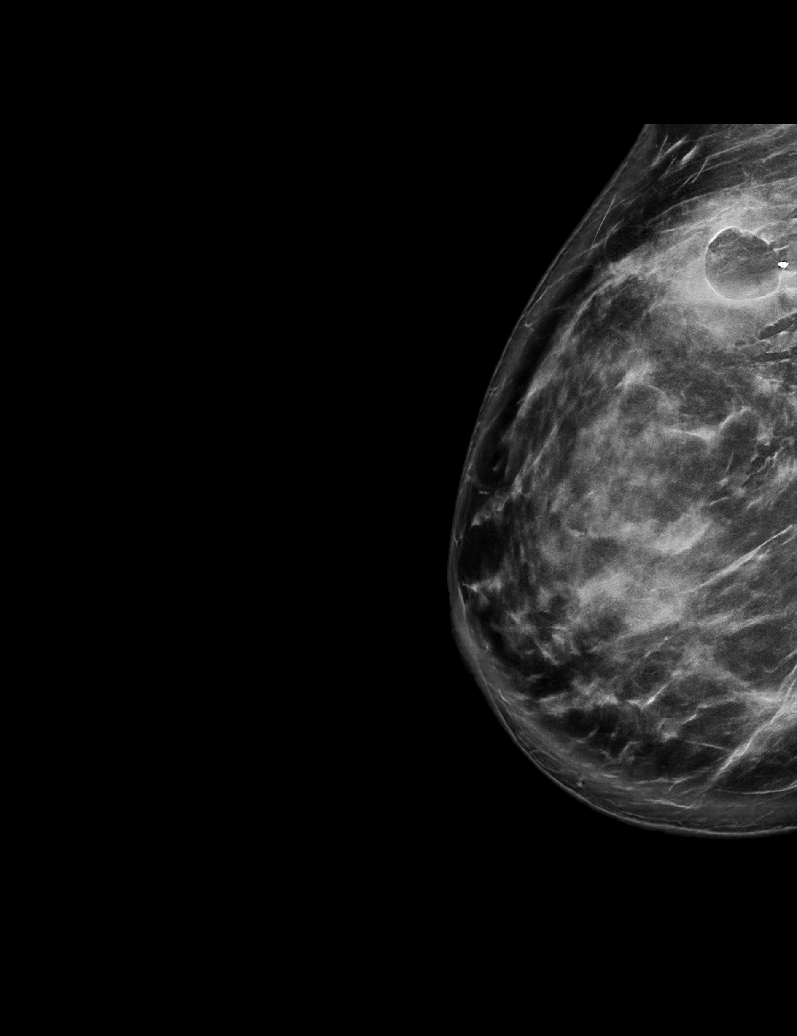

[R ML tomo · tomo slice 41/80.0]
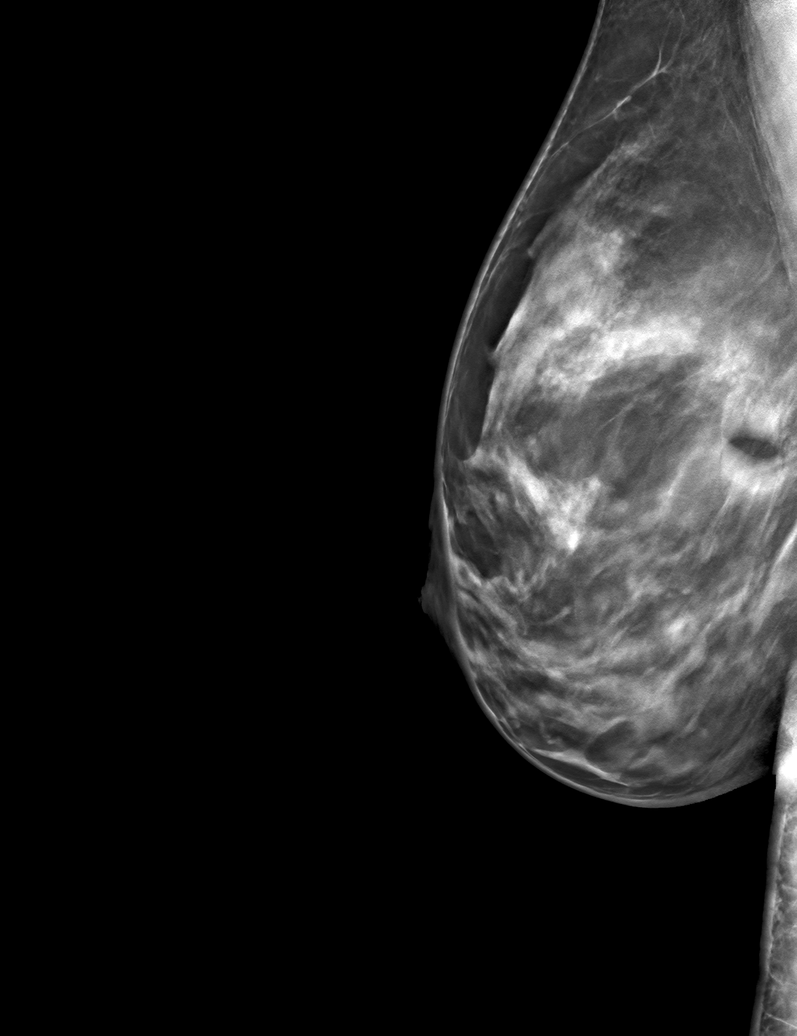

[R CC tomo · tomo slice 45/89.0]
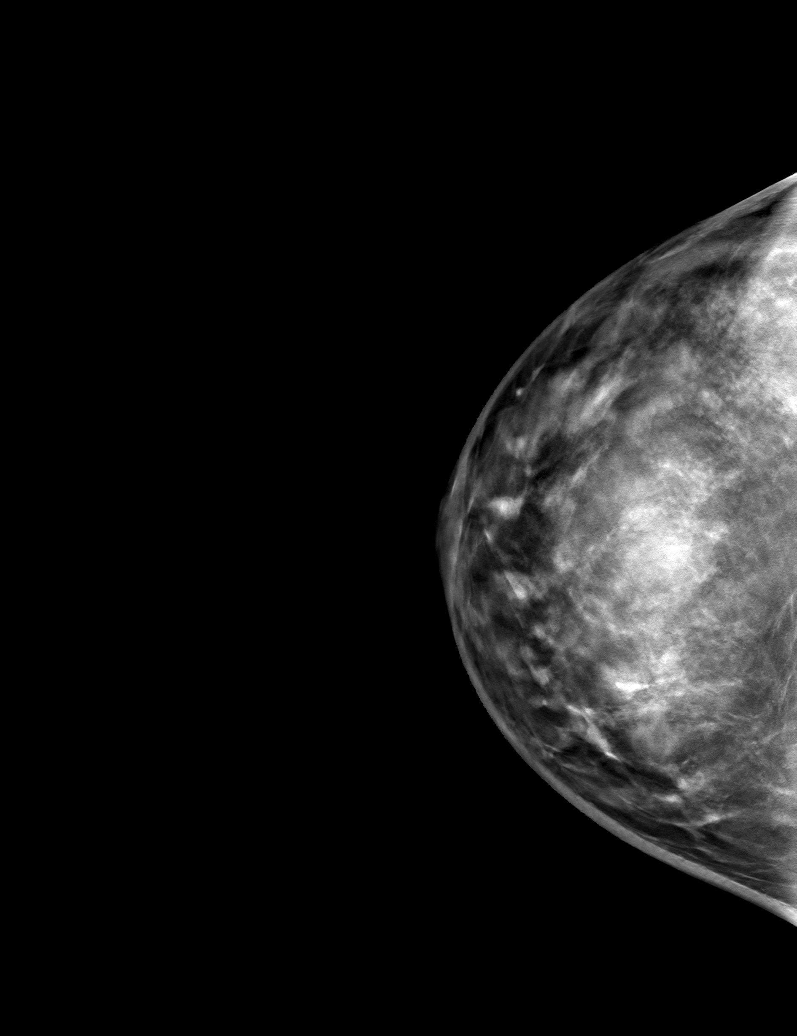

[R XCCL tomo · tomo slice 42/83.0]
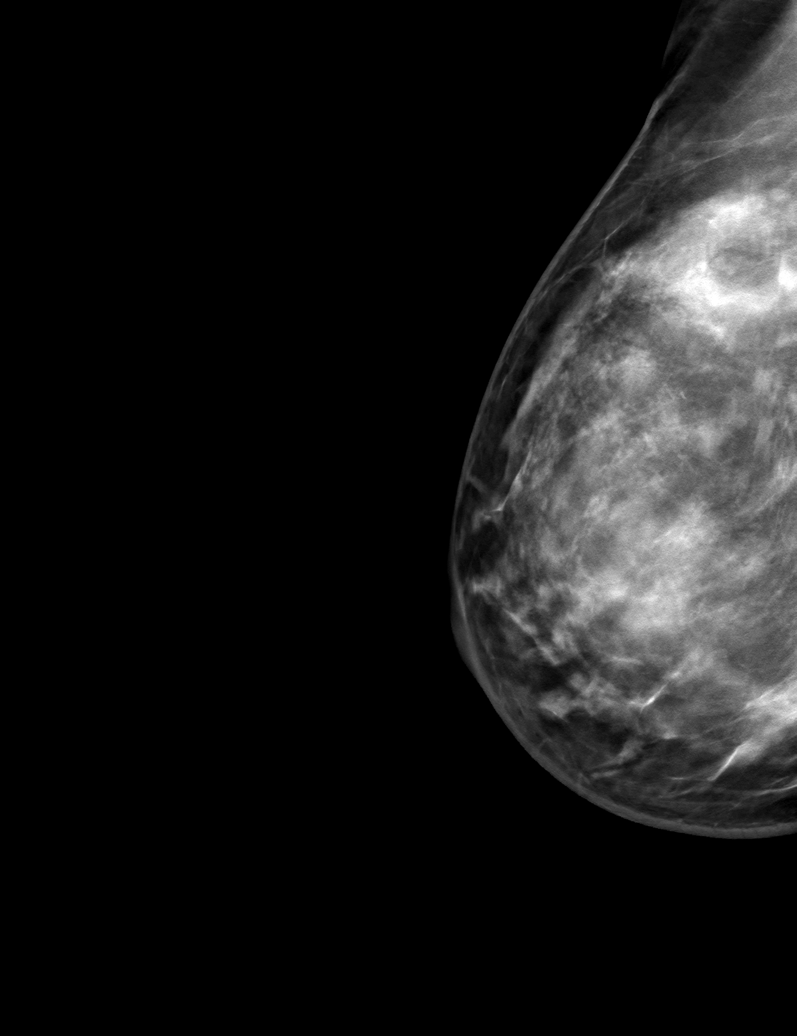

[6 of 18 positions shown; findings below may reference images not displayed]

FINDINGS: 3D Mammographic images were obtained following MRI guided biopsy of
non mass enhancement in the posterior UPPER OUTER QUADRANT of the
RIGHT breast and placement of a barbell clip. The biopsy marking
clip is in expected position at the site of biopsy.
IMPRESSION: Appropriate positioning of the barbell shaped biopsy marking clip at
the site of biopsy in the UPPER-OUTER QUADRANT RIGHT breast.

Final Assessment: Post Procedure Mammograms for Marker Placement

## 2021-08-09 IMAGING — MR MR BREAST BX W/ LOC DEV 1ST LEASION IMAGE BX SPEC MR GUIDE*R*
6 of 8 series · 34 of 48 positions shown · IV contrast (7ml gadavist)
Comparison: Previous exams.
COMPARISON: Previous exams.

Addendum:
CLINICAL DATA: Patient presents for MR guided core biopsy of RIGHT
breast.

EXAM:
MRI GUIDED CORE NEEDLE BIOPSY OF THE RIGHT BREAST
TECHNIQUE: Multiplanar, multisequence MR imaging of the RIGHT breast was
performed both before and after administration of intravenous
contrast.
CONTRAST:  7 ml of Gadavist

[Series 3: fiducial unilateral · sagittal · 2.0mm · 1.33mm/px · 4 of 56 slices shown]
[im 1/56]
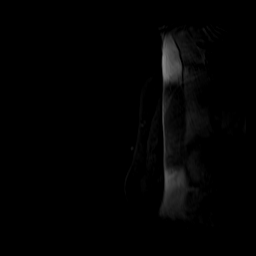
[im 19/56]
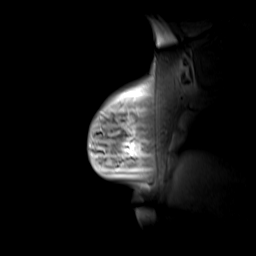
[im 37/56]
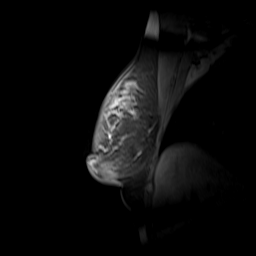
[im 56/56]
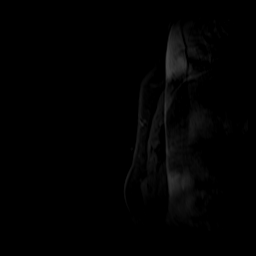

[Series 4: dynamic pre · axial · non-contrast · 1.3mm · 0.73mm/px · z∈[-92,+93]mm · 7 of 144 slices shown]
[im 1/144]
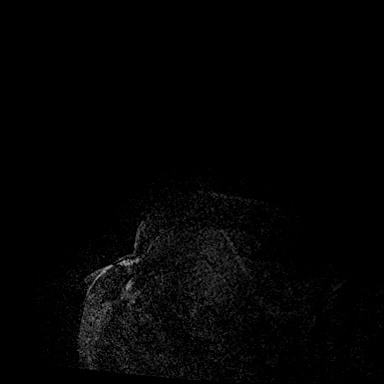
[im 24/144]
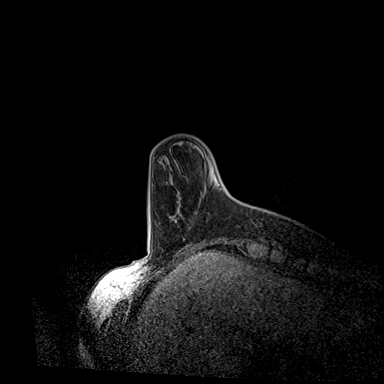
[im 48/144]
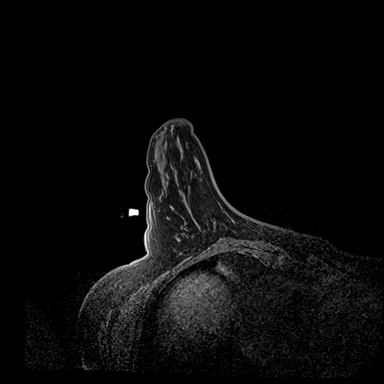
[im 72/144]
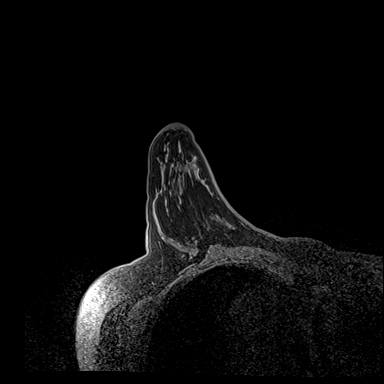
[im 96/144]
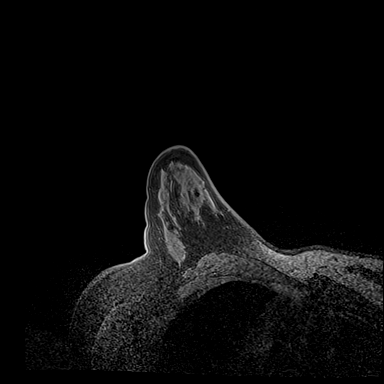
[im 120/144]
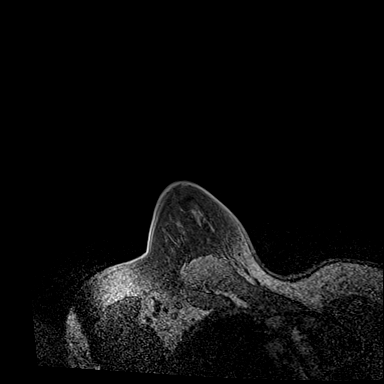
[im 144/144]
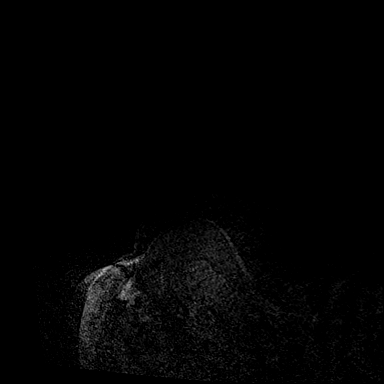

[Series 5: dynamic post 20 · axial · 1.3mm · 0.73mm/px · z∈[-92,+93]mm · 7 of 144 slices shown (1 of 2)]
[im 1/144]
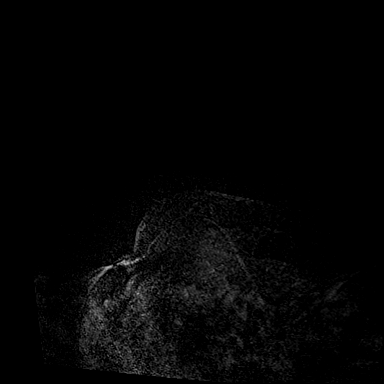
[im 24/144]
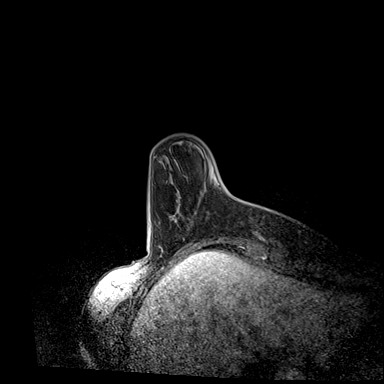
[im 48/144]
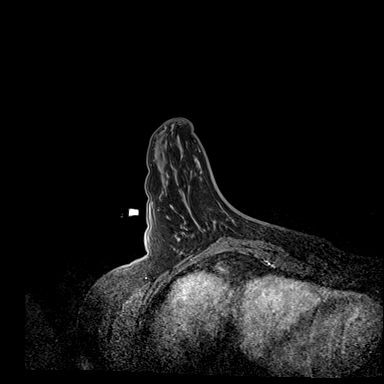
[im 72/144]
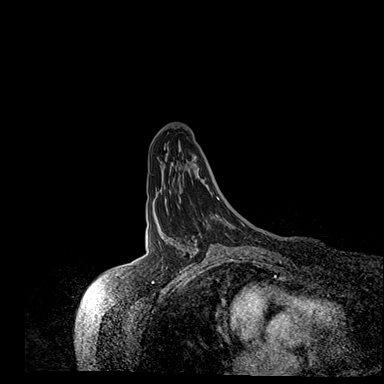
[im 96/144]
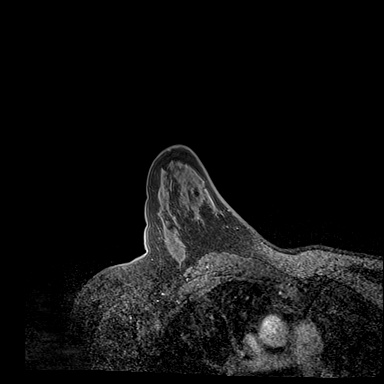
[im 120/144]
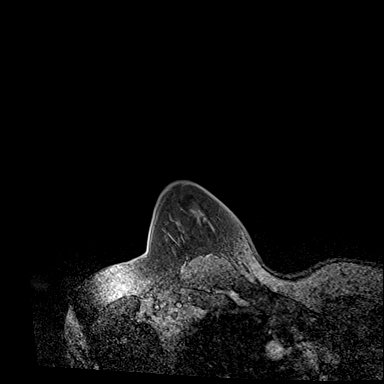
[im 144/144]
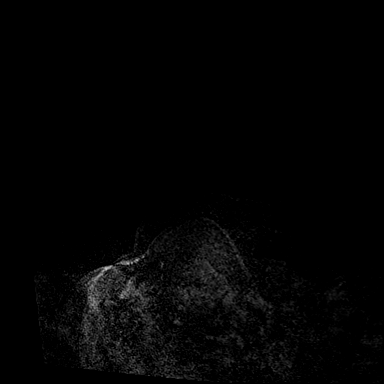

[Series 6: dynamic post 20 · axial · 1.3mm · 0.73mm/px · z∈[-92,+93]mm · 6 of 144 slices shown (2 of 2)]
[im 1/144]
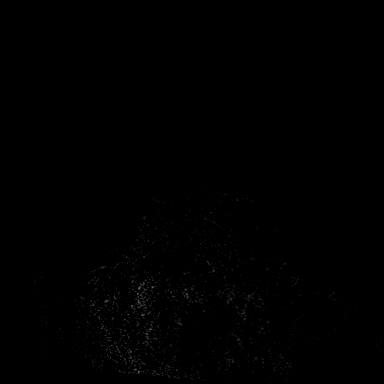
[im 29/144]
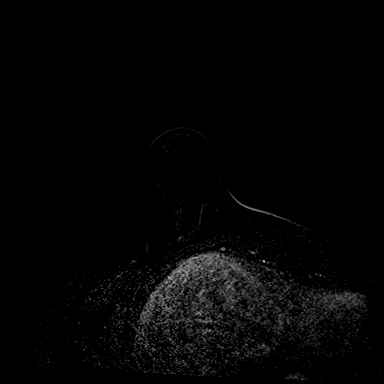
[im 58/144]
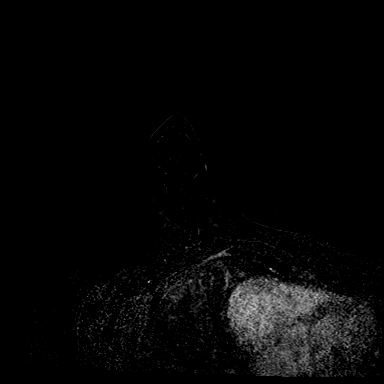
[im 86/144]
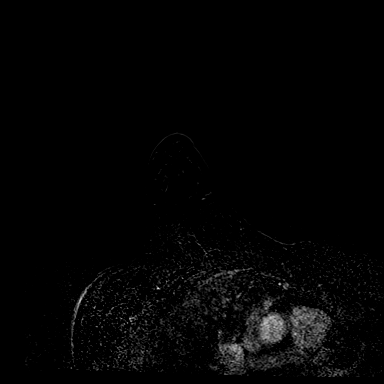
[im 115/144]
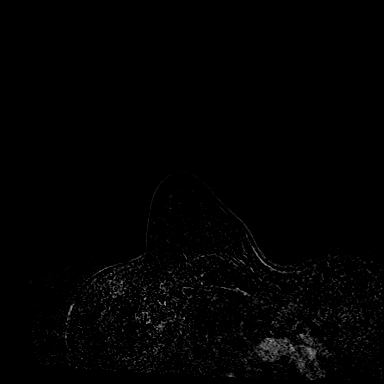
[im 144/144]
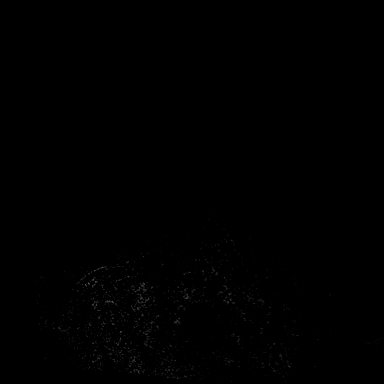

[Series 7: needle confirmation · axial · 1.3mm · 0.73mm/px · z∈[-92,+93]mm · 6 of 144 slices shown]
[im 1/144]
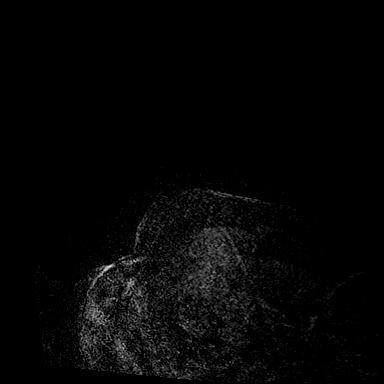
[im 29/144]
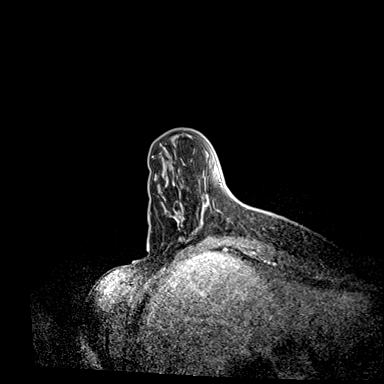
[im 58/144]
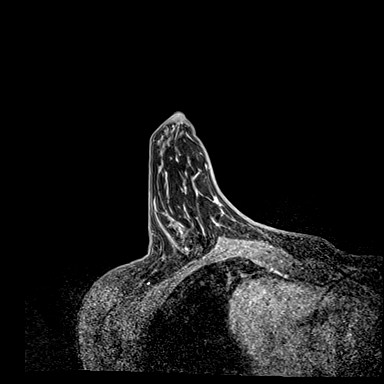
[im 86/144]
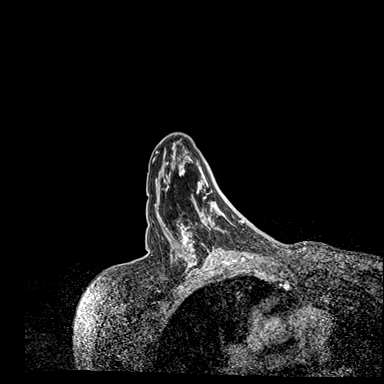
[im 115/144]
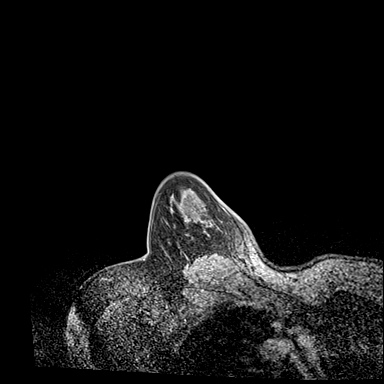
[im 144/144]
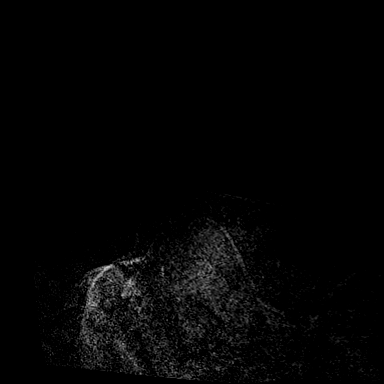

[Series 8: needle confirmation_sub · axial · 1.3mm · 0.73mm/px · z∈[-92,+18]mm · 4 of 144 slices shown]
[im 1/144]
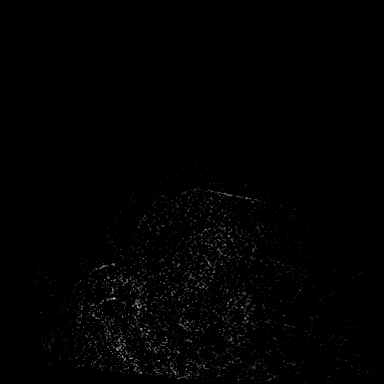
[im 29/144]
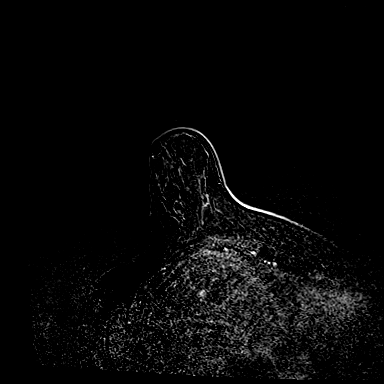
[im 58/144]
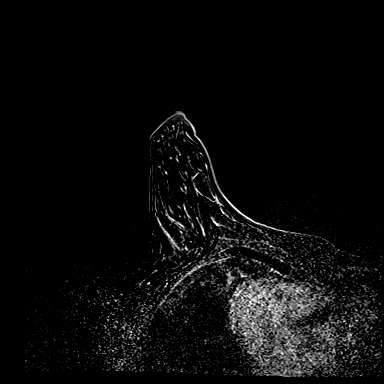
[im 86/144]
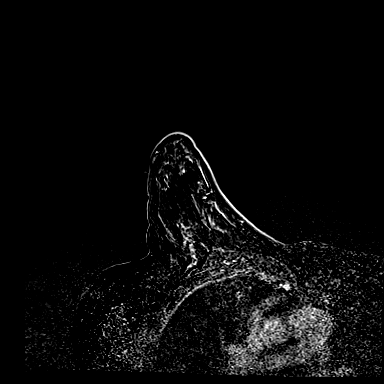

[34 of 48 positions shown; findings below may reference images not displayed]

FINDINGS: I met with the patient, and we discussed the procedure of MRI guided
biopsy, including risks, benefits, and alternatives. Specifically,
we discussed the risks of infection, bleeding, tissue injury, clip
migration, and inadequate sampling. Informed, written consent was
given. The usual time out protocol was performed immediately prior
to the procedure.

Using sterile technique, 1% Lidocaine, MRI guidance, and a 9 gauge
vacuum assisted device, biopsy was performed of focal non mass
enhancement in the posterior UPPER OUTER QUADRANT of the RIGHT
breast using a LATERAL to MEDIAL approach. At the conclusion of the
procedure, a barbell tissue marker clip was deployed into the biopsy
cavity. Follow-up 2-view mammogram was performed and dictated
separately.
IMPRESSION: MRI guided biopsy of RIGHT breast non mass enhancement. No apparent
complications.

ADDENDUM:
Pathology revealed BENIGN BREAST TISSUE WITH FOCAL ADENOSIS AND
FIBROCYSTIC CHANGES- NO MALIGNANCY IDENTIFIED of the RIGHT breast,
posterior upper outer quadrant, barbell clip. This was found to be
concordant by Dr. RTOYOTA.

Pathology results were discussed with the patient by telephone. The
patient reported doing well after the biopsy with tenderness at the
site. Post biopsy instructions and care were reviewed and questions
were answered. The patient was encouraged to call The [REDACTED]

Recommend bilateral breast MRI in 6 months per protocol. The patient
was instructed to return for annual screening mammography due
[DATE] and informed a reminder notice would be sent regarding
these appointments.

Pathology results reported by RTOYOTA RN on [DATE].

*** End of Addendum ***
FINDINGS: I met with the patient, and we discussed the procedure of MRI guided
biopsy, including risks, benefits, and alternatives. Specifically,
we discussed the risks of infection, bleeding, tissue injury, clip
migration, and inadequate sampling. Informed, written consent was
given. The usual time out protocol was performed immediately prior
to the procedure.

Using sterile technique, 1% Lidocaine, MRI guidance, and a 9 gauge
vacuum assisted device, biopsy was performed of focal non mass
enhancement in the posterior UPPER OUTER QUADRANT of the RIGHT
breast using a LATERAL to MEDIAL approach. At the conclusion of the
procedure, a barbell tissue marker clip was deployed into the biopsy
cavity. Follow-up 2-view mammogram was performed and dictated
separately.
IMPRESSION: MRI guided biopsy of RIGHT breast non mass enhancement. No apparent
complications.

## 2021-09-02 ENCOUNTER — Other Ambulatory Visit: Payer: BC Managed Care – PPO

## 2021-11-09 ENCOUNTER — Other Ambulatory Visit: Payer: Self-pay | Admitting: Obstetrics and Gynecology

## 2021-11-09 DIAGNOSIS — Z1231 Encounter for screening mammogram for malignant neoplasm of breast: Secondary | ICD-10-CM

## 2021-11-29 ENCOUNTER — Ambulatory Visit
Admission: RE | Admit: 2021-11-29 | Discharge: 2021-11-29 | Disposition: A | Discharge status: Home / Self Care | Payer: BC Managed Care – PPO | Source: Ambulatory Visit | Attending: Obstetrics and Gynecology | Admitting: Obstetrics and Gynecology

## 2021-11-29 DIAGNOSIS — Z1231 Encounter for screening mammogram for malignant neoplasm of breast: Secondary | ICD-10-CM

## 2021-11-29 IMAGING — MG MM DIGITAL SCREENING BILAT W/ TOMO AND CAD
8 series · 9 of 24 positions shown · non-contrast
Comparison: Previous exam(s).

CLINICAL DATA: Screening.

EXAM:
DIGITAL SCREENING BILATERAL MAMMOGRAM WITH TOMOSYNTHESIS AND CAD
TECHNIQUE: Bilateral screening digital craniocaudal and mediolateral oblique
mammograms were obtained. Bilateral screening digital breast
tomosynthesis was performed. The images were evaluated with
computer-aided detection.

[R MLO synth-2D]
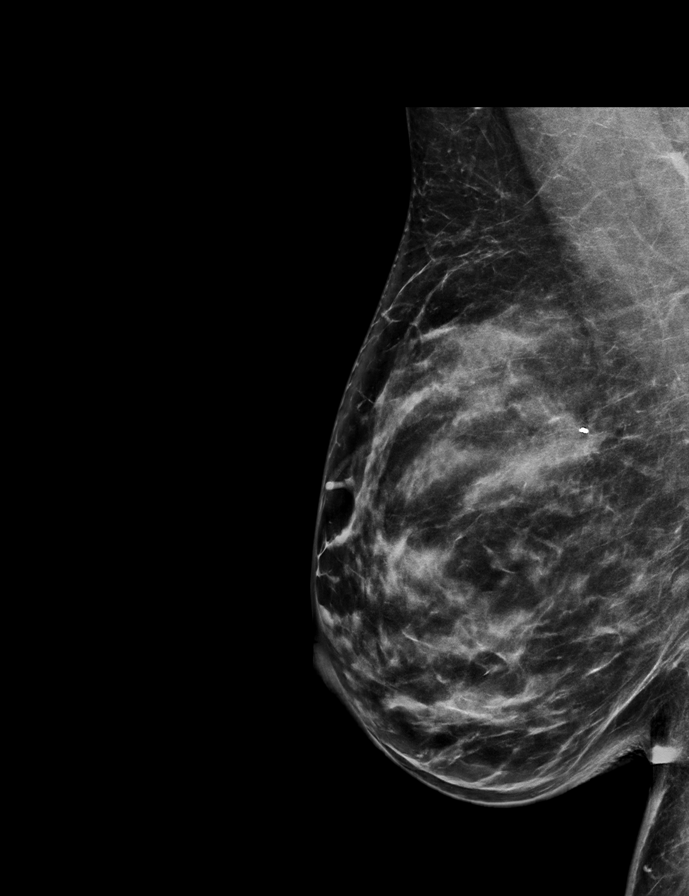

[R CC synth-2D]
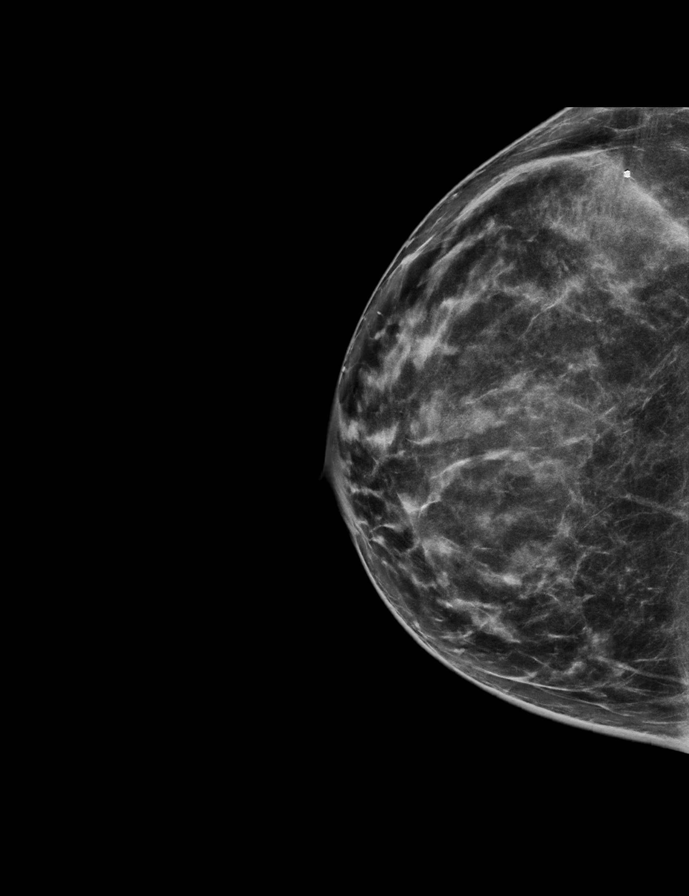

[L CC synth-2D]
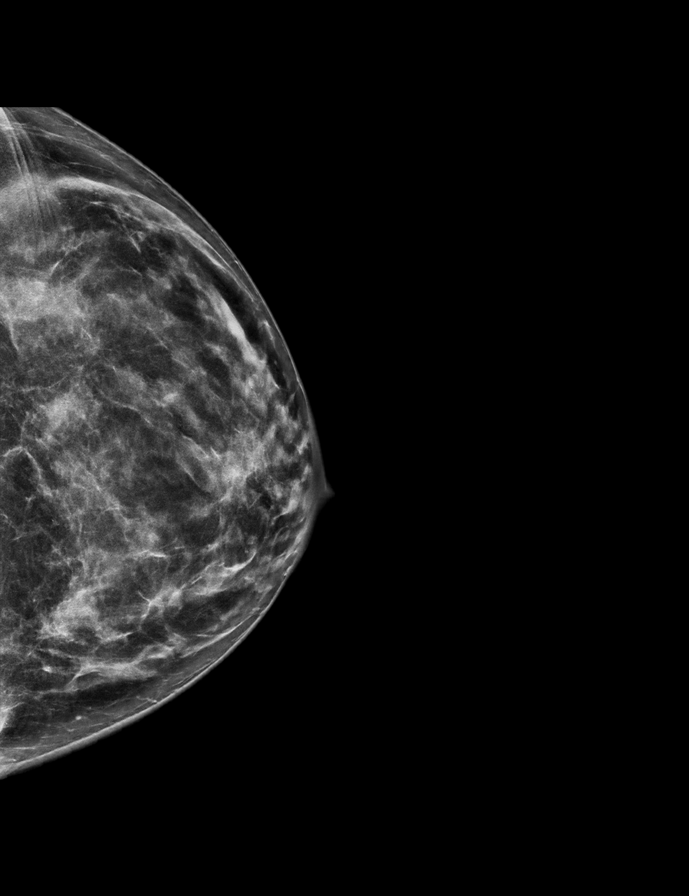

[L MLO synth-2D]
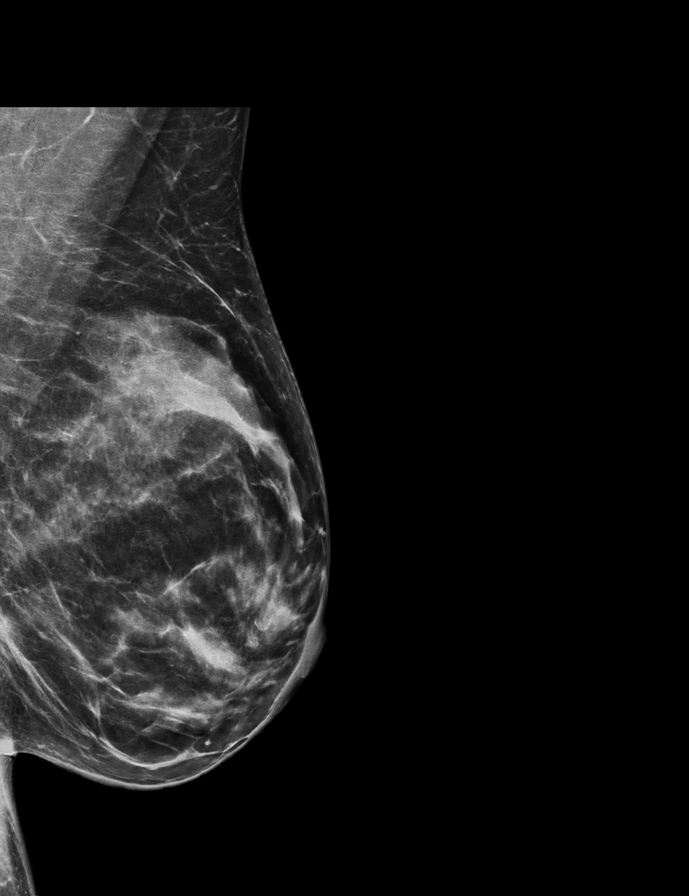

[L CC tomo · 2 of 66 frames shown]
[frame 22/66]
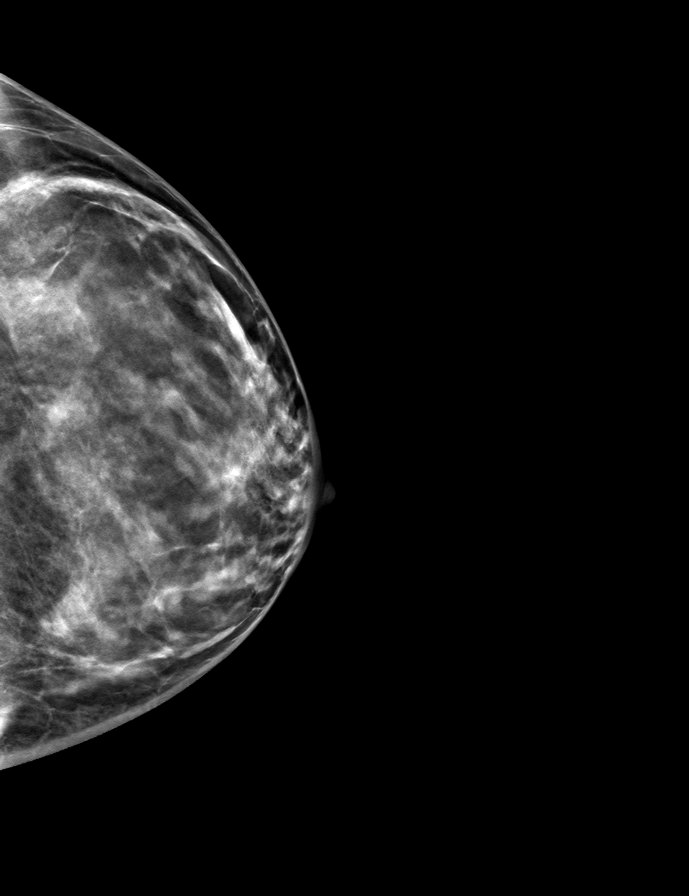
[frame 33/66]
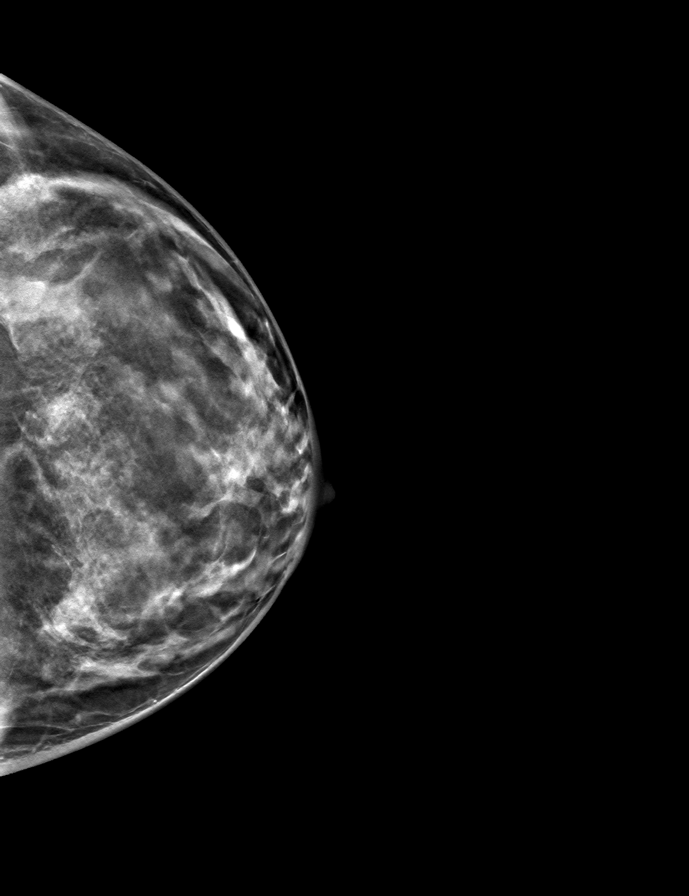

[R MLO tomo · tomo slice 38/75.0]
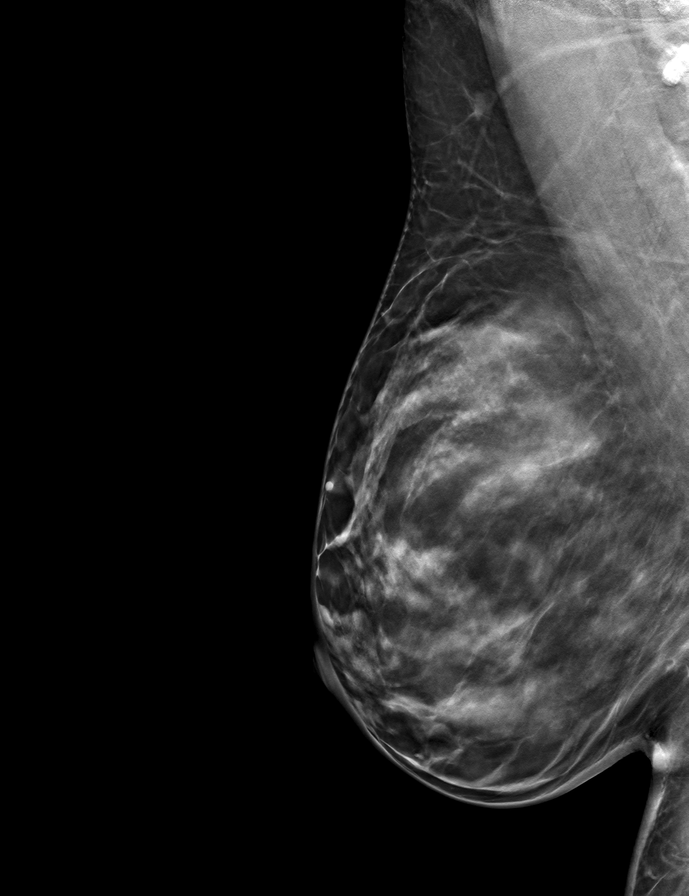

[L MLO tomo · tomo slice 36/71.0]
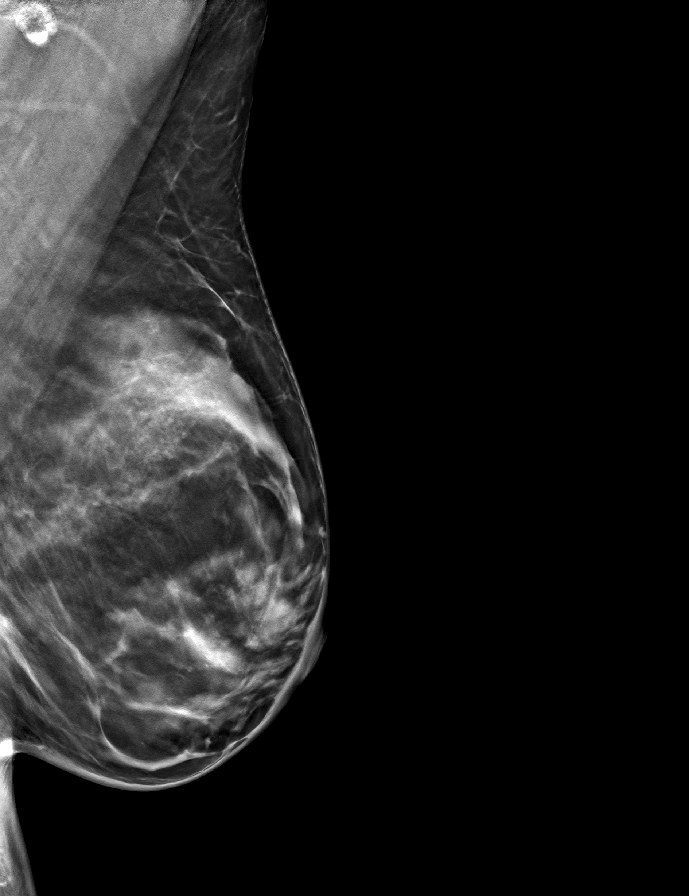

[R CC tomo · tomo slice 37/72.0]
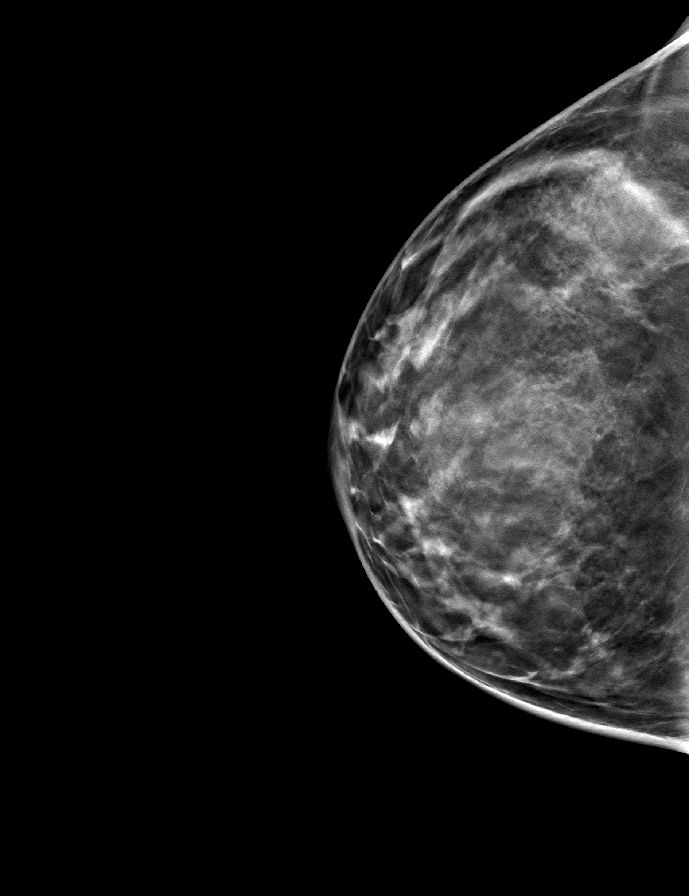

[9 of 24 positions shown; findings below may reference images not displayed]

ACR Breast Density Category c: The breast tissue is heterogeneously
dense, which may obscure small masses.
FINDINGS: There are no findings suspicious for malignancy.
IMPRESSION: No mammographic evidence of malignancy. A result letter of this
screening mammogram will be mailed directly to the patient.

RECOMMENDATION:
Screening mammogram in one year. (Code:[M8]) The patient is due
for six-month follow-up breast MRI on [DATE].

BI-RADS CATEGORY  1: Negative.

## 2021-12-01 ENCOUNTER — Telehealth: Payer: Self-pay

## 2021-12-01 NOTE — Telephone Encounter (Signed)
I spoke with patient and informed her. I asked her to call us the beginning of April to schedule and/or we will call her to arrange. Recall placed as well.

## 2021-12-01 NOTE — Telephone Encounter (Signed)
Salvadore Dom, MD  [facility] Triage  She needs to be scheduled for a f/u breast MRI in 4/23. Thanks

## 2022-01-05 ENCOUNTER — Other Ambulatory Visit (INDEPENDENT_AMBULATORY_CARE_PROVIDER_SITE_OTHER): Payer: BC Managed Care – PPO

## 2022-01-05 DIAGNOSIS — E782 Mixed hyperlipidemia: Secondary | ICD-10-CM

## 2022-01-05 LAB — LIPID PANEL
Cholesterol: 233 mg/dL — ABNORMAL HIGH (ref 0–200)
HDL: 59.9 mg/dL (ref >39.00)
LDL Cholesterol: 156 mg/dL — ABNORMAL HIGH (ref 0–99)
NonHDL: 172.79
Total CHOL/HDL Ratio: 4
Triglycerides: 82 mg/dL (ref 0.0–149.0)
VLDL: 16.4 mg/dL (ref 0.0–40.0)

## 2022-01-28 ENCOUNTER — Ambulatory Visit: Payer: BC Managed Care – PPO | Admitting: Family Medicine

## 2022-01-28 ENCOUNTER — Encounter: Payer: Self-pay | Admitting: Family Medicine

## 2022-01-28 VITALS — BP 103/75 | HR 84 | Temp 98.1°F | Wt 140.8 lb

## 2022-01-28 DIAGNOSIS — J302 Other seasonal allergic rhinitis: Secondary | ICD-10-CM

## 2022-01-28 NOTE — Progress Notes (Signed)
Subjective:  ? ? Patient ID: Meredith Lee, female    DOB: Jun 05, 1976, 46 y.o.   MRN: [id number] ? ?Chief Complaint  ?Patient presents with  ? Sinus Problem  ?  Sinus issues here lately have been wearing her out, last one day and then next she is fine. Has been taking a claritin for the past few days.  Not sure it is helping because not sure what it is causing her to feel bad some days   ? ? ?HPI ?Patient was seen today for acute concern.  Pt with sinus issues including nasal drainage, nasal congestion, watery eyes.  Pt was not sure if symptoms were 2/2 increased stress as she was often feeling tired/run down in the evening, but better the next day.  Pt notes some difficulty falling asleep at the start of the wk if works late due to mind racing.  Pt tried loratidine with some improvement in symptoms. ? ?Pt working on balance and being more productive at work.   ? ?Past Medical History:  ?Diagnosis Date  ? Amenorrhea   ? Depression   ? Family history of breast cancer   ? Family history of colon cancer   ? Fibroid   ? Heart murmur   ? as a child  ? Menstrual cramps   ? Ovarian cyst   ? ? ?Allergies  ?Allergen Reactions  ? Flagyl [Metronidazole] Anaphylaxis  ? Shellfish Allergy Diarrhea  ? ? ?ROS ?General: Denies fever, chills, night sweats, changes in weight, changes in appetite  ?HEENT: Denies headaches, ear pain, changes in vision, rhinorrhea, sore throat  +rhinorrhea, nasal congestion, nasal drainage, watery eyes ?CV: Denies CP, palpitations, SOB, orthopnea ?Pulm: Denies SOB, cough, wheezing ?GI: Denies abdominal pain, nausea, vomiting, diarrhea, constipation ?GU: Denies dysuria, hematuria, frequency, vaginal discharge ?Msk: Denies muscle cramps, joint pains ?Neuro: Denies weakness, numbness, tingling ?Skin: Denies rashes, bruising ?Psych: Denies depression, anxiety, hallucinations ? ?   ?Objective:  ?  ?Blood pressure 103/75, pulse 84, temperature 98.1 ?F (36.7 ?C), temperature source Oral, weight 140 lb 12.8  oz (63.9 kg), last menstrual period 07/15/2019, SpO2 98 %. ? ?Gen. Pleasant, well-nourished, in no distress, normal affect   ?HEENT: [place]/AT, face symmetric, conjunctiva clear, no scleral icterus, PERRLA, EOMI, nares patent without drainage ?Lungs: no accessory muscle use ?Cardiovascular: RRR, no peripheral edema ?Neuro:  A&Ox3, CN II-XII intact, normal gait ?Skin:  Warm, no lesions/ rash ? ? ?Wt Readings from Last 3 Encounters:  ?07/15/21 143 lb 9.6 oz (65.1 kg)  ?07/08/21 142 lb 6.4 oz (64.6 kg)  ?06/24/21 142 lb (64.4 kg)  ? ? ?Lab Results  ?Component Value Date  ? WBC 3.7 (L) 07/15/2021  ? HGB 12.8 07/15/2021  ? HCT 38.0 07/15/2021  ? PLT 240 07/15/2021  ? GLUCOSE 88 07/01/2021  ? CHOL 233 (H) 01/05/2022  ? TRIG 82.0 01/05/2022  ? HDL 59.90 01/05/2022  ? LDLCALC 156 (H) 01/05/2022  ? ALT 17 07/01/2021  ? AST 16 07/01/2021  ? NA 138 07/01/2021  ? K 4.1 07/01/2021  ? CL 103 07/01/2021  ? CREATININE 0.59 07/01/2021  ? BUN 9 07/01/2021  ? CO2 26 07/01/2021  ? TSH 1.25 07/01/2021  ? HGBA1C 4.9 07/01/2021  ? ? ?Assessment/Plan: ? ?Seasonal allergies ?-symptoms likely 2/2 allergies given increased pollen/change in weather. ?-advised increased stress can decreased the immune system making one more prone to getting sick. ?-discussed the importance of good nutrition, sleep, water intake. ?-can use OTC nasal saline rinse, local honey, flonase,  or antihistamine for symptoms. ?-given cetirizine samples in clinic. ? ?F/u prn for continued or worsening symptoms ? ?Grier Mitts, MD ?

## 2022-02-14 ENCOUNTER — Telehealth: Payer: Self-pay | Admitting: Family Medicine

## 2022-02-14 DIAGNOSIS — J302 Other seasonal allergic rhinitis: Secondary | ICD-10-CM

## 2022-02-21 NOTE — Telephone Encounter (Signed)
Okay for referral, but would likely benefit from allergist referral not ENT. ?

## 2022-03-08 ENCOUNTER — Other Ambulatory Visit: Payer: Self-pay | Admitting: Obstetrics and Gynecology

## 2022-03-08 DIAGNOSIS — Z9189 Other specified personal risk factors, not elsewhere classified: Secondary | ICD-10-CM

## 2022-03-14 ENCOUNTER — Telehealth: Payer: Self-pay

## 2022-03-14 DIAGNOSIS — Z803 Family history of malignant neoplasm of breast: Secondary | ICD-10-CM

## 2022-03-14 DIAGNOSIS — Z9189 Other specified personal risk factors, not elsewhere classified: Secondary | ICD-10-CM

## 2022-03-14 DIAGNOSIS — N6311 Unspecified lump in the right breast, upper outer quadrant: Secondary | ICD-10-CM

## 2022-03-14 NOTE — Telephone Encounter (Signed)
Santiago Glad at [facility] called because patient is scheduled for Bilat Breast MRI on Satuday 03/19/22 and her [employer] is requiring PA.  Santiago Glad said [facility] must receive it by Friday at noon or they will have to cancel her MRI until it is available. ? ?Will route to [facility] high priority for PA. ?

## 2022-03-15 NOTE — Telephone Encounter (Addendum)
PA initiated via [employer]. ? ?Staff message sent to Dr. Talbert Nan regarding peer-to-peer. ?

## 2022-03-17 NOTE — Telephone Encounter (Signed)
I called for PA, left a detailed message. Will fax MRI's.  ?

## 2022-03-17 NOTE — Telephone Encounter (Signed)
PA medical records faxed via Epic to [phone] Attn: Appeals Team. ?

## 2022-03-18 NOTE — Telephone Encounter (Signed)
Breast MRI denied. See letter from [employer] dated 03/18/22. [facility] has notified patient, appt for 5/13 has been cancelled.  ? ?New order placed for MRI breast bilateral w/wo contrast for 6 mo f/u of right breast mass.  ? ?Patient is not due for yearly screening MRI for increased risk of breast cancer until 07/2022 ? ?Routing to [employer] [employer] for follow up.  ? ?Cc: Dr. Talbert Nan ?

## 2022-03-18 NOTE — Telephone Encounter (Signed)
Fax back from [facility] stating that Breast MRI has been denied by insurance. ?

## 2022-03-19 ENCOUNTER — Other Ambulatory Visit: Payer: BC Managed Care – PPO

## 2022-03-22 NOTE — Telephone Encounter (Signed)
Authorization obtained. [employer] PA#: [id number] valid 03/21/2022-04/19/2022. ? ?Contacted Ashley at [facility] to reschedule patient for breast MRI. ? ?Encounter closed. ?

## 2022-03-28 NOTE — Progress Notes (Unsigned)
New Patient Note  RE: Meredith Lee MRN: [id number] DOB: 29-Oct-1976 Date of Office Visit: 03/29/2022  Consult requested by: Billie Ruddy, MD Primary care provider: Billie Ruddy, MD  Chief Complaint: No chief complaint on file.  History of Present Illness: I had the pleasure of seeing Meredith Lee for initial evaluation at the [facility] on 03/28/2022. She is a 46 y.o. female, who is referred here by Billie Ruddy, MD for the evaluation of seasonal allergies.  She reports symptoms of ***. Symptoms have been going on for *** years. The symptoms are present *** all year around with worsening in ***. Other triggers include exposure to ***. Anosmia: ***. Headache: ***. She has used *** with ***fair improvement in symptoms. Sinus infections: ***. Previous work up includes: ***. Previous ENT evaluation: ***. Previous sinus imaging: ***. History of nasal polyps: ***. Last eye exam: ***. History of reflux: ***.  Assessment and Plan: Meredith Lee is a 46 y.o. female with: No problem-specific Assessment & Plan notes found for this encounter.  No follow-ups on file.  No orders of the defined types were placed in this encounter.  Lab Orders  No laboratory test(s) ordered today    Other allergy screening: Asthma: {Blank single:19197::"yes","no"} Rhino conjunctivitis: {Blank single:19197::"yes","no"} Food allergy: {Blank single:19197::"yes","no"} Medication allergy: {Blank single:19197::"yes","no"} Hymenoptera allergy: {Blank single:19197::"yes","no"} Urticaria: {Blank single:19197::"yes","no"} Eczema:{Blank single:19197::"yes","no"} History of recurrent infections suggestive of immunodeficency: {Blank single:19197::"yes","no"}  Diagnostics: Spirometry:  Tracings reviewed. Her effort: {Blank single:19197::"Good reproducible efforts.","It was hard to get consistent efforts and there is a question as to whether this reflects a maximal maneuver.","Poor  effort, data can not be interpreted."} FVC: ***L FEV1: ***L, ***% predicted FEV1/FVC ratio: ***% Interpretation: {Blank single:19197::"Spirometry consistent with mild obstructive disease","Spirometry consistent with moderate obstructive disease","Spirometry consistent with severe obstructive disease","Spirometry consistent with possible restrictive disease","Spirometry consistent with mixed obstructive and restrictive disease","Spirometry uninterpretable due to technique","Spirometry consistent with normal pattern","No overt abnormalities noted given today's efforts"}.  Please see scanned spirometry results for details.  Skin Testing: {Blank single:19197::"Select foods","Environmental allergy panel","Environmental allergy panel and select foods","Food allergy panel","None","Deferred due to recent antihistamines use"}. *** Results discussed with patient/family.   Past Medical History: Patient Active Problem List   Diagnosis Date Noted  . Cervical radiculopathy 09/10/2020  . Neutropenia ([place]) 05/25/2020  . Digestive problems 11/08/2019  . Status post laparoscopic hysterectomy 08/19/2019  . Genetic testing 08/12/2019  . Family history of breast cancer   . Family history of colon cancer    Past Medical History:  Diagnosis Date  . Amenorrhea   . Depression   . Family history of breast cancer   . Family history of colon cancer   . Fibroid   . Heart murmur    as a child  . Menstrual cramps   . Ovarian cyst    Past Surgical History: Past Surgical History:  Procedure Laterality Date  . BREAST BIOPSY Right 08/09/2021  . CYSTOSCOPY N/A 08/19/2019   Procedure: CYSTOSCOPY;  Surgeon: Salvadore Dom, MD;  Location: [facility];  Service: Gynecology;  Laterality: N/A;  . LYSIS OF ADHESION N/A 08/19/2019   Procedure: LYSIS OF ADHESION;  Surgeon: Salvadore Dom, MD;  Location: [facility];  Service: Gynecology;  Laterality: N/A;  . MYOMECTOMY  2012   . SHOULDER ADHESION RELEASE Left 02/18/2021   Dr.Timothy Percell Miller  . TOTAL LAPAROSCOPIC HYSTERECTOMY WITH SALPINGECTOMY Bilateral 08/19/2019   Procedure: TOTAL LAPAROSCOPIC HYSTERECTOMY WITH BILATERAL SALPINGECTOMY,LEFT OOPHORECTOMY, EXTENSIVE LYSIS OF  ADHESIONS;  Surgeon: Salvadore Dom, MD;  Location: [facility];  Service: Gynecology;  Laterality: Bilateral;   Medication List:  Current Outpatient Medications  Medication Sig Dispense Refill  . cyclobenzaprine (FLEXERIL) 5 MG tablet Take 1 tablet (5 mg total) by mouth at bedtime as needed for muscle spasms. 30 tablet 0  . MAGNESIUM PO Take 500 mg by mouth daily.     Marland Kitchen VITAMIN D PO Take 5,000 Units by mouth daily.      No current facility-administered medications for this visit.   Allergies: Allergies  Allergen Reactions  . Flagyl [Metronidazole] Anaphylaxis  . Shellfish Allergy Diarrhea   Social History: Social History   Socioeconomic History  . Marital status: Significant Other    Spouse name: Not on file  . Number of children: Not on file  . Years of education: Not on file  . Highest education level: Not on file  Occupational History  . Not on file  Tobacco Use  . Smoking status: Former    Types: Cigars  . Smokeless tobacco: Never  Vaping Use  . Vaping Use: Never used  Substance and Sexual Activity  . Alcohol use: Yes    Comment: rarely  . Drug use: No  . Sexual activity: Not Currently    Birth control/protection: None    Comment: female partner  Other Topics Concern  . Not on file  Social History Narrative  . Not on file   Social Determinants of Health   Financial Resource Strain: Not on file  Food Insecurity: Not on file  Transportation Needs: Not on file  Physical Activity: Not on file  Stress: Not on file  Social Connections: Not on file   Lives in a ***. Smoking: *** Occupation: ***  Environmental HistoryFreight forwarder in the house: Magazine features editor in the family room: {Blank single:19197::"yes","no"} Carpet in the bedroom: {Blank single:19197::"yes","no"} Heating: {Blank single:19197::"electric","gas","heat pump"} Cooling: {Blank single:19197::"central","window","heat pump"} Pet: {Blank single:19197::"yes ***","no"}  Family History: Family History  Problem Relation Age of Onset  . Fibroids Mother   . Colon polyps Mother   . Diabetes Father   . Fibroids Sister   . Colon cancer Maternal Grandmother   . Lung cancer Maternal Grandmother   . Breast cancer Maternal Grandmother        dx <50  . Cancer Maternal Grandfather   . Breast cancer Paternal Grandmother 74  . Heart attack Paternal Grandmother   . Breast cancer Paternal Aunt 98       recurrance at 47  . Brain cancer Paternal Aunt   . Obesity Maternal Aunt   . Diabetes Maternal Aunt   . Heart failure Maternal Aunt   . Heart disease Paternal Uncle    Problem                               Relation Asthma                                   *** Eczema                                *** Food allergy                          *** Allergic  rhino conjunctivitis     ***  Review of Systems  Constitutional:  Negative for appetite change, chills, fever and unexpected weight change.  HENT:  Negative for congestion and rhinorrhea.   Eyes:  Negative for itching.  Respiratory:  Negative for cough, chest tightness, shortness of breath and wheezing.   Cardiovascular:  Negative for chest pain.  Gastrointestinal:  Negative for abdominal pain.  Genitourinary:  Negative for difficulty urinating.  Skin:  Negative for rash.  Neurological:  Negative for headaches.   Objective: LMP 07/15/2019  There is no height or weight on file to calculate BMI. Physical Exam Vitals and nursing note reviewed.  Constitutional:      Appearance: Normal appearance. She is well-developed.  HENT:     Head: Normocephalic and atraumatic.     Right Ear: Tympanic membrane and  external ear normal.     Left Ear: Tympanic membrane and external ear normal.     Nose: Nose normal.     Mouth/Throat:     Mouth: Mucous membranes are moist.     Pharynx: Oropharynx is clear.  Eyes:     Conjunctiva/sclera: Conjunctivae normal.  Cardiovascular:     Rate and Rhythm: Normal rate and regular rhythm.     Heart sounds: Normal heart sounds. No murmur heard.   No friction rub. No gallop.  Pulmonary:     Effort: Pulmonary effort is normal.     Breath sounds: Normal breath sounds. No wheezing, rhonchi or rales.  Musculoskeletal:     Cervical back: Neck supple.  Skin:    General: Skin is warm.     Findings: No rash.  Neurological:     Mental Status: She is alert and oriented to person, place, and time.  Psychiatric:        Behavior: Behavior normal.  The plan was reviewed with the patient/family, and all questions/concerned were addressed.  It was my pleasure to see Meredith Lee today and participate in her care. Please feel free to contact me with any questions or concerns.  Sincerely,  Rexene Alberts, DO Allergy & Immunology  [facility] office: [place] office: [phone]

## 2022-03-29 ENCOUNTER — Ambulatory Visit: Payer: BC Managed Care – PPO | Admitting: Allergy

## 2022-03-29 ENCOUNTER — Encounter: Payer: Self-pay | Admitting: Allergy

## 2022-03-29 VITALS — BP 103/70 | HR 71 | Temp 97.9°F | Ht 63.25 in | Wt 144.5 lb

## 2022-03-29 DIAGNOSIS — R058 Other specified cough: Secondary | ICD-10-CM | POA: Diagnosis not present

## 2022-03-29 DIAGNOSIS — T781XXD Other adverse food reactions, not elsewhere classified, subsequent encounter: Secondary | ICD-10-CM

## 2022-03-29 DIAGNOSIS — J3089 Other allergic rhinitis: Secondary | ICD-10-CM

## 2022-03-29 DIAGNOSIS — R059 Cough, unspecified: Secondary | ICD-10-CM | POA: Insufficient documentation

## 2022-03-29 DIAGNOSIS — Z9109 Other allergy status, other than to drugs and biological substances: Secondary | ICD-10-CM | POA: Insufficient documentation

## 2022-03-29 DIAGNOSIS — H1013 Acute atopic conjunctivitis, bilateral: Secondary | ICD-10-CM | POA: Insufficient documentation

## 2022-03-29 MED ORDER — RYALTRIS 665-25 MCG/ACT NA SUSP: 1.0000 | Freq: Two times a day (BID) | NASAL | 5 refills | Status: AC

## 2022-03-29 NOTE — Assessment & Plan Note (Signed)
1 episode of hives and subsequent diarrhea and vomiting after shellfish ingestion.  No issues with finned fish.  Skin testing last year at outside allergist was negative to shellfish per patient report.  She is not interested in retesting or reintroduction at this time. . Continue to avoid shellfish. . For mild symptoms you can take over the counter antihistamines such as Benadryl and monitor symptoms closely. If symptoms worsen or if you have severe symptoms including breathing issues, throat closure, significant swelling, whole body hives, severe diarrhea and vomiting, lightheadedness then seek immediate medical care.

## 2022-03-29 NOTE — Assessment & Plan Note (Signed)
.   See assessment and plan as above. 

## 2022-03-29 NOTE — Assessment & Plan Note (Signed)
Used albuterol in 2014 with good benefit. No prior diagnosis of asthma.  Noted dry coughing at night at times x 2 years.  Monitor symptoms.  If no improvement, get spirometry at next visit.

## 2022-03-29 NOTE — Patient Instructions (Addendum)
Today's skin testing showed: Trees, grass, ragweed, weed, dust mites, cat.   Results given.  Environmental allergies Start environmental control measures as below. Use over the counter antihistamines such as Zyrtec (cetirizine), Claritin (loratadine), Allegra (fexofenadine), or Xyzal (levocetirizine) daily as needed. May take twice a day during allergy flares. May switch antihistamines every few months. Use olopatadine eye drops 0.1% twice a day as needed for itchy/watery eyes. Start Ryaltris (olopatadine + mometasone nasal spray combination) 1-2 sprays per nostril twice a day. Sample given. If this works well for you then have the pharmacy send you the prescription.  Consider allergy injections for long term control if above medications do not help the symptoms - handout given.   Food Continue to avoid shellfish. For mild symptoms you can take over the counter antihistamines such as Benadryl and monitor symptoms closely. If symptoms worsen or if you have severe symptoms including breathing issues, throat closure, significant swelling, whole body hives, severe diarrhea and vomiting, lightheadedness then seek immediate medical care.  Follow up in 4 months or sooner if needed.    Reducing Pollen Exposure Pollen seasons: trees (spring), grass (summer) and ragweed/weeds (fall). Keep windows closed in your home and car to lower pollen exposure.  Install air conditioning in the bedroom and throughout the house if possible.  Avoid going out in dry windy days - especially early morning. Pollen counts are highest between 5 - 10 AM and on dry, hot and windy days.  Save outside activities for late afternoon or after a heavy rain, when pollen levels are lower.  Avoid mowing of grass if you have grass pollen allergy. Be aware that pollen can also be transported indoors on people and pets.  Dry your clothes in an automatic dryer rather than hanging them outside where they might collect pollen.  Rinse  hair and eyes before bedtime. Control of House Dust Mite Allergen Dust mite allergens are a common trigger of allergy and asthma symptoms. While they can be found throughout the house, these microscopic creatures thrive in warm, humid environments such as bedding, upholstered furniture and carpeting. Because so much time is spent in the bedroom, it is essential to reduce mite levels there.  Encase pillows, mattresses, and box springs in special allergen-proof fabric covers or airtight, zippered plastic covers.  Bedding should be washed weekly in hot water (130 F) and dried in a hot dryer. Allergen-proof covers are available for comforters and pillows that can't be regularly washed.  Wash the allergy-proof covers every few months. Minimize clutter in the bedroom. Keep pets out of the bedroom.  Keep humidity less than 50% by using a dehumidifier or air conditioning. You can buy a humidity measuring device called a hygrometer to monitor this.  If possible, replace carpets with hardwood, linoleum, or washable area rugs. If that's not possible, vacuum frequently with a vacuum that has a HEPA filter. Remove all upholstered furniture and non-washable window drapes from the bedroom. Remove all non-washable stuffed toys from the bedroom.  Wash stuffed toys weekly.  Pet Allergen Avoidance: Contrary to popular opinion, there are no "hypoallergenic" breeds of dogs or cats. That is because people are not allergic to an animal's hair, but to an allergen found in the animal's saliva, dander (dead skin flakes) or urine. Pet allergy symptoms typically occur within minutes. For some people, symptoms can build up and become most severe 8 to 12 hours after contact with the animal. People with severe allergies can experience reactions in public places if dander  has been transported on the pet owners' clothing. Keeping an animal outdoors is only a partial solution, since homes with pets in the yard still have higher  concentrations of animal allergens. Before getting a pet, ask your allergist to determine if you are allergic to animals. If your pet is already considered part of your family, try to minimize contact and keep the pet out of the bedroom and other rooms where you spend a great deal of time. As with dust mites, vacuum carpets often or replace carpet with a hardwood floor, tile or linoleum. High-efficiency particulate air (HEPA) cleaners can reduce allergen levels over time. While dander and saliva are the source of cat and dog allergens, urine is the source of allergens from rabbits, hamsters, mice and [place] pigs; so ask a non-allergic family member to clean the animal's cage. If you have a pet allergy, talk to your allergist about the potential for allergy immunotherapy (allergy shots). This strategy can often provide long-term relief.

## 2022-03-29 NOTE — Assessment & Plan Note (Signed)
Rhinoconjunctivitis symptoms for the past 3 months.  Using Allegra and olopatadine as needed with good benefit.  No prior allergy/ENT evaluation.  1 dog at home.  Today's skin testing showed: positive to trees, grass, ragweed, weed, dust mites, cat.   Start environmental control measures as below.  Use over the counter antihistamines such as Zyrtec (cetirizine), Claritin (loratadine), Allegra (fexofenadine), or Xyzal (levocetirizine) daily as needed. May take twice a day during allergy flares. May switch antihistamines every few months.  Use olopatadine eye drops 0.1% twice a day as needed for itchy/watery eyes.  Start Ryaltris (olopatadine + mometasone nasal spray combination) 1-2 sprays per nostril twice a day. Sample given.  If this works well for you then have the pharmacy send you the prescription.   Consider allergy injections for long term control if above medications do not help the symptoms - handout given.

## 2022-04-18 ENCOUNTER — Ambulatory Visit
Admission: RE | Admit: 2022-04-18 | Discharge: 2022-04-18 | Disposition: A | Discharge status: Home / Self Care | Payer: BC Managed Care – PPO | Source: Ambulatory Visit | Attending: Obstetrics and Gynecology | Admitting: Obstetrics and Gynecology

## 2022-04-18 DIAGNOSIS — Z9189 Other specified personal risk factors, not elsewhere classified: Secondary | ICD-10-CM

## 2022-04-18 IMAGING — MR MR BREAST BILAT WO/W CM
8 of 12 series · 33 of 48 positions shown · IV contrast (7 ml gadavist)
Comparison: Previous exams.

CLINICAL DATA: High risk screening.

EXAM:
BILATERAL BREAST MRI WITH AND WITHOUT CONTRAST
TECHNIQUE: Multiplanar, multisequence MR images of both breasts were obtained
prior to and following the intravenous administration of ml of
Gadavist

[Series 2: t2_tirm_tra ipat (a-p) · axial · 3.0mm · 0.70mm/px · 1 of 55 slices shown]
[im 1/55]
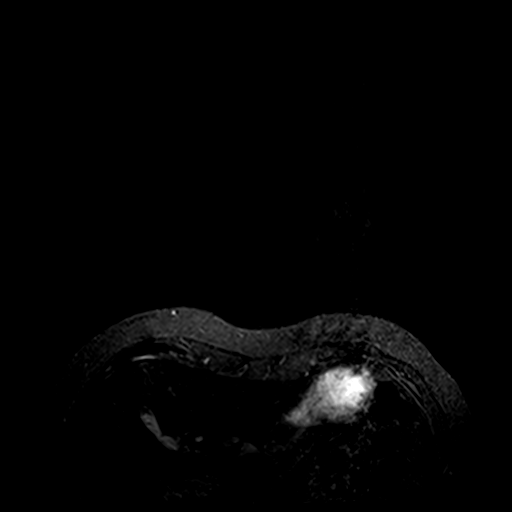

[Series 3: fl3d pre-cm no · axial · non-contrast · 1.2mm · 0.89mm/px · z∈[-57,+115]mm · 5 of 144 slices shown]
[im 1/144]
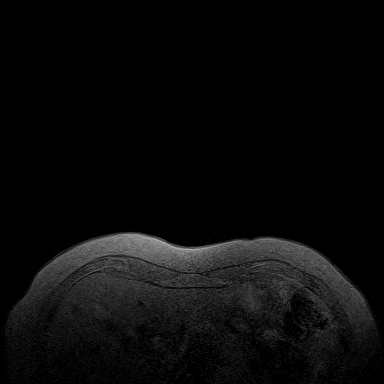
[im 36/144]
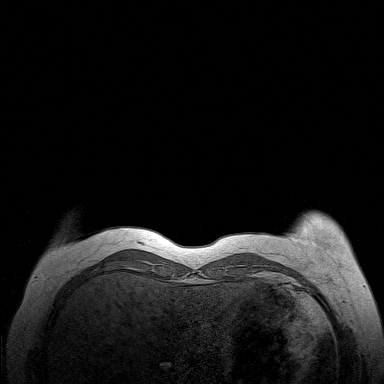
[im 72/144]
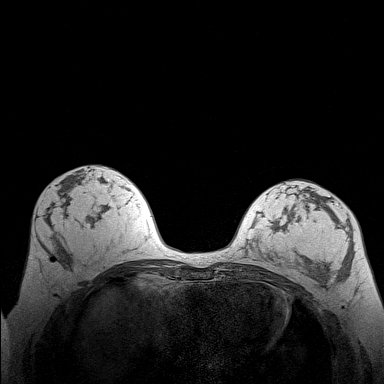
[im 108/144]
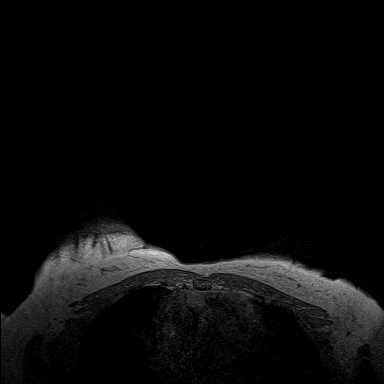
[im 144/144]
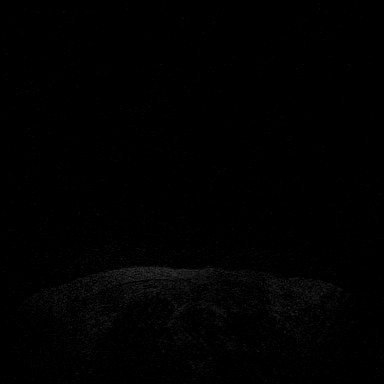

[Series 4: fl3d pre-cm · axial · non-contrast · 1.2mm · 0.89mm/px · z∈[-57,+115]mm · 5 of 144 slices shown]
[im 1/144]
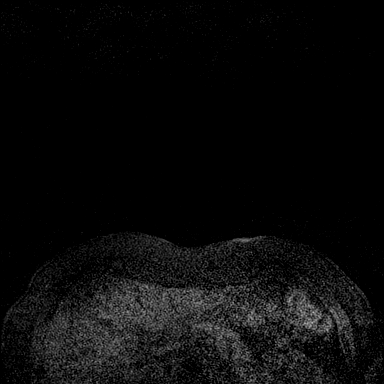
[im 36/144]
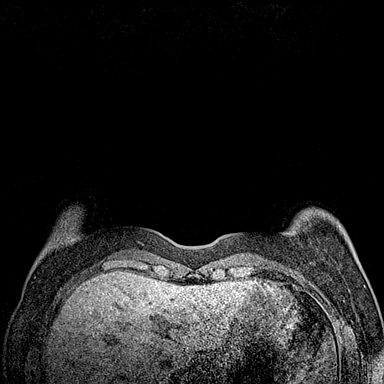
[im 72/144]
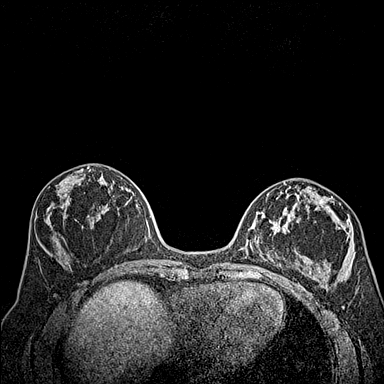
[im 108/144]
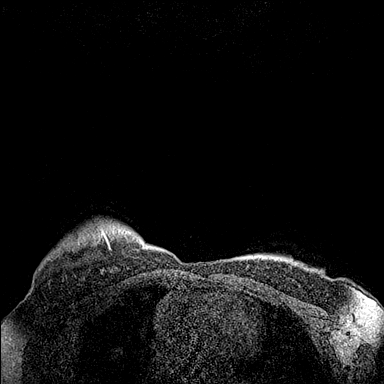
[im 144/144]
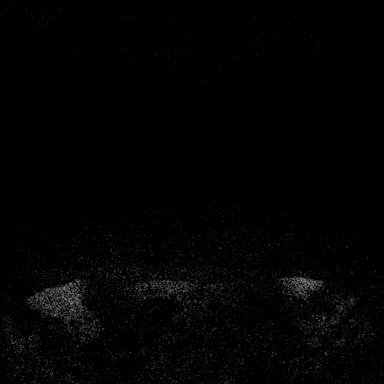

[Series 5: fl3d post-cm 20 · axial · 1.2mm · 0.89mm/px · z∈[-57,+115]mm · 5 of 144 slices shown (1 of 3)]
[im 1/144]
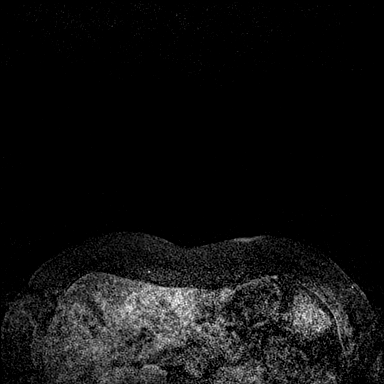
[im 36/144]
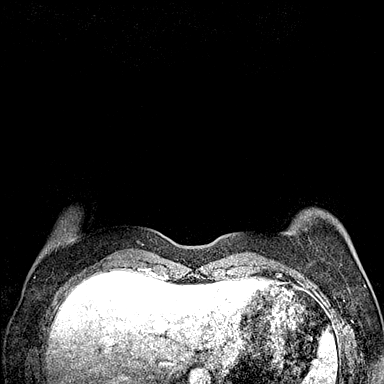
[im 72/144]
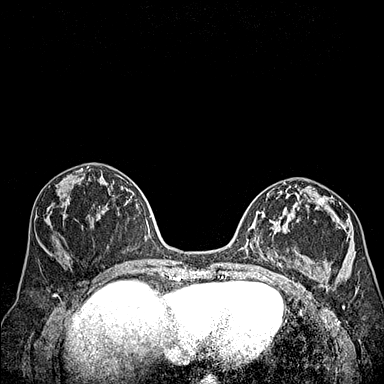
[im 108/144]
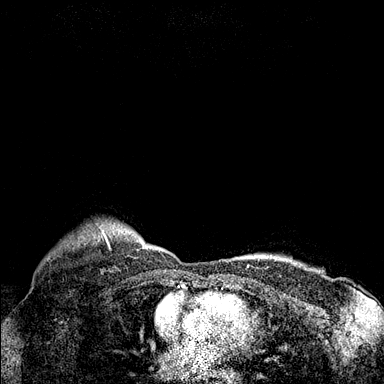
[im 144/144]
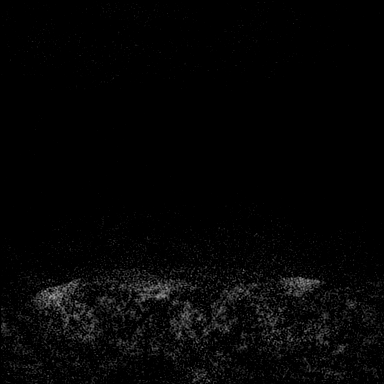

[Series 6: fl3d post-cm 20 · axial · 1.2mm · 0.89mm/px · z∈[-57,+115]mm · 5 of 144 slices shown (2 of 3)]
[im 1/144]
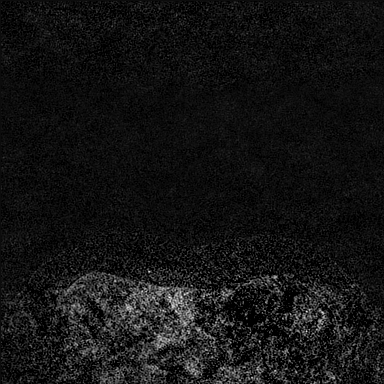
[im 36/144]
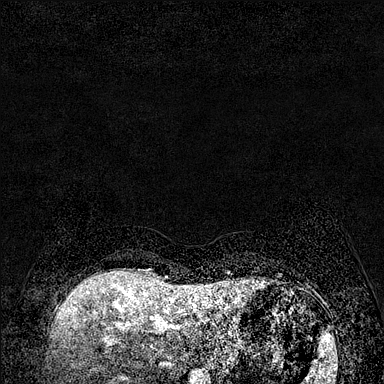
[im 72/144]
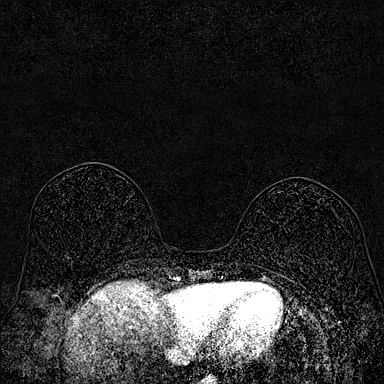
[im 108/144]
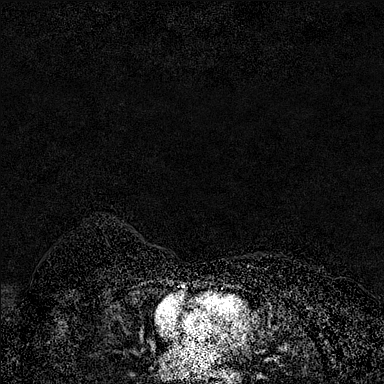
[im 144/144]
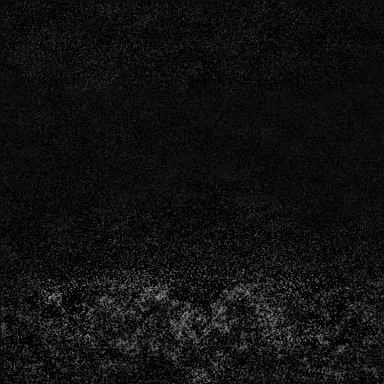

[Series 7: fl3d post-cm 20 · axial · 172.8mm · 0.89mm/px · 1 of 1 slices shown (3 of 3)]
[im 1/1]
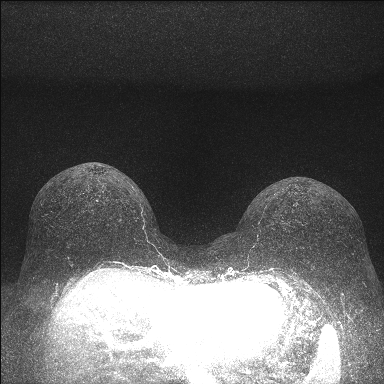

[Series 8: fl3d post-cm 3 · axial · 1.2mm · 0.89mm/px · z∈[-57,+115]mm · 6 of 144 slices shown (1 of 2)]
[im 1/144]
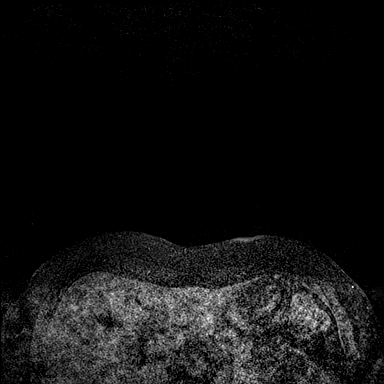
[im 29/144]
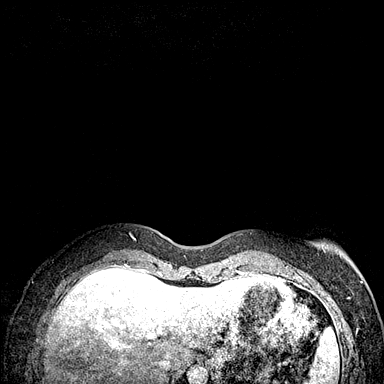
[im 58/144]
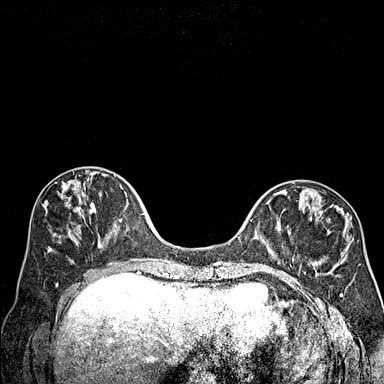
[im 86/144]
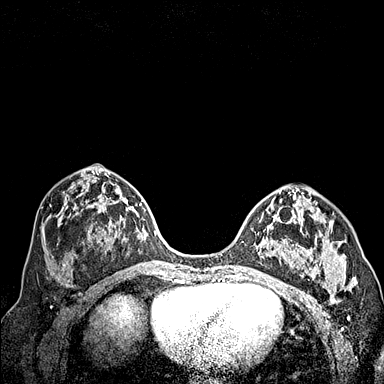
[im 115/144]
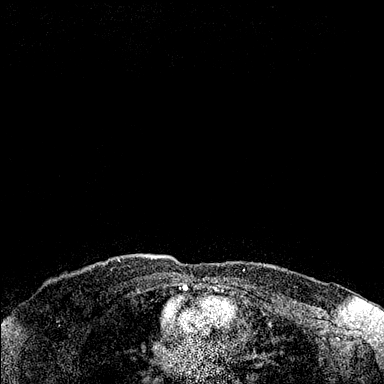
[im 144/144]
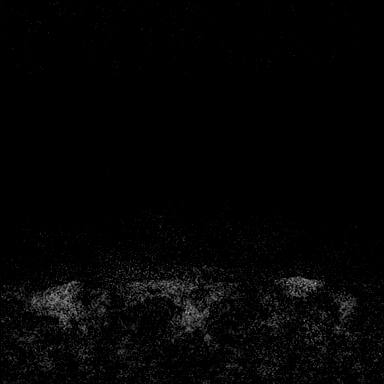

[Series 9: fl3d post-cm 3 · axial · 1.2mm · 0.89mm/px · z∈[-57,+80]mm · 5 of 144 slices shown (2 of 2)]
[im 1/144]
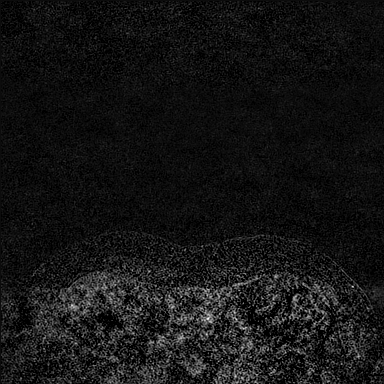
[im 29/144]
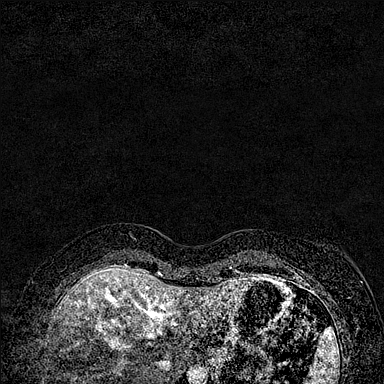
[im 58/144]
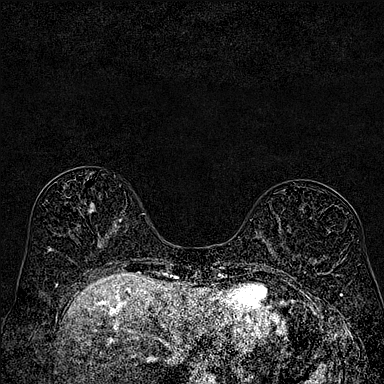
[im 86/144]
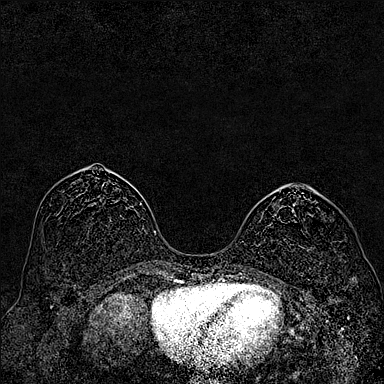
[im 115/144]
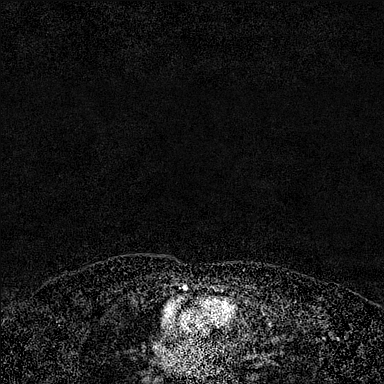

[33 of 48 positions shown; findings below may reference images not displayed]

Three-dimensional MR images were rendered by post-processing of the
original MR data on an independent workstation. The
three-dimensional MR images were interpreted, and findings are
reported in the following complete MRI report for this study. Three
dimensional images were evaluated at the independent interpreting
workstation using the DynaCAD thin client.
FINDINGS: Breast composition: c. Heterogeneous fibroglandular tissue.

Background parenchymal enhancement: Minimal

Right breast: No mass or abnormal enhancement.

Left breast: No mass or abnormal enhancement.

Lymph nodes: No abnormal appearing lymph nodes.

Ancillary findings:  None.
IMPRESSION: No MRI evidence of malignancy in either breast.

RECOMMENDATION:
1.  Continue routine annual screening mammography.

2. The American Cancer Society recommends annual MRI and mammography
in
patients with an estimated lifetime risk of developing breast cancer
greater than 20 - 25%, or who are known or suspected to be positive
for the breast cancer gene.

BI-RADS CATEGORY  1: Negative.

## 2022-04-18 MED ORDER — GADOBUTROL 1 MMOL/ML IV SOLN
7.0000 mL | Freq: Once | INTRAVENOUS | Status: AC | PRN
  Administered 2022-04-18: 7 mL via INTRAVENOUS

## 2022-04-21 ENCOUNTER — Ambulatory Visit: Payer: BC Managed Care – PPO | Admitting: Obstetrics and Gynecology

## 2022-04-21 ENCOUNTER — Encounter: Payer: Self-pay | Admitting: Obstetrics and Gynecology

## 2022-04-21 VITALS — BP 104/62 | HR 67 | Ht 63.0 in | Wt 145.0 lb

## 2022-04-21 DIAGNOSIS — N951 Menopausal and female climacteric states: Secondary | ICD-10-CM

## 2022-04-21 DIAGNOSIS — R6882 Decreased libido: Secondary | ICD-10-CM | POA: Diagnosis not present

## 2022-04-21 NOTE — Patient Instructions (Addendum)
You can try estoven for hot flashes and estroven pm for night sweats.   Menopause and Hormone Replacement Therapy Menopause is a normal time of life when menstrual periods stop completely and the ovaries stop producing the female hormones estrogen and progesterone. Low levels of these hormones can affect your health and cause symptoms. Hormone replacement therapy (HRT) can relieve some of those symptoms. HRT is the use of artificial (synthetic) hormones to replace hormones that your body has stopped producing because you have reached menopause. Types of HRT  HRT may consist of the synthetic hormones estrogen and progestin, or it may consist of estrogen-only therapy. You and your health care provider will decide which form of HRT is best for you. If you choose to be on HRT and you have a uterus, estrogen and progestin are usually prescribed. Estrogen-only therapy is used for women who do not have a uterus. Possible options for taking HRT include: Pills. Patches. Gels. Sprays. Vaginal cream. Vaginal rings. Vaginal inserts. The amount of hormones that you take and how long you take them varies according to your health. It is important to: Begin HRT with the lowest possible dosage. Stop HRT as soon as your health care provider tells you to stop. Work with your health care provider so that you feel informed and comfortable with your decisions. Tell a health care provider about: Any allergies you have. Whether you have had blood clots or know of any risk factors you may have for blood clots. Whether you or family members have had cancer, especially cancer of the breasts, ovaries, or uterus. Any surgeries you have had. All medicines you are taking, including vitamins, herbs, eye drops, creams, and over-the-counter medicines. Whether you are pregnant or may be pregnant. Any medical conditions you have. What are the benefits? HRT can reduce the frequency and severity of menopausal symptoms.  Benefits of HRT vary according to the kind of symptoms that you have, how severe they are, and your overall health. HRT may help to improve the following symptoms of menopause: Hot flashes and night sweats. These are sudden feelings of heat that spread over the face and body. The skin may turn red, like a blush. Night sweats are hot flashes that happen while you are sleeping or trying to sleep. Bone loss (osteoporosis). The body loses calcium more quickly after menopause, causing the bones to become weaker. This can increase the risk for bone breaks (fractures). Vaginal dryness. The lining of the vagina can become thin and dry, which can cause pain during sex or cause infection, burning, or itching. Urinary tract infections. Urinary incontinence. This is the inability to control when you urinate. Irritability. Short-term memory problems. What are the risks? Risks of HRT vary depending on your individual health and medical history. Risks of HRT also depend on whether you receive both estrogen and progestin or you receive estrogen only. HRT may increase the risk of: Spotting. This is when a small amount of blood leaks from the vagina unexpectedly. Endometrial cancer. This cancer is in the lining of the uterus (endometrium). Breast cancer. Increased density of breast tissue. This can make it harder to find breast cancer on a breast X-ray (mammogram). Stroke. Heart disease. Blood clots. Gallbladder disease or liver disease. Risks of HRT can increase if you have any of the following conditions: Endometrial cancer. Liver disease. Heart disease. Breast cancer. History of blood clots. History of stroke. Follow these instructions at home: Pap tests Have Pap tests done as often as told by  your health care provider. A Pap test is sometimes called a Pap smear. It is a screening test that is used to check for signs of cancer of the cervix and vagina. A Pap test can also identify the presence of  infection or precancerous changes. Pap tests may be done: Every 3 years, starting at age 72. Every 5 years, starting after age 62, in combination with testing for human papillomavirus (HPV). More often or less often depending on other medical conditions you have, your age, and other risk factors. It is up to you to get the results of your Pap test. Ask your health care provider, or the department that is doing the test, when your results will be ready. General instructions Take over-the-counter and prescription medicines only as told by your health care provider. Do not use any products that contain nicotine or tobacco. These products include cigarettes, chewing tobacco, and vaping devices, such as e-cigarettes. If you need help quitting, ask your health care provider. Get mammograms, pelvic exams, and medical checkups as often as told by your health care provider. Keep all follow-up visits. This is important. Contact a health care provider if you have: Pain or swelling in your legs. Lumps or changes in your breasts or armpits. Pain, burning, or bleeding when you urinate. Unusual vaginal bleeding. Dizziness or headaches. Pain in your abdomen. Get help right away if you have: Shortness of breath. Chest pain. Slurred speech. Weakness or numbness in any part of your arms or legs. These symptoms may represent a serious problem that is an emergency. Do not wait to see if the symptoms will go away. Get medical help right away. Call your local emergency services (911 in the [place] Do not drive yourself to the hospital. Summary Menopause is a normal time of life when menstrual periods stop completely and the ovaries stop producing the female hormones estrogen and progesterone. HRT can reduce the frequency and severity of menopausal symptoms. Risks of HRT vary depending on your individual health and medical history. This information is not intended to replace advice given to you by your health care  provider. Make sure you discuss any questions you have with your health care provider. Document Revised: 04/27/2020 Document Reviewed: 04/27/2020 Elsevier Patient Education  [place].

## 2022-04-21 NOTE — Progress Notes (Signed)
GYNECOLOGY  VISIT   HPI: 46 y.o.   Significant Other Black or African American Not Hispanic or Latino  female   G0P0000 with Patient's last menstrual period was 07/11/2019.   here for vasomotor symptoms. She had a total laparoscopic hysterectomy, LSO, RS and extensive LOA in 10/20.  She is having hot flashes and night sweats. She is also experiencing sleep disturbance and low libido. We discussed testosterone last year and she didn't end up taking it.  Her symptoms come and go. Hot flashes are mostly tolerable. Night sweats have been a little better in the last week. She was having them multiple times a night, wakes up drenched. Not sleeping great.  Some vaginal dryness, uses a lubricant. Low libido, able to orgasm.   GYNECOLOGIC HISTORY: Patient's last menstrual period was 07/11/2019. 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   Digestive problems 11/08/2019   Status post laparoscopic hysterectomy 08/19/2019   Genetic testing 08/12/2019   Family history of breast cancer    Family history of colon cancer     Past Medical History:  Diagnosis Date   Amenorrhea    Depression    Family history of breast cancer    Family history of colon cancer    Fibroid    Heart murmur    as a child   Menstrual cramps    Ovarian cyst     Past Surgical History:  Procedure Laterality Date   BREAST BIOPSY Right 08/09/2021   CYSTOSCOPY N/A 08/19/2019   Procedure: CYSTOSCOPY;   Surgeon: Salvadore Dom, MD;  Location: [place];  Service: Gynecology;  Laterality: N/A;   LYSIS OF ADHESION N/A 08/19/2019   Procedure: LYSIS OF ADHESION;  Surgeon: Salvadore Dom, MD;  Location: [facility];  Service: Gynecology;  Laterality: N/A;   MYOMECTOMY  2012   SHOULDER ADHESION RELEASE Left 02/18/2021   Dr.Timothy Percell Miller   TOTAL LAPAROSCOPIC HYSTERECTOMY WITH SALPINGECTOMY Bilateral 08/19/2019   Procedure: TOTAL LAPAROSCOPIC HYSTERECTOMY WITH BILATERAL SALPINGECTOMY,LEFT OOPHORECTOMY, EXTENSIVE LYSIS OF ADHESIONS;  Surgeon: Salvadore Dom, MD;  Location: [place];  Service: Gynecology;  Laterality: Bilateral;    Current Outpatient Medications  Medication Sig Dispense Refill   cyclobenzaprine (FLEXERIL) 5 MG tablet Take 1 tablet (5 mg total) by mouth at bedtime as needed for muscle spasms. 30 tablet 0   fexofenadine (ALLEGRA ALLERGY) 180 MG tablet Take 180 mg by mouth daily.     MAGNESIUM PO Take 500 mg by mouth daily.     olopatadine (PATANOL) 0.1 % ophthalmic solution Place 1 drop into both eyes daily.     Olopatadine-Mometasone (RYALTRIS) [id number] MCG/ACT SUSP Place 1-2 sprays into the nose in the morning and at bedtime.  29 g 5   VITAMIN D PO Take 5,000 Units by mouth daily.     No current facility-administered medications for this visit.     ALLERGIES: Flagyl [metronidazole] and Shellfish allergy  Family History  Problem Relation Age of Onset   Fibroids Mother    Colon polyps Mother    Diabetes Father    Fibroids Sister    Colon cancer Maternal Grandmother    Lung cancer Maternal Grandmother    Breast cancer Maternal Grandmother        dx <50   Cancer Maternal Grandfather    Breast cancer Paternal Grandmother 79   Heart attack Paternal Grandmother    Breast cancer Paternal Aunt 75       recurrance at 20   Brain cancer Paternal Aunt    Obesity Maternal Aunt    Diabetes Maternal Aunt    Heart  failure Maternal Aunt    Heart disease Paternal Uncle     Social History   Socioeconomic History   Marital status: Significant Other    Spouse name: Not on file   Number of children: Not on file   Years of education: Not on file   Highest education level: Not on file  Occupational History   Not on file  Tobacco Use   Smoking status: Unknown   Smokeless tobacco: Never  Vaping Use   Vaping Use: Never used  Substance and Sexual Activity   Alcohol use: Not Currently   Drug use: No   Sexual activity: Not Currently    Birth control/protection: None    Comment: female partner  Other Topics Concern   Not on file  Social History Narrative   Not on file   Social Determinants of Health   Financial Resource Strain: Not on file  Food Insecurity: Not on file  Transportation Needs: Not on file  Physical Activity: Not on file  Stress: Not on file  Social Connections: Not on file  Intimate Partner Violence: Not on file    Review of Systems  All other systems reviewed and are negative.   PHYSICAL EXAMINATION:    BP 104/62   Pulse 67   Ht '5\' 3"'$  (1.6 m)   Wt 145 lb (65.8 kg)   LMP 07/11/2019   SpO2 100%   BMI 25.69 kg/m     General appearance: alert, cooperative and appears stated age  61. Perimenopausal vasomotor symptoms H/O hysterectomy, having vasomotor symptoms that are coming and going, severe at times.  - Follicle stimulating hormone -Discussed behavior changes, avoiding triggers -Discussed OTC options -Discussed the option of Effexor, SSRI, and gabapentin -Reviewed ERT, discussed the risks of clots, stroke, MI and breast cancer. Discussed transdermal use and using the lowest dose needed -Information on HRT was given -She will try OTC agents for now, estroven and estroven pm  2. Low libido Reading information given - Testos,Total,Free and SHBG (Female) -Discussed the option of topical, compounded testosterone cream. She would like to try it. Reviewed risks and  need for close f/u. Once her labs return will call in the testosterone for her, then she needs to return for f/u lab work one month later.   Over 30 minutes in total patient care.  - Testos,Total,Free and SHBG (Female); Future

## 2022-04-26 ENCOUNTER — Telehealth: Payer: Self-pay | Admitting: *Deleted

## 2022-04-26 DIAGNOSIS — R6882 Decreased libido: Secondary | ICD-10-CM

## 2022-04-26 LAB — TESTOS,TOTAL,FREE AND SHBG (FEMALE)
Free Testosterone: 1.1 pg/mL (ref 0.1–6.4)
Sex Hormone Binding: 55 nmol/L (ref 17–124)
Testosterone, Total, LC-MS-MS: 10 ng/dL (ref 2–45)

## 2022-04-26 LAB — FOLLICLE STIMULATING HORMONE: FSH: 41.5 m[IU]/mL

## 2022-04-26 NOTE — Telephone Encounter (Signed)
Patient called requesting testosterone cream called into [facility]. Patient said you told her to call if she wants Rx. Please advise

## 2022-04-28 ENCOUNTER — Other Ambulatory Visit: Payer: Self-pay | Admitting: *Deleted

## 2022-04-28 MED ORDER — NONFORMULARY OR COMPOUNDED ITEM: 0 refills | Status: DC

## 2022-04-28 NOTE — Telephone Encounter (Signed)
See lab results.  

## 2022-05-26 ENCOUNTER — Other Ambulatory Visit: Payer: BC Managed Care – PPO

## 2022-06-08 ENCOUNTER — Other Ambulatory Visit: Payer: BC Managed Care – PPO

## 2022-06-08 DIAGNOSIS — R6882 Decreased libido: Secondary | ICD-10-CM

## 2022-06-13 ENCOUNTER — Other Ambulatory Visit: Payer: Self-pay

## 2022-06-13 LAB — TESTOS,TOTAL,FREE AND SHBG (FEMALE)
Free Testosterone: 7.2 pg/mL — ABNORMAL HIGH (ref 0.1–6.4)
Sex Hormone Binding: 57 nmol/L (ref 17–124)
Testosterone, Total, LC-MS-MS: 96 ng/dL — ABNORMAL HIGH (ref 2–45)

## 2022-06-21 NOTE — Progress Notes (Signed)
46 y.o. G0P0000 Significant Other Black or African American Not Hispanic or Latino female here for annual exam. She is s/p a total laparoscopic hysterectomy, LSO, RS and extensive LOA in 10/20.  She was seen in 6/23 c/o vasomotor symptoms and low libido. Her FSH was 41.5 and her Free testosterone was 1.1. She was started on topical testosterone, at her 1 month f/u blood work, her levels were too high and she was instructed to 1/2 her dose. She doesn't really think it's helping anyway.   At the moment her vasomotor symptoms are tolerable. Not having night sweats.   No bladder c/o.     Patient's last menstrual period was 07/11/2019.          Sexually active: Yes.    The current method of family planning is status post hysterectomy.    Exercising: Yes.     Yoga  Smoker:  no  Health Maintenance: Pap:  06/18/20 Negative, negative hpv;  12-08-17 ASCUS HPV HR +, negative colposcopy. 06-13-2019 neg HPV HR neg History of abnormal Pap:  yes MR Breast  04/18/22 Bi-rads 1 Neg  Mammogram in 11/29/21 BMD:   none  Colonoscopy: 07/21/20  TDaP:  unsure  Gardasil: none    reports that she has an unknown smoking status. She has never used smokeless tobacco. She reports that she does not currently use alcohol. She reports that she does not use drugs. 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  Past Medical History:  Diagnosis Date   Amenorrhea    Depression    Family history of breast cancer    Family history of colon cancer    Fibroid    Heart murmur    as a child   Menstrual cramps    Ovarian cyst     Past Surgical History:  Procedure Laterality Date   BREAST BIOPSY Right 08/09/2021   CYSTOSCOPY N/A 08/19/2019   Procedure: CYSTOSCOPY;  Surgeon: Salvadore Dom, MD;  Location: [facility];  Service: Gynecology;  Laterality: N/A;   LYSIS OF ADHESION N/A 08/19/2019   Procedure: LYSIS OF ADHESION;  Surgeon: Salvadore Dom, MD;  Location: [facility];   Service: Gynecology;  Laterality: N/A;   MYOMECTOMY  2012   SHOULDER ADHESION RELEASE Left 02/18/2021   Dr.Timothy Percell Miller   TOTAL LAPAROSCOPIC HYSTERECTOMY WITH SALPINGECTOMY Bilateral 08/19/2019   Procedure: TOTAL LAPAROSCOPIC HYSTERECTOMY WITH BILATERAL SALPINGECTOMY,LEFT OOPHORECTOMY, EXTENSIVE LYSIS OF ADHESIONS;  Surgeon: Salvadore Dom, MD;  Location: [place];  Service: Gynecology;  Laterality: Bilateral;    Current Outpatient Medications  Medication Sig Dispense Refill   cyclobenzaprine (FLEXERIL) 5 MG tablet Take 1 tablet (5 mg total) by mouth at bedtime as needed for muscle spasms. 30 tablet 0   fexofenadine (ALLEGRA ALLERGY) 180 MG tablet Take 180 mg by mouth daily.     MAGNESIUM PO Take 500 mg by mouth daily.     NONFORMULARY OR COMPOUNDED ITEM testosterone 1% cream  Rub 0.5 grams on to the lower abdomen daily 30 each 0   olopatadine (PATANOL) 0.1 % ophthalmic solution Place 1 drop into both eyes daily.     Olopatadine-Mometasone (RYALTRIS) [id number] MCG/ACT SUSP Place 1-2 sprays into the nose in the morning and at bedtime. 29 g 5   VITAMIN D PO Take 5,000 Units by mouth daily.     No current facility-administered medications for this visit.    Family History  Problem Relation Age of Onset   Fibroids Mother  Colon polyps Mother    Diabetes Father    Fibroids Sister    Colon cancer Maternal Grandmother    Lung cancer Maternal Grandmother    Breast cancer Maternal Grandmother        dx <50   Cancer Maternal Grandfather    Breast cancer Paternal Grandmother 29   Heart attack Paternal Grandmother    Breast cancer Paternal Aunt 100       recurrance at 46   Brain cancer Paternal Aunt    Obesity Maternal Aunt    Diabetes Maternal Aunt    Heart failure Maternal Aunt    Heart disease Paternal Uncle     Review of Systems  All other systems reviewed and are negative.   Exam:   LMP 07/11/2019   Weight change: '@WEIGHTCHANGE'$ @ Height:      Ht  Readings from Last 3 Encounters:  04/21/22 '5\' 3"'$  (1.6 m)  03/29/22 5' 3.25" (1.607 m)  07/15/21 5' 3.5" (1.613 m)    General appearance: alert, cooperative and appears stated age Head: Normocephalic, without obvious abnormality, atraumatic Neck: no adenopathy, supple, symmetrical, trachea midline and thyroid normal to inspection and palpation Lungs: clear to auscultation bilaterally Cardiovascular: regular rate and rhythm Breasts: normal appearance, no masses or tenderness Abdomen: soft, non-tender; non distended,  no masses,  no organomegaly Extremities: extremities normal, atraumatic, no cyanosis or edema Skin: Skin color, texture, turgor normal. No rashes or lesions Lymph nodes: Cervical, supraclavicular, and axillary nodes normal. No abnormal inguinal nodes palpated Neurologic: Grossly normal   Pelvic: External genitalia:  no lesions              Urethra:  normal appearing urethra with no masses, tenderness or lesions              Bartholins and Skenes: normal                 Vagina: normal appearing vagina with normal color and discharge, no lesions              Cervix: absent               Bimanual Exam:  Uterus:  uterus absent              Adnexa: no mass, fullness, tenderness               Rectovaginal: Confirms               Anus:  normal sphincter tone, no lesions  Gae Dry, CMA chaperoned for the exam.  1. Well woman exam Discussed breast self exam Discussed calcium and vit D intake Labs with primary Pap next year  2. Increased risk of breast cancer Mammogram and mri are utd We discussed the retention of MRI contrast and the possibility of spacing her MRI's out to every 2 years.

## 2022-06-30 ENCOUNTER — Encounter: Payer: Self-pay | Admitting: Obstetrics and Gynecology

## 2022-06-30 ENCOUNTER — Ambulatory Visit (INDEPENDENT_AMBULATORY_CARE_PROVIDER_SITE_OTHER): Payer: BC Managed Care – PPO | Admitting: Obstetrics and Gynecology

## 2022-06-30 VITALS — BP 106/72 | HR 77 | Ht 63.0 in | Wt 152.0 lb

## 2022-06-30 DIAGNOSIS — Z01419 Encounter for gynecological examination (general) (routine) without abnormal findings: Secondary | ICD-10-CM | POA: Diagnosis not present

## 2022-06-30 DIAGNOSIS — R194 Change in bowel habit: Secondary | ICD-10-CM | POA: Insufficient documentation

## 2022-06-30 DIAGNOSIS — Z9189 Other specified personal risk factors, not elsewhere classified: Secondary | ICD-10-CM | POA: Diagnosis not present

## 2022-06-30 DIAGNOSIS — R141 Gas pain: Secondary | ICD-10-CM | POA: Insufficient documentation

## 2022-06-30 NOTE — Patient Instructions (Signed)

## 2022-08-02 ENCOUNTER — Ambulatory Visit: Payer: BC Managed Care – PPO | Admitting: Allergy

## 2022-08-03 ENCOUNTER — Other Ambulatory Visit: Payer: BC Managed Care – PPO

## 2023-01-03 ENCOUNTER — Other Ambulatory Visit: Payer: Self-pay | Admitting: Obstetrics and Gynecology

## 2023-01-03 DIAGNOSIS — Z1231 Encounter for screening mammogram for malignant neoplasm of breast: Secondary | ICD-10-CM

## 2023-01-05 ENCOUNTER — Encounter: Payer: Self-pay | Admitting: Obstetrics and Gynecology

## 2023-01-05 ENCOUNTER — Ambulatory Visit: Payer: BC Managed Care – PPO | Admitting: Obstetrics and Gynecology

## 2023-01-05 VITALS — BP 110/68 | HR 95 | Ht 63.0 in | Wt 151.0 lb

## 2023-01-05 DIAGNOSIS — N62 Hypertrophy of breast: Secondary | ICD-10-CM | POA: Diagnosis not present

## 2023-01-05 DIAGNOSIS — Z0000 Encounter for general adult medical examination without abnormal findings: Secondary | ICD-10-CM

## 2023-01-05 NOTE — Progress Notes (Signed)
GYNECOLOGY  VISIT   HPI: 47 y.o.   Married Black or [place] American Not Hispanic or Latino  female   G0P0000 with Patient's last menstrual period was 07/11/2019.   here for breast heaviness. Patient states that when she was going to have her Mammogram she told the tech that she had some breast heaviness and that she had been wearing a sports bra to bed. Patient feels that the heaviness was hormonal. She states that she is no longer having the feeling.  She has an elevated risk of breast cancer, TC risk is 23.5%. Last breast MRI was in 6/23, we decided to do her breast MRI's every 2 years.   GYNECOLOGIC HISTORY: Patient's last menstrual period was 07/11/2019. Contraception: hysterectomy  Menopausal hormone therapy: none         OB History     Gravida  0   Para  0   Term  0   Preterm  0   AB  0   Living  0      SAB  0   IAB  0   Ectopic  0   Multiple  0   Live Births  0              Patient Active Problem List   Diagnosis Date Noted   Change in bowel habit 06/30/2022   Flatulence, eructation and gas pain 06/30/2022   Other allergic rhinitis 03/29/2022   Allergic conjunctivitis of both eyes 03/29/2022   Cough 03/29/2022   Other adverse food reactions, not elsewhere classified, subsequent encounter 03/29/2022   Cervical radiculopathy 09/10/2020   Neutropenia ([place]) 05/25/2020   Digestive problems 11/08/2019   Status post laparoscopic hysterectomy 08/19/2019   Genetic testing 08/12/2019   Family history of breast cancer    Family history of colon cancer     Past Medical History:  Diagnosis Date   Amenorrhea    Depression    Family history of breast cancer    Family history of colon cancer    Fibroid    Heart murmur    as a child   Menstrual cramps    Ovarian cyst     Past Surgical History:  Procedure Laterality Date   BREAST BIOPSY Right 08/09/2021   CYSTOSCOPY N/A 08/19/2019   Procedure: CYSTOSCOPY;  Surgeon: Salvadore Dom, MD;   Location: [place];  Service: Gynecology;  Laterality: N/A;   LYSIS OF ADHESION N/A 08/19/2019   Procedure: LYSIS OF ADHESION;  Surgeon: Salvadore Dom, MD;  Location: [facility];  Service: Gynecology;  Laterality: N/A;   MYOMECTOMY  2012   SHOULDER ADHESION RELEASE Left 02/18/2021   Dr.Timothy Percell Miller   TOTAL LAPAROSCOPIC HYSTERECTOMY WITH SALPINGECTOMY Bilateral 08/19/2019   Procedure: TOTAL LAPAROSCOPIC HYSTERECTOMY WITH BILATERAL SALPINGECTOMY,LEFT OOPHORECTOMY, EXTENSIVE LYSIS OF ADHESIONS;  Surgeon: Salvadore Dom, MD;  Location: [place];  Service: Gynecology;  Laterality: Bilateral;    Current Outpatient Medications  Medication Sig Dispense Refill   B Complex Vitamins (B COMPLEX PO) Take by mouth.     fexofenadine (ALLEGRA ALLERGY) 180 MG tablet Take 180 mg by mouth daily.     MAGNESIUM PO Take 500 mg by mouth daily.     olopatadine (PATANOL) 0.1 % ophthalmic solution Place 1 drop into both eyes daily.     Olopatadine-Mometasone (RYALTRIS) [id number] MCG/ACT SUSP Place 1-2 sprays into the nose in the morning and at bedtime. 29 g 5   Omega-3 Fatty Acids (OMEGA-3 PO)  Take by mouth.     VITAMIN D PO Take 5,000 Units by mouth daily.     No current facility-administered medications for this visit.     ALLERGIES: Flagyl [metronidazole] and Shellfish allergy  Family History  Problem Relation Age of Onset   Fibroids Mother    Colon polyps Mother    Diabetes Father    Fibroids Sister    Colon cancer Maternal Grandmother    Lung cancer Maternal Grandmother    Breast cancer Maternal Grandmother        dx <50   Cancer Maternal Grandfather    Breast cancer Paternal Grandmother 95   Heart attack Paternal Grandmother    Breast cancer Paternal Aunt 6       recurrance at 33   Brain cancer Paternal Aunt    Obesity Maternal Aunt    Diabetes Maternal Aunt    Heart failure Maternal Aunt    Heart disease Paternal Uncle      Social History   Socioeconomic History   Marital status: Significant Other    Spouse name: Not on file   Number of children: Not on file   Years of education: Not on file   Highest education level: Not on file  Occupational History   Not on file  Tobacco Use   Smoking status: Unknown   Smokeless tobacco: Never  Vaping Use   Vaping Use: Never used  Substance and Sexual Activity   Alcohol use: Not Currently   Drug use: No   Sexual activity: Not Currently    Birth control/protection: None    Comment: female partner  Other Topics Concern   Not on file  Social History Narrative   Not on file   Social Determinants of Health   Financial Resource Strain: Not on file  Food Insecurity: Not on file  Transportation Needs: Not on file  Physical Activity: Not on file  Stress: Not on file  Social Connections: Not on file  Intimate Partner Violence: Not on file    Review of Systems  All other systems reviewed and are negative.   PHYSICAL EXAMINATION:    BP 110/68   Pulse 95   Ht '5\' 3"'$  (1.6 m)   Wt 151 lb (68.5 kg)   LMP 07/11/2019   SpO2 100%   BMI 26.75 kg/m     General appearance: alert, cooperative and appears stated age Breasts: normal appearance, no masses or tenderness No supraclavicular or axillary adenopathy  1. Normal breast exam She will schedule her mammogram F/U for annual exam in 8/23

## 2023-01-19 ENCOUNTER — Ambulatory Visit
Admission: RE | Admit: 2023-01-19 | Discharge: 2023-01-19 | Disposition: A | Discharge status: Home / Self Care | Payer: BC Managed Care – PPO | Source: Ambulatory Visit | Attending: Obstetrics and Gynecology | Admitting: Obstetrics and Gynecology

## 2023-01-19 DIAGNOSIS — Z1231 Encounter for screening mammogram for malignant neoplasm of breast: Secondary | ICD-10-CM

## 2023-01-23 ENCOUNTER — Other Ambulatory Visit: Payer: Self-pay | Admitting: Obstetrics and Gynecology

## 2023-01-23 DIAGNOSIS — R928 Other abnormal and inconclusive findings on diagnostic imaging of breast: Secondary | ICD-10-CM

## 2023-01-28 ENCOUNTER — Ambulatory Visit
Admission: RE | Admit: 2023-01-28 | Discharge: 2023-01-28 | Disposition: A | Discharge status: Home / Self Care | Payer: BC Managed Care – PPO | Source: Ambulatory Visit | Attending: Obstetrics and Gynecology | Admitting: Obstetrics and Gynecology

## 2023-01-28 ENCOUNTER — Other Ambulatory Visit: Payer: Self-pay | Admitting: Obstetrics and Gynecology

## 2023-01-28 DIAGNOSIS — N6320 Unspecified lump in the left breast, unspecified quadrant: Secondary | ICD-10-CM

## 2023-01-28 DIAGNOSIS — R928 Other abnormal and inconclusive findings on diagnostic imaging of breast: Secondary | ICD-10-CM

## 2023-02-03 ENCOUNTER — Ambulatory Visit
Admission: RE | Admit: 2023-02-03 | Discharge: 2023-02-03 | Disposition: A | Discharge status: Home / Self Care | Payer: BC Managed Care – PPO | Source: Ambulatory Visit | Attending: Obstetrics and Gynecology | Admitting: Obstetrics and Gynecology

## 2023-02-03 DIAGNOSIS — N6320 Unspecified lump in the left breast, unspecified quadrant: Secondary | ICD-10-CM

## 2023-02-03 DIAGNOSIS — R928 Other abnormal and inconclusive findings on diagnostic imaging of breast: Secondary | ICD-10-CM

## 2023-02-03 HISTORY — PX: BREAST BIOPSY: SHX20

## 2023-02-09 DIAGNOSIS — K580 Irritable bowel syndrome with diarrhea: Secondary | ICD-10-CM | POA: Insufficient documentation

## 2023-05-30 NOTE — Progress Notes (Signed)
 Subjective   Patient ID:  Meredith Lee is a 47 y.o. (DOB 1976/06/07) female.     Patient presents with  . Annual Exam     Problem List, Past Medical History, Past Surgical History, Past Family History, Social History, Medications, and Allergies, were reviewed and updated as appropriate.   Patient Active Problem List   Diagnosis Date Noted  . Family history of breast cancer 02-20-23    BrCA negative per patient   . Family history of colon cancer 20-Feb-2023    Maternal GM died 68. Colonoscopy @ 45 GI Dr Dianna   . Irritable bowel syndrome with diarrhea February 20, 2023  . Environmental allergies 03/29/2022    Last Assessment & Plan:   Formatting of this note might be different from the original.  Rhinoconjunctivitis symptoms for the past 3 months.  Using Allegra and olopatadine as needed with good benefit.  No prior allergy /ENT evaluation.  1 dog at home.   Today's skin testing showed: positive to trees, grass, ragweed, weed, dust mites, cat.    Start environmental control measures as below.   Use over the counter antihistamines such as Zyrtec (cetirizine), Claritin (loratadine), Allegra (fexofenadine), or Xyzal (levocetirizine) daily as needed. May take twice a day during allergy  flares. May switch antihistamines every few months.   Use olopatadine eye drops 0.1% twice a day as needed for itchy/watery eyes.   Start Ryaltris  (olopatadine + mometasone nasal spray combination) 1-2 sprays per nostril twice a day. Sample given.   If this works well for you then have the pharmacy send you the prescription.    Consider allergy  injections for long term control if above medications do not help the symptoms - handout given.   . Neutropenia (*) 05/25/2020    Last Assessment & Plan:   Formatting of this note is different from the original.  Leucopenia especially Neutropenia:   Lab review:      07/12/18: WBC 3.6  08/20/19: WBC 7.5  04/23/20: WBC 2, ANC 0.8  05/08/20: WBC 3.3, ANC 1   07/01/2021: WBC 3, hemoglobin 14.1    Previous Workup:  ANA: Positive1: 320 speckled pattern  I informed her that having a positive ANA does not automatically mean that she has lupus.  If she develops any other symptoms of lupus then she needs to see a rheumatologist.  I discussed with her the symptoms of lupus.   Selenium zinc  and copper  levels: Normal   Hepatitis B, hepatitis C and HIV: Normal     If her neutrophil count drops below 0.5 then we will consider doing a bone marrow biopsy.  No significant change in her white blood cell count although ANC was not available on the most recent blood work.  I recommend continued watchful monitoring.    Return to clinic in 1 year for follow-up   . Status post laparoscopic hysterectomy 08/19/2019   Allergies  Allergen Reactions  . Metronidazole Anaphylaxis  . Shellfish Allergy  Diarrhea    History reviewed. No pertinent past medical history. Past Surgical History:  Procedure Laterality Date  . Hysterectomy    . Myomectomy    . Shoulder surgery Left    Social History   Socioeconomic History  . Marital status: Married  Tobacco Use  . Smoking status: Former    Packs/day: 0.25    Years: 2.00    Additional pack years: 0.00    Total pack years: 0.50    Types: Cigarettes  . Smokeless tobacco: Never  Substance and Sexual Activity  .  Alcohol use: Yes    Comment: Occ  . Drug use: Not Currently  . Sexual activity: Yes    Partners: Female   Family History  Problem Relation Age of Onset  . Hyperlipidemia Mother   . Diabetes Father   . Heart failure Father   . Kidney failure Father   . Diabetes Sister   . Heart disease Maternal Aunt   . Heart attack Paternal Uncle   . Colon cancer Maternal Grandmother   . Lung cancer Maternal Grandmother   . Breast cancer Maternal Grandmother   . Diabetes Paternal Grandmother   . Heart attack Paternal Grandmother   . Heart attack Paternal Grandfather    Immunization History  Administered  Date(s) Administered  . Pfizer-BioNTech COVID-19 Monovalent Vaccine mRNA 03/26/2020, 04/21/2020, 10/15/2020  . Tdap 02/09/2023    Review of Systems - History obtained from the patient General ROS: negative for - chills, fatigue, fever, sleep disturbance, weight gain or weight loss Psychological ROS: negative for - anxiety, concentration difficulties, depression, hallucinations, memory difficulties or sleep disturbances Ophthalmic ROS: negative for - eye pain or loss of vision ENT ROS: negative for - epistaxis, headaches, hearing change, nasal congestion or sore throat Hematological and Lymphatic ROS: negative for - bleeding problems, bruising, jaundice, night sweats or weight loss Endocrine ROS: negative for - breast changes, galactorrhea, palpitations or temperature intolerance Breast ROS: negative for breast lumps Respiratory ROS: no cough, shortness of breath, or wheezing Cardiovascular ROS: no chest pain or dyspnea on exertion Gastrointestinal ROS: no abdominal pain, change in bowel habits, or black or bloody stools Genito-Urinary ROS: no dysuria, trouble voiding, or hematuria Musculoskeletal ROS: negative for - joint pain, joint swelling or muscular weakness Neurological ROS: negative for - confusion, dizziness, gait disturbance, headaches, numbness/tingling, speech problems or weakness Dermatological ROS: negative for dry skin, eczema, hair changes, mole changes and rash   Objective   BP 102/62 (BP Location: Left arm, Patient Position: Sitting)   Pulse 72   Temp 97.7 F (36.5 C) (Temporal)   Resp 12   Ht 5' 3 (1.6 m)   Wt 152 lb (68.9 kg)   LMP  (LMP Unknown)   SpO2 98%   Breastfeeding No   BMI 26.93 kg/m   General:  Well developed, well nourished, no distress Head:  Normocephalic, atraumatic Eyes:  Pupils equal, round, reactive to light and accommodation, conjunctiva normal Ears:   Bilateral external auditory canals normal, right tympanic membrane normal, left tympanic  membrane normal Neck:  Supple with normal range of motion, no lymphadenopathy  Cardiovascular:  S1S2, regular, rate, and rhythm, no murmurs , no gallops and no rubs Lungs:  Clear to auscultation with normal effort, no wheezes, rales or rhonchi Abdomen:  Bowel sounds active, soft, non-tender, non-distended, no hepatosplenomegaly or masses Skin:  No focal rashes  Extremities:  No clubbing, no cyanosis, no edema, Normal distal vascular compromise Neurologic:  No focal deficits, Cranial Nerves II-XII grossly intact     No results found.  PHQ9       02/09/2023  Depression Screen  Please choose the category that best describes the patient's current state 0  Not eligible on the basis of Not applicable  1. Little interest or pleasure in doing things 0  2. Feeling down, depressed, or hopeless 0  PHQ-2 Total Score 0  PHQ-2 Interpretation of Score for Depression (Payor) Negative    No results found.  BMI Screening and Intervention   Suggested Diagnosis based on BMI:  Referrals:               Impression   1. Routine general medical examination at a health care facility   2. Screening for thyroid  disorder   3. Screening for lipoid disorders   4. Vitamin D  deficiency     Health Maintenance  Topic Date Due  . Mammogram  02/03/2024  . Adult Wellness Exam  06/30/2025  . Zoster Vaccine (1 of 2) 06/29/2028  . DTaP/Tdap/Td Vaccines (2 - Td or Tdap) 02/08/2033  . COVID-19 Vaccine  Completed  . Pneumococcal Vaccine: Pediatrics and At Risk Patients  Aged Out  . Hepatitis A Vaccine  Aged Out  . Meningococcal Conjugate Vaccine  Aged Out  . HPV Vaccine  Aged Out     Plan   Orders Placed This Encounter  Procedures  . CMP14+CBC/D/Plt+TSH  . Lipid Panel + Non-HDL Cholesterol  . Vitamin D ,  25-Hydroxy    Note: parts of this documented have been generated using voice recognition software. There may be unintended transcription errors that were not detected upon document  review  Follow up in about 1 year (around 05/29/2024) for Annual Physical.    Patient's Medications       * Accurate as of May 30, 2023  4:29 PM. Reflects encounter med changes as of last refresh          Continued Medications      Instructions  B COMPLEX 1 PO  Oral   fexofenadine 180 mg tablet Commonly known as: ALLEGRA  180 mg, Oral, Daily   fish oil 1000 mg Caps Commonly known as: SEA-OMEGA  Oral   MAGNESIUM PO  500 mg, Oral, Daily   olopatadine HCl 0.1% ophthalmic solution Commonly known as: PATANOL  1 drop, Ophthalmic, Daily   RYALTRIS  665-25 MCG/ACT Susp Generic drug: Olopatadine-Mometasone  1-2 sprays, Nasal, 2 times a day   triamcinolone acetonide 0.1% cream Commonly known as: KENALOG  Topical, 2 times a day   vitamin D3 125 mcg (5000 UT) Caps capsule  5,000 Units, Oral, Daily         Health maintenance issues including appropriate cancer screening, healthy diet, exercise and tobacco avoidance were discussed with the patient. Sunblock spf 30 or above for skin cancer prevention and twice yearly dental exams discussed with the patient.  Risks, benefits, and alternatives of the medications and treatment plan prescribed today were discussed, and patient expressed understanding. Plan follow-up as discussed or as needed if any worsening symptoms or change in condition.    *Some images could not be shown.

## 2023-06-23 NOTE — Progress Notes (Signed)
47 y.o. G0P0000 Married [place] American female here for annual exam.    Not having hot flashes and night sweats.   Has low libido.  Tried testosterone and had high levels.  This was discontinued.  Vaginal dryness and discomfort with penetration.   Married 1 year ago.   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   PCP:   Harvie Heck, MD - [facility]  Patient's last menstrual period was 07/11/2019.           Sexually active: Yes. Female partner.  The current method of family planning is status post hysterectomy.    Exercising: Yes.     Light weight lifting, yoga Smoker:  no  Health Maintenance: Pap:  06/18/20 neg: HR HPV neg, 06/13/19 neg: HR HPV neg History of abnormal Pap:  yes, 12/08/17 colpo MMG:  01/28/23 Breast Density Cat C, BI-RADS CAT 4 sus.  Left breast bx - benign adenosis and sclerosis fibrosis.  Next mammogram due in March 2025.  MRI 04/18/22.   Colonoscopy:  07/21/20 - Dr. Bosie Clos.  Due in 10 years.  BMD:   n/a  Result  n/a TDaP:  02/09/23 Gardasil:   no HIV: 05/25/20 NR Hep C: 05/25/20 NR Screening Labs:  PCP   reports that she has an unknown smoking status. She has never used smokeless tobacco. She reports that she does not currently use alcohol. She reports that she does not use drugs.  Past Medical History:  Diagnosis Date   Amenorrhea    Depression    Family history of breast cancer    Family history of colon cancer    Fibroid    Heart murmur    as a child   Menstrual cramps    Ovarian cyst     Past Surgical History:  Procedure Laterality Date   BREAST BIOPSY Right 08/09/2021   BREAST BIOPSY Left 02/03/2023   [place] LT BREAST BX W LOC DEV 1ST LESION IMG BX SPEC US GUIDE 02/03/2023 [facility]MO[facility]   CYSTOSCOPY N/A 08/19/2019   Procedure: CYSTOSCOPY;  Surgeon: Romualdo Bolk, MD;  Location: [facility];  Service: Gynecology;  Laterality: N/A;   LYSIS OF ADHESION N/A 08/19/2019   Procedure: LYSIS OF ADHESION;  Surgeon: Romualdo Bolk, MD;   Location: [facility];  Service: Gynecology;  Laterality: N/A;   MYOMECTOMY  2012   SHOULDER ADHESION RELEASE Left 02/18/2021   Dr.Timothy Eulah Pont   TOTAL LAPAROSCOPIC HYSTERECTOMY WITH SALPINGECTOMY Bilateral 08/19/2019   Procedure: TOTAL LAPAROSCOPIC HYSTERECTOMY WITH BILATERAL SALPINGECTOMY,LEFT OOPHORECTOMY, EXTENSIVE LYSIS OF ADHESIONS;  Surgeon: Romualdo Bolk, MD;  Location: [facility] [place];  Service: Gynecology;  Laterality: Bilateral;    Current Outpatient Medications  Medication Sig Dispense Refill   B Complex Vitamins (B COMPLEX PO) Take by mouth.     fexofenadine (ALLEGRA ALLERGY) 180 MG tablet Take 180 mg by mouth daily.     MAGNESIUM PO Take 500 mg by mouth daily.     olopatadine (PATANOL) 0.1 % ophthalmic solution Place 1 drop into both eyes daily.     Olopatadine-Mometasone (RYALTRIS) [id number] MCG/ACT SUSP Place 1-2 sprays into the nose in the morning and at bedtime. 29 g 5   Omega-3 Fatty Acids (OMEGA-3 PO) Take by mouth.     VITAMIN D PO Take 5,000 Units by mouth daily.     No current facility-administered medications for this visit.    Family History  Problem Relation Age of Onset   Fibroids Mother    Colon  polyps Mother    Diabetes Father    Fibroids Sister    Diabetes Sister    Colon cancer Maternal Grandmother    Lung cancer Maternal Grandmother    Breast cancer Maternal Grandmother        dx <50   Cancer Maternal Grandfather    Breast cancer Paternal Grandmother 52   Heart attack Paternal Grandmother    Obesity Maternal Aunt    Diabetes Maternal Aunt    Heart failure Maternal Aunt    Breast cancer Paternal Aunt 55       recurrance at 13   Brain cancer Paternal Aunt    Heart disease Paternal Uncle     Review of Systems  All other systems reviewed and are negative.   Exam:   BP 118/82 (BP Location: Right Arm, Patient Position: Sitting, Cuff Size: Normal)   Pulse 75   Ht 5\' 4"  (1.626 m)   Wt 154 lb (69.9 kg)   LMP  07/11/2019   SpO2 98%   BMI 26.43 kg/m     General appearance: alert, cooperative and appears stated age Head: normocephalic, without obvious abnormality, atraumatic Neck: no adenopathy, supple, symmetrical, trachea midline and thyroid normal to inspection and palpation Lungs: clear to auscultation bilaterally Breasts: normal appearance, no masses or tenderness, No nipple retraction or dimpling, No nipple discharge or bleeding, No axillary adenopathy Heart: regular rate and rhythm Abdomen: soft, non-tender; no masses, no organomegaly Extremities: extremities normal, atraumatic, no cyanosis or edema Skin: skin color, texture, turgor normal. No rashes or lesions Lymph nodes: cervical, supraclavicular, and axillary nodes normal. Neurologic: grossly normal  Pelvic: External genitalia:  no lesions              No abnormal inguinal nodes palpated.              Urethra:  normal appearing urethra with no masses, tenderness or lesions              Bartholins and Skenes: normal                 Vagina: normal appearing vagina with normal color and discharge, no lesions              Cervix: absent              Pap taken: no Bimanual Exam:  Uterus:  absent              Adnexa: no mass, fullness, tenderness              Rectal exam: yes.  Confirms.              Anus:  normal sphincter tone, no lesions  Chaperone was present for exam:  Warren Lacy, CMA  Assessment:   Well woman visit with gynecologic exam. Status post total laparoscopic hysterectomy, bilateral salpingectomy, left oophorectomy, LOA, 2020.   Hx fibroids, adenomyosis, endometriosis.  Benign left ovary. Increased risk of breast cancer, 23.5%.  Doing MRI of breast every 2 years.  Decreased libido. Vaginal atrophy.   Plan: Mammogram screening discussed. Self breast awareness reviewed. Pap and HR HPV  not indicated.  Guidelines for Calcium, Vitamin D, regular exercise program including cardiovascular and weight bearing exercise. We  discussed Wellbutrin XL for treating decreased libido.  We reviewed risks and benefits. She will let me know if she would like to start this medication.  We reviewed [facility] counseling also.   Start Vagifem estradiol tablets for vaginal atrophy.  Instructed  in use.  Breast MRI in September, 2025.  Follow up annually and prn.   After visit summary provided.

## 2023-07-03 ENCOUNTER — Ambulatory Visit (INDEPENDENT_AMBULATORY_CARE_PROVIDER_SITE_OTHER): Payer: BC Managed Care – PPO | Admitting: Obstetrics and Gynecology

## 2023-07-03 ENCOUNTER — Encounter: Payer: Self-pay | Admitting: Obstetrics and Gynecology

## 2023-07-03 VITALS — BP 118/82 | HR 75 | Ht 64.0 in | Wt 154.0 lb

## 2023-07-03 DIAGNOSIS — Z01419 Encounter for gynecological examination (general) (routine) without abnormal findings: Secondary | ICD-10-CM | POA: Diagnosis not present

## 2023-07-03 MED ORDER — ESTRADIOL 10 MCG VA TABS: 1.0000 | ORAL_TABLET | VAGINAL | 3 refills | Status: DC

## 2023-07-03 NOTE — Patient Instructions (Signed)
Bupropion Tablets (Depression/Mood Disorders) What is this medication? BUPROPION (byoo PROE pee on) treats depression. It increases norepinephrine and dopamine in the brain, hormones that help regulate mood. It belongs to a group of medications called NDRIs. This medicine may be used for other purposes; ask your health care provider or pharmacist if you have questions. COMMON BRAND NAME(S): Wellbutrin What should I tell my care team before I take this medication? They need to know if you have any of these conditions: An eating disorder, such as anorexia or bulimia Bipolar disorder or psychosis Diabetes or high blood sugar, treated with medication Glaucoma Head injury or brain tumor Heart disease, previous heart attack, or irregular heart beat High blood pressure Kidney disease Liver disease Seizures Suicidal thoughts, plans, or attempt by you or a family member Tourette syndrome Weight loss An unusual or allergic reaction to bupropion, other medications, foods, dyes, or preservatives Pregnant or trying to become pregnant Breastfeeding How should I use this medication? Take this medication by mouth with a glass of water. Follow the directions on the prescription label. You can take it with or without food. If it upsets your stomach, take it with food. Take your medication at regular intervals. Do not take your medication more often than directed. Do not stop taking this medication suddenly except upon the advice of your care team. Stopping this medication too quickly may cause serious side effects or your condition may worsen. A special MedGuide will be given to you by the pharmacist with each prescription and refill. Be sure to read this information carefully each time. Talk to your care team regarding the use of this medication in children. Special care may be needed. Overdosage: If you think you have taken too much of this medicine contact a poison control center or emergency room at  once. NOTE: This medicine is only for you. Do not share this medicine with others. What if I miss a dose? If you miss a dose, take it as soon as you can. If it is less than four hours to your next dose, take only that dose and skip the missed dose. Do not take double or extra doses. What may interact with this medication? Do not take this medication with any of the following: Linezolid MAOIs, such as Azilect, Carbex, Eldepryl, Marplan, Nardil, and Parnate Methylene blue (injected into a vein) Other medications that contain bupropion, such as Zyban This medication may also interact with the following: Alcohol Certain medications for anxiety or sleep Certain medications for blood pressure, such as metoprolol, propranolol Certain medications for HIV or AIDS, such as efavirenz, lopinavir, nelfinavir, ritonavir Certain medications for irregular heartbeat, such as propafenone, flecainide Certain medications for mental health conditions Certain medications for Parkinson disease, such as amantadine, levodopa Certain medications for seizures, such as carbamazepine, phenytoin, phenobarbital Cimetidine Clopidogrel Cyclophosphamide Digoxin Furazolidone Isoniazid Nicotine Orphenadrine Procarbazine Steroid medications, such as prednisone or cortisone Stimulant medications for attention disorders, weight loss, or to stay awake Tamoxifen Theophylline Thiotepa Ticlopidine Tramadol Warfarin This list may not describe all possible interactions. Give your health care provider a list of all the medicines, herbs, non-prescription drugs, or dietary supplements you use. Also tell them if you smoke, drink alcohol, or use illegal drugs. Some items may interact with your medicine. What should I watch for while using this medication? Tell your care team if your symptoms do not get better or if they get worse. Visit your care team for regular checks on your progress. Because it may take several  weeks to see  the full effects of this medication, it is important to continue your treatment as prescribed. This medication may cause thoughts of suicide or depression. This includes sudden changes in mood, behaviors, or thoughts. These changes can happen at any time but are more common in the beginning of treatment or after a change in dose. Call your care team right away if you experience these thoughts or worsening depression. This medication may cause mood and behavior changes, such as anxiety, nervousness, irritability, hostility, restlessness, excitability, hyperactivity, or trouble sleeping. These changes can happen at any time but are more common in the beginning of treatment or after a change in dose. Call your care team right away if you notice any of these symptoms. This medication may cause serious skin reactions. They can happen weeks to months after starting the medication. Contact your care team right away if you notice fevers or flu-like symptoms with a rash. The rash may be red or purple and then turn into blisters or peeling of the skin. You may also notice a red rash with swelling of the face, lips, or lymph nodes in your neck or under your arms. Avoid drinks that contain alcohol while taking this medication. Drinking large amounts of alcohol, using sleeping or anxiety medications, or quickly stopping the use of these agents while taking this medication may increase your risk for a seizure. This medication may affect your coordination, reaction time, or judgment. Do not drive or operate machinery until you know how this medication affects you. Do not take this medication close to bedtime. It may prevent you from sleeping. Your mouth may get dry. Chewing sugarless gum or sucking hard candy and drinking plenty of water may help. Contact your care team if the problem does not go away or is severe. What side effects may I notice from receiving this medication? Side effects that you should report to your  care team as soon as possible: Allergic reactions--skin rash, itching, hives, swelling of the face, lips, tongue, or throat Increase in blood pressure Mood and behavior changes--anxiety, nervousness, confusion, hallucinations, irritability, hostility, thoughts of suicide or self-harm, worsening mood, feelings of depression Redness, blistering, peeling, or loosening of the skin, including inside the mouth Seizures Sudden eye pain or change in vision such as blurry vision, seeing halos around lights, vision loss Side effects that usually do not require medical attention (report to your care team if they continue or are bothersome): Constipation Dizziness Dry mouth Loss of appetite Nausea Tremors or shaking Trouble sleeping This list may not describe all possible side effects. Call your doctor for medical advice about side effects. You may report side effects to FDA at 1-800-FDA-1088. Where should I keep my medication? Keep out of the reach of children and pets. Store at room temperature between 20 and 25 degrees C (68 and 77 degrees F), away from direct sunlight and moisture. Keep tightly closed. Throw away any unused medication after the expiration date. NOTE: This sheet is a summary. It may not cover all possible information. If you have questions about this medicine, talk to your doctor, pharmacist, or health care provider.  2024 Elsevier/Gold Standard (2022-07-17 00:00:00)   EXERCISE AND DIET:  We recommended that you start or continue a regular exercise program for good health. Regular exercise means any activity that makes your heart beat faster and makes you sweat.  We recommend exercising at least 30 minutes per day at least 3 days a week, preferably 4 or 5.  We also recommend a diet low in fat and sugar.  Inactivity, poor dietary choices and obesity can cause diabetes, heart attack, stroke, and kidney damage, among others.    ALCOHOL AND SMOKING:  Women should limit their alcohol  intake to no more than 7 drinks/beers/glasses of wine (combined, not each!) per week. Moderation of alcohol intake to this level decreases your risk of breast cancer and liver damage. And of course, no recreational drugs are part of a healthy lifestyle.  And absolutely no smoking or even second hand smoke. Most people know smoking can cause heart and lung diseases, but did you know it also contributes to weakening of your bones? Aging of your skin?  Yellowing of your teeth and nails?  CALCIUM AND VITAMIN D:  Adequate intake of calcium and Vitamin D are recommended.  The recommendations for exact amounts of these supplements seem to change often, but generally speaking 600 mg of calcium (either carbonate or citrate) and 800 units of Vitamin D per day seems prudent. Certain women may benefit from higher intake of Vitamin D.  If you are among these women, your doctor will have told you during your visit.    PAP SMEARS:  Pap smears, to check for cervical cancer or precancers,  have traditionally been done yearly, although recent scientific advances have shown that most women can have pap smears less often.  However, every woman still should have a physical exam from her gynecologist every year. It will include a breast check, inspection of the vulva and vagina to check for abnormal growths or skin changes, a visual exam of the cervix, and then an exam to evaluate the size and shape of the uterus and ovaries.  And after 45 years of age, a rectal exam is indicated to check for rectal cancers. We will also provide age appropriate advice regarding health maintenance, like when you should have certain vaccines, screening for sexually transmitted diseases, bone density testing, colonoscopy, mammograms, etc.   MAMMOGRAMS:  All women over 58 years old should have a yearly mammogram. Many facilities now offer a "3D" mammogram, which may cost around $50 extra out of pocket. If possible,  we recommend you accept the option  to have the 3D mammogram performed.  It both reduces the number of women who will be called back for extra views which then turn out to be normal, and it is better than the routine mammogram at detecting truly abnormal areas.    COLONOSCOPY:  Colonoscopy to screen for colon cancer is recommended for all women at age 2.  We know, you hate the idea of the prep.  We agree, BUT, having colon cancer and not knowing it is worse!!  Colon cancer so often starts as a polyp that can be seen and removed at colonscopy, which can quite literally save your life!  And if your first colonoscopy is normal and you have no family history of colon cancer, most women don't have to have it again for 10 years.  Once every ten years, you can do something that may end up saving your life, right?  We will be happy to help you get it scheduled when you are ready.  Be sure to check your insurance coverage so you understand how much it will cost.  It may be covered as a preventative service at no cost, but you should check your particular policy.

## 2024-01-04 ENCOUNTER — Other Ambulatory Visit: Payer: Self-pay | Admitting: Family Medicine

## 2024-01-04 DIAGNOSIS — Z1231 Encounter for screening mammogram for malignant neoplasm of breast: Secondary | ICD-10-CM

## 2024-01-25 ENCOUNTER — Ambulatory Visit
Admission: RE | Admit: 2024-01-25 | Discharge: 2024-01-25 | Disposition: A | Discharge status: Home / Self Care | Payer: Self-pay | Source: Ambulatory Visit | Attending: Family Medicine | Admitting: Family Medicine

## 2024-01-25 DIAGNOSIS — Z1231 Encounter for screening mammogram for malignant neoplasm of breast: Secondary | ICD-10-CM

## 2024-03-01 ENCOUNTER — Ambulatory Visit: Admitting: Family Medicine

## 2024-03-24 NOTE — Progress Notes (Signed)
 Follow Up Note  RE: Meredith Lee MRN: [id number] DOB: [birth year] Date of Office Visit: 03/25/2024  Referring provider: Nash Bade, MD Primary care provider: Nash Bade, MD  Chief Complaint: Allergic Rhinitis  (Takes zyrtec and rotates with allegra - issues with eyes ( itchy/watery- used valsaline around eyes and inside nasal for dryness)  and back of throat )  History of Present Illness: I had the pleasure of seeing Meredith Lee for a follow up visit at the [facility] on 03/25/2024. She is a 48 y.o. female, who is being followed for allergic rhinoconjunctivitis, adverse food reaction cough. Her previous allergy  office visit was on 03/29/2022 with Dr. Burdette Carolin. Today is a regular follow up visit.  Failed to follow-up as recommended.  Discussed the use of AI scribe software for clinical note transcription with the patient, who gave verbal consent to proceed.    She experiences flare-ups of her allergy  symptoms with the change of seasons, leading to runny, watery, and itchy eyes, an itchy nose, and an itchy throat. These symptoms have been persistent and bothersome, prompting her to apply Vaseline around her eyes and inside her nose for relief.  She has been taking Allegra daily as previously directed, but recently switched to a rotation of Zyrtec and Allegra due to the severity of her symptoms this season. She also uses a special pillowcase designed to help with allergies, which she believes has been beneficial.  She uses nasal sprays, specifically Flonase, but feels they have not been very effective. She has also tried saline rinses. For her eyes, she uses Equate Olopatadine eye drops as needed, which she finds effective.  She experiences a 'weird itchiness' on her lips, for which her primary care doctor prescribed a triamcinolone steroid cream.   She has a known allergy  to shellfish and avoids it, reporting no recent reactions.   She has a Public house manager at  home, which she does not allow to sleep with her to minimize allergy  exposure. She is aware that her dog may bring pollen indoors, which could exacerbate her symptoms.     Assessment and Plan: Alexzia is a 48 y.o. female with: Seasonal and perennial allergic rhinoconjunctivitis Past history - 1 dog at home. 2023 skin testing positive to trees, grass, ragweed, weed, dust mites, cat.  Interim history - symptoms not controlled. Interested in AIT. Continue environmental control measures as below. Use over the counter antihistamines such as Zyrtec (cetirizine), Claritin (loratadine), Allegra (fexofenadine), or Xyzal (levocetirizine) daily as needed. May take twice a day during allergy  flares. May switch antihistamines every few months. Use olopatadine eye drops 0.2% once a day as needed for itchy/watery eyes. Continue Ryaltris  (olopatadine + mometasone nasal spray combination) 1-2 sprays per nostril twice a day.  Nasal saline spray (i.e., Simply Saline) or nasal saline lavage (i.e., NeilMed) is recommended as needed and prior to medicated nasal sprays. Start Singulair  (montelukast ) 10mg  daily at night. Cautioned that in some children/adults can experience behavioral changes including hyperactivity, agitation, depression, sleep disturbances and suicidal ideations. These side effects are rare, but if you notice them you should notify me and discontinue Singulair  (montelukast ). Continue allergy  injections. 2 injections. Let us  know when ready to start.  Had a detailed discussion with patient/family that clinical history is suggestive of allergic rhinitis, and may benefit from allergy  immunotherapy (AIT). Discussed in detail regarding the dosing, schedule, side effects (mild to moderate local allergic reaction and rarely systemic allergic reactions including anaphylaxis), and benefits (significant  improvement in nasal symptoms, seasonal flares of asthma) of immunotherapy with the patient. There is significant  time commitment involved with allergy  shots, which includes weekly immunotherapy injections for first 9-12 months and then biweekly to monthly injections for 3-5 years. Consent was signed.  Other adverse food reactions, not elsewhere classified, subsequent encounter Past history - 1 episode of hives and subsequent diarrhea and vomiting after shellfish ingestion.  No issues with finned fish.  Skin testing last year at outside allergist was negative to shellfish per patient report.  She is not interested in retesting or reintroduction at this time. Interim history - no reactions/exposures.  Continue to avoid shellfish. For mild symptoms you can take over the counter antihistamines such as Benadryl and monitor symptoms closely. If symptoms worsen or if you have severe symptoms including breathing issues, throat closure, significant swelling, whole body hives, severe diarrhea and vomiting, lightheadedness then seek immediate medical care.   Rash Perioral rash and using topical triamcinolone with good benefit. Keep track of rashes and take pictures. See below for proper skin care. Use fragrance free and dye free products. No dryer sheets or fabric softener.   Use desonide  0.05% cream twice a day as needed for mild rash flares - okay to use on the face, neck, groin area. Do not use more than 1 week at a time. Use triamcinolone 0.1% cream twice a day as needed for rash flares. Do not use on the face, neck, armpits or groin area. Do not use more than 3 weeks in a row.   Return in about 6 months (around 09/25/2024).  Meds ordered this encounter  Medications   desonide  (DESOWEN ) 0.05 % cream    Sig: Apply topically 2 (two) times daily as needed (mild rash flare). okay to use on the face, neck, groin area. Do not use more than 1 week at a time.    Dispense:  30 g    Refill:  3   montelukast  (SINGULAIR ) 10 MG tablet    Sig: Take 1 tablet (10 mg total) by mouth at bedtime.    Dispense:  30 tablet     Refill:  3   Lab Orders  No laboratory test(s) ordered today    Diagnostics: None.   Medication List:  Current Outpatient Medications  Medication Sig Dispense Refill   B Complex Vitamins (B COMPLEX PO) Take by mouth.     desonide  (DESOWEN ) 0.05 % cream Apply topically 2 (two) times daily as needed (mild rash flare). okay to use on the face, neck, groin area. Do not use more than 1 week at a time. 30 g 3   fexofenadine (ALLEGRA ALLERGY ) 180 MG tablet Take 180 mg by mouth daily.     montelukast  (SINGULAIR ) 10 MG tablet Take 1 tablet (10 mg total) by mouth at bedtime. 30 tablet 3   olopatadine (PATANOL) 0.1 % ophthalmic solution Place 1 drop into both eyes daily.     Olopatadine-Mometasone (RYALTRIS ) 665-25 MCG/ACT SUSP Place 1-2 sprays into the nose in the morning and at bedtime. 29 g 5   VITAMIN D  PO Take 5,000 Units by mouth daily.     No current facility-administered medications for this visit.   Allergies: Allergies  Allergen Reactions   Flagyl [Metronidazole] Anaphylaxis   Shellfish Allergy  Diarrhea   I reviewed her past medical history, social history, family history, and environmental history and no significant changes have been reported from her previous visit.  Review of Systems  Constitutional:  Negative for appetite  change, chills, fever and unexpected weight change.  HENT:  Positive for congestion, rhinorrhea and sneezing.   Eyes:  Positive for itching.  Respiratory:  Negative for chest tightness, shortness of breath and wheezing.   Cardiovascular:  Negative for chest pain.  Gastrointestinal:  Negative for abdominal pain.  Genitourinary:  Negative for difficulty urinating.  Skin:  Positive for rash.  Allergic/Immunologic: Positive for environmental allergies.  Neurological:  Negative for headaches.    Objective: BP 116/76 (BP Location: Right Arm, Patient Position: Sitting, Cuff Size: Normal)   Pulse 82   Temp 98.3 F (36.8 C) (Temporal)   Resp 18   Ht 5'  3.39" (1.61 m)   Wt 142 lb 6.4 oz (64.6 kg)   LMP 07/11/2019   SpO2 98%   BMI 24.92 kg/m  Body mass index is 24.92 kg/m. Physical Exam Vitals and nursing note reviewed.  Constitutional:      Appearance: Normal appearance. She is well-developed.  HENT:     Head: Normocephalic and atraumatic.     Right Ear: External ear normal. There is impacted cerumen.     Left Ear: External ear normal. There is impacted cerumen.     Nose: Nose normal.     Mouth/Throat:     Mouth: Mucous membranes are moist.     Pharynx: Oropharynx is clear.  Eyes:     Conjunctiva/sclera: Conjunctivae normal.  Cardiovascular:     Rate and Rhythm: Normal rate and regular rhythm.     Heart sounds: Normal heart sounds. No murmur heard.    No friction rub. No gallop.  Pulmonary:     Effort: Pulmonary effort is normal.     Breath sounds: Normal breath sounds. No wheezing, rhonchi or rales.  Musculoskeletal:     Cervical back: Neck supple.  Skin:    General: Skin is warm.     Findings: No rash.  Neurological:     Mental Status: She is alert and oriented to person, place, and time.  Psychiatric:        Behavior: Behavior normal.   Previous notes and tests were reviewed. The plan was reviewed with the patient/family, and all questions/concerned were addressed.  It was my pleasure to see Joelyn today and participate in her care. Please feel free to contact me with any questions or concerns.  Sincerely,  Eudelia Hero, DO Allergy  & Immunology  [facility]  [place] office: [phone] [facility] office: [phone]

## 2024-03-25 ENCOUNTER — Ambulatory Visit: Payer: Self-pay | Admitting: Allergy

## 2024-03-25 ENCOUNTER — Encounter: Payer: Self-pay | Admitting: Allergy

## 2024-03-25 ENCOUNTER — Other Ambulatory Visit: Payer: Self-pay

## 2024-03-25 VITALS — BP 116/76 | HR 82 | Temp 98.3°F | Resp 18 | Ht 63.39 in | Wt 142.4 lb

## 2024-03-25 DIAGNOSIS — J3089 Other allergic rhinitis: Secondary | ICD-10-CM

## 2024-03-25 DIAGNOSIS — R21 Rash and other nonspecific skin eruption: Secondary | ICD-10-CM

## 2024-03-25 DIAGNOSIS — T781XXD Other adverse food reactions, not elsewhere classified, subsequent encounter: Secondary | ICD-10-CM | POA: Diagnosis not present

## 2024-03-25 DIAGNOSIS — J302 Other seasonal allergic rhinitis: Secondary | ICD-10-CM

## 2024-03-25 DIAGNOSIS — H1010 Acute atopic conjunctivitis, unspecified eye: Secondary | ICD-10-CM

## 2024-03-25 DIAGNOSIS — H1013 Acute atopic conjunctivitis, bilateral: Secondary | ICD-10-CM

## 2024-03-25 MED ORDER — MONTELUKAST SODIUM 10 MG PO TABS: 10.0000 mg | ORAL_TABLET | Freq: Every day | ORAL | 3 refills | Status: AC

## 2024-03-25 MED ORDER — DESONIDE 0.05 % EX CREA: TOPICAL_CREAM | Freq: Two times a day (BID) | CUTANEOUS | 3 refills | Status: AC | PRN

## 2024-03-25 NOTE — Patient Instructions (Addendum)
 Environmental allergies 2023 skin testing positive to trees, grass, ragweed, weed, dust mites, cat.  Continue environmental control measures as below. Use over the counter antihistamines such as Zyrtec (cetirizine), Claritin (loratadine), Allegra (fexofenadine), or Xyzal (levocetirizine) daily as needed. May take twice a day during allergy  flares. May switch antihistamines every few months. Use olopatadine eye drops 0.2% once a day as needed for itchy/watery eyes. Continue Ryaltris  (olopatadine + mometasone nasal spray combination) 1-2 sprays per nostril twice a day.  Nasal saline spray (i.e., Simply Saline) or nasal saline lavage (i.e., NeilMed) is recommended as needed and prior to medicated nasal sprays. Start Singulair  (montelukast ) 10mg  daily at night. Cautioned that in some children/adults can experience behavioral changes including hyperactivity, agitation, depression, sleep disturbances and suicidal ideations. These side effects are rare, but if you notice them you should notify me and discontinue Singulair  (montelukast ).  Continue allergy  injections. 2 injections. Let us  know when ready to start.  Had a detailed discussion with patient/family that clinical history is suggestive of allergic rhinitis, and may benefit from allergy  immunotherapy (AIT). Discussed in detail regarding the dosing, schedule, side effects (mild to moderate local allergic reaction and rarely systemic allergic reactions including anaphylaxis), and benefits (significant improvement in nasal symptoms, seasonal flares of asthma) of immunotherapy with the patient. There is significant time commitment involved with allergy  shots, which includes weekly immunotherapy injections for first 9-12 months and then biweekly to monthly injections for 3-5 years. Consent was signed.  Rash Keep track of rashes and take pictures. See below for proper skin care. Use fragrance free and dye free products. No dryer sheets or fabric  softener.   Use desonide  0.05% cream twice a day as needed for mild rash flares - okay to use on the face, neck, groin area. Do not use more than 1 week at a time. Use triamcinolone 0.1% cream twice a day as needed for rash flares. Do not use on the face, neck, armpits or groin area. Do not use more than 3 weeks in a row.   Food Continue to avoid shellfish. For mild symptoms you can take over the counter antihistamines such as Benadryl and monitor symptoms closely. If symptoms worsen or if you have severe symptoms including breathing issues, throat closure, significant swelling, whole body hives, severe diarrhea and vomiting, lightheadedness then seek immediate medical care.  Follow up in 6 months or sooner if needed.    Skin care recommendations  Bath time: Always use lukewarm water. AVOID very hot or cold water. Keep bathing time to 5-10 minutes. Do NOT use bubble bath. Use a mild soap and use just enough to wash the dirty areas. Do NOT scrub skin vigorously.  After bathing, pat dry your skin with a towel. Do NOT rub or scrub the skin.  Moisturizers and prescriptions:  ALWAYS apply moisturizers immediately after bathing (within 3 minutes). This helps to lock-in moisture. Use the moisturizer several times a day over the whole body. Good summer moisturizers include: Aveeno, CeraVe, Cetaphil. Good winter moisturizers include: Aquaphor, Vaseline, Cerave, Cetaphil, Eucerin, Vanicream. When using moisturizers along with medications, the moisturizer should be applied about one hour after applying the medication to prevent diluting effect of the medication or moisturize around where you applied the medications. When not using medications, the moisturizer can be continued twice daily as maintenance.  Laundry and clothing: Avoid laundry products with added color or perfumes. Use unscented hypo-allergenic laundry products such as Tide free, Cheer free & gentle, and All free and clear.  If the  skin still seems dry or sensitive, you can try double-rinsing the clothes. Avoid tight or scratchy clothing such as wool. Do not use fabric softeners or dyer sheets.   Reducing Pollen Exposure Pollen seasons: trees (spring), grass (summer) and ragweed/weeds (fall). Keep windows closed in your home and car to lower pollen exposure.  Install air conditioning in the bedroom and throughout the house if possible.  Avoid going out in dry windy days - especially early morning. Pollen counts are highest between 5 - 10 AM and on dry, hot and windy days.  Save outside activities for late afternoon or after a heavy rain, when pollen levels are lower.  Avoid mowing of grass if you have grass pollen allergy . Be aware that pollen can also be transported indoors on people and pets.  Dry your clothes in an automatic dryer rather than hanging them outside where they might collect pollen.  Rinse hair and eyes before bedtime. Control of House Dust Mite Allergen Dust mite allergens are a common trigger of allergy  and asthma symptoms. While they can be found throughout the house, these microscopic creatures thrive in warm, humid environments such as bedding, upholstered furniture and carpeting. Because so much time is spent in the bedroom, it is essential to reduce mite levels there.  Encase pillows, mattresses, and box springs in special allergen-proof fabric covers or airtight, zippered plastic covers.  Bedding should be washed weekly in hot water (130 F) and dried in a hot dryer. Allergen-proof covers are available for comforters and pillows that can't be regularly washed.  Wash the allergy -proof covers every few months. Minimize clutter in the bedroom. Keep pets out of the bedroom.  Keep humidity less than 50% by using a dehumidifier or air conditioning. You can buy a humidity measuring device called a hygrometer to monitor this.  If possible, replace carpets with hardwood, linoleum, or washable area rugs. If  that's not possible, vacuum frequently with a vacuum that has a HEPA filter. Remove all upholstered furniture and non-washable window drapes from the bedroom. Remove all non-washable stuffed toys from the bedroom.  Wash stuffed toys weekly.  Pet Allergen Avoidance: Contrary to popular opinion, there are no "hypoallergenic" breeds of dogs or cats. That is because people are not allergic to an animal's hair, but to an allergen found in the animal's saliva, dander (dead skin flakes) or urine. Pet allergy  symptoms typically occur within minutes. For some people, symptoms can build up and become most severe 8 to 12 hours after contact with the animal. People with severe allergies can experience reactions in public places if dander has been transported on the pet owners' clothing. Keeping an animal outdoors is only a partial solution, since homes with pets in the yard still have higher concentrations of animal allergens. Before getting a pet, ask your allergist to determine if you are allergic to animals. If your pet is already considered part of your family, try to minimize contact and keep the pet out of the bedroom and other rooms where you spend a great deal of time. As with dust mites, vacuum carpets often or replace carpet with a hardwood floor, tile or linoleum. High-efficiency particulate air (HEPA) cleaners can reduce allergen levels over time. While dander and saliva are the source of cat and dog allergens, urine is the source of allergens from rabbits, hamsters, mice and [place] pigs; so ask a non-allergic family member to clean the animal's cage. If you have a pet allergy , talk to your allergist  about the potential for allergy  immunotherapy (allergy  shots). This strategy can often provide long-term relief.

## 2024-06-24 ENCOUNTER — Ambulatory Visit: Admitting: Internal Medicine

## 2024-06-24 ENCOUNTER — Encounter: Payer: Self-pay | Admitting: Internal Medicine

## 2024-06-24 VITALS — BP 102/78 | HR 76 | Temp 97.9°F | Ht 63.0 in | Wt 145.7 lb

## 2024-06-24 DIAGNOSIS — R140 Abdominal distension (gaseous): Secondary | ICD-10-CM | POA: Diagnosis not present

## 2024-06-24 DIAGNOSIS — E782 Mixed hyperlipidemia: Secondary | ICD-10-CM

## 2024-06-24 LAB — CBC WITH DIFFERENTIAL/PLATELET
Basophils Absolute: 0 K/uL (ref 0.0–0.1)
Basophils Relative: 0.4 % (ref 0.0–3.0)
Eosinophils Absolute: 0 K/uL (ref 0.0–0.7)
Eosinophils Relative: 0.5 % (ref 0.0–5.0)
HCT: 38.5 % (ref 36.0–46.0)
Hemoglobin: 12.9 g/dL (ref 12.0–15.0)
Lymphocytes Relative: 33.1 % (ref 12.0–46.0)
Lymphs Abs: 1.4 K/uL (ref 0.7–4.0)
MCHC: 33.4 g/dL (ref 30.0–36.0)
MCV: 94.1 fl (ref 78.0–100.0)
Monocytes Absolute: 0.3 K/uL (ref 0.1–1.0)
Monocytes Relative: 7.4 % (ref 3.0–12.0)
Neutro Abs: 2.5 K/uL (ref 1.4–7.7)
Neutrophils Relative %: 58.6 % (ref 43.0–77.0)
Platelets: 283 K/uL (ref 150.0–400.0)
RBC: 4.1 Mil/uL (ref 3.87–5.11)
RDW: 12.3 % (ref 11.5–15.5)
WBC: 4.3 K/uL (ref 4.0–10.5)

## 2024-06-24 LAB — LIPID PANEL
Cholesterol: 244 mg/dL — ABNORMAL HIGH (ref 0–200)
HDL: 61.9 mg/dL (ref >39.00)
LDL Cholesterol: 121 mg/dL — ABNORMAL HIGH (ref 0–99)
NonHDL: 181.84
Total CHOL/HDL Ratio: 4
Triglycerides: 303 mg/dL — ABNORMAL HIGH (ref 0.0–149.0)
VLDL: 60.6 mg/dL — ABNORMAL HIGH (ref 0.0–40.0)

## 2024-06-24 LAB — COMPREHENSIVE METABOLIC PANEL WITH GFR
ALT: 13 U/L (ref 0–35)
AST: 15 U/L (ref 0–37)
Albumin: 4.4 g/dL (ref 3.5–5.2)
Alkaline Phosphatase: 82 U/L (ref 39–117)
BUN: 10 mg/dL (ref 6–23)
CO2: 32 meq/L (ref 19–32)
Calcium: 9.4 mg/dL (ref 8.4–10.5)
Chloride: 103 meq/L (ref 96–112)
Creatinine, Ser: 0.57 mg/dL (ref 0.40–1.20)
GFR: 109.32 mL/min (ref >60.00)
Glucose, Bld: 103 mg/dL — ABNORMAL HIGH (ref 70–99)
Potassium: 4.5 meq/L (ref 3.5–5.1)
Sodium: 142 meq/L (ref 135–145)
Total Bilirubin: 0.4 mg/dL (ref 0.2–1.2)
Total Protein: 7.4 g/dL (ref 6.0–8.3)

## 2024-06-24 LAB — TSH: TSH: 0.71 u[IU]/mL (ref 0.35–5.50)

## 2024-06-24 LAB — HEMOGLOBIN A1C: Hgb A1c MFr Bld: 5.3 % (ref 4.6–6.5)

## 2024-06-24 LAB — VITAMIN B12: Vitamin B-12: 778 pg/mL (ref 211–911)

## 2024-06-24 LAB — VITAMIN D 25 HYDROXY (VIT D DEFICIENCY, FRACTURES): VITD: 45.01 ng/mL (ref 30.00–100.00)

## 2024-06-24 MED ORDER — SACCHAROMYCES BOULARDII 250 MG PO CAPS: 250.0000 mg | ORAL_CAPSULE | Freq: Two times a day (BID) | ORAL | 0 refills | Status: AC

## 2024-06-24 NOTE — Progress Notes (Signed)
 New Patient Office Visit     CC/Reason for Visit: Establish care, annual preventive exam, discuss acute concerns Previous PCP: Shera Mace, MD Last Visit: Unknown  HPI: Meredith Lee is a 48 y.o. female who is coming in today for the above mentioned reasons. Past Medical History is significant for: Hyperlipidemia not on medication.  She does not smoke, drinks only occasionally, past surgical history significant for hysterectomy in 2020.  Ever since then she has been dealing with bowel issues including bloating, soft stools, flatulence.  She had a colonoscopy that per her report was normal.  Family history significant for diabetes and heart disease in father, hyperlipidemia in mother, maternal grandmother with colon cancer.  Cancer screening is up-to-date.   Past Medical/Surgical History: Past Medical History:  Diagnosis Date   Amenorrhea    Depression    Family history of breast cancer    Family history of colon cancer    Fibroid    Heart murmur    as a child   Menstrual cramps    Ovarian cyst     Past Surgical History:  Procedure Laterality Date   BREAST BIOPSY Right 08/09/2021   BREAST BIOPSY Left 02/03/2023   US  LT BREAST BX W LOC DEV 1ST LESION IMG BX SPEC US  GUIDE 02/03/2023 [facility]MO[facility]AP[facility]   CYSTOSCOPY N/A 08/19/2019   Procedure: CYSTOSCOPY;  Surgeon: Jannis Kate Norris, MD;  Location: [facility];  Service: Gynecology;  Laterality: N/A;   LYSIS OF ADHESION N/A 08/19/2019   Procedure: LYSIS OF ADHESION;  Surgeon: Jertson, Jill Evelyn, MD;  Location: [facility];  Service: Gynecology;  Laterality: N/A;   MYOMECTOMY  2012   SHOULDER ADHESION RELEASE Left 02/18/2021   Dr.Timothy Beverley   TOTAL LAPAROSCOPIC HYSTERECTOMY WITH SALPINGECTOMY Bilateral 08/19/2019   Procedure: TOTAL LAPAROSCOPIC HYSTERECTOMY WITH BILATERAL SALPINGECTOMY,LEFT OOPHORECTOMY, EXTENSIVE LYSIS OF ADHESIONS;  Surgeon: Jertson, Jill Evelyn, MD;   Location: [facility];  Service: Gynecology;  Laterality: Bilateral;    Social History:  reports that she has never smoked. She has never been exposed to tobacco smoke. She has never used smokeless tobacco. She reports that she does not currently use alcohol. She reports that she does not use drugs.  Allergies: Allergies  Allergen Reactions   Flagyl [Metronidazole] Anaphylaxis   Shellfish Allergy  Diarrhea    Family History:  Family History  Problem Relation Age of Onset   Fibroids Mother    Colon polyps Mother    Diabetes Father    Fibroids Sister    Diabetes Sister    Colon cancer Maternal Grandmother    Lung cancer Maternal Grandmother    Breast cancer Maternal Grandmother        dx <50   Cancer Maternal Grandfather    Breast cancer Paternal Grandmother 55   Heart attack Paternal Grandmother    Obesity Maternal Aunt    Diabetes Maternal Aunt    Heart failure Maternal Aunt    Breast cancer Paternal Aunt 67       recurrance at 35   Brain cancer Paternal Aunt    Heart disease Paternal Uncle      Current Outpatient Medications:    B Complex Vitamins (B COMPLEX PO), Take by mouth., Disp: , Rfl:    fexofenadine (ALLEGRA ALLERGY ) 180 MG tablet, Take 180 mg by mouth daily., Disp: , Rfl:    montelukast  (SINGULAIR ) 10 MG tablet, Take 1 tablet (10 mg total) by mouth at bedtime., Disp: 30  tablet, Rfl: 3   olopatadine (PATANOL) 0.1 % ophthalmic solution, Place 1 drop into both eyes daily., Disp: , Rfl:    Olopatadine-Mometasone (RYALTRIS ) 665-25 MCG/ACT SUSP, Place 1-2 sprays into the nose in the morning and at bedtime., Disp: 29 g, Rfl: 5   saccharomyces boulardii (FLORASTOR) 250 MG capsule, Take 1 capsule (250 mg total) by mouth 2 (two) times daily., Disp: 180 capsule, Rfl: 0   VITAMIN D  PO, Take 5,000 Units by mouth daily., Disp: , Rfl:    desonide  (DESOWEN ) 0.05 % cream, Apply topically 2 (two) times daily as needed (mild rash flare). okay to use on the face,  neck, groin area. Do not use more than 1 week at a time. (Patient not taking: Reported on 06/24/2024), Disp: 30 g, Rfl: 3  Review of Systems:  Negative except as indicated in HPI.   Physical Exam: Vitals:   06/24/24 1311  BP: 102/78  Pulse: 76  Temp: 97.9 F (36.6 C)  TempSrc: Oral  SpO2: 99%  Weight: 145 lb 11.2 oz (66.1 kg)  Height: 5' 3 (1.6 m)   Body mass index is 25.81 kg/m.  Physical Exam Vitals reviewed.  Constitutional:      General: She is not in acute distress.    Appearance: Normal appearance. She is not ill-appearing, toxic-appearing or diaphoretic.  HENT:     Head: Normocephalic.     Right Ear: Tympanic membrane, ear canal and external ear normal. There is no impacted cerumen.     Left Ear: Tympanic membrane, ear canal and external ear normal. There is no impacted cerumen.     Nose: Nose normal.     Mouth/Throat:     Mouth: Mucous membranes are moist.     Pharynx: Oropharynx is clear. No oropharyngeal exudate or posterior oropharyngeal erythema.  Eyes:     General: No scleral icterus.       Right eye: No discharge.        Left eye: No discharge.     Conjunctiva/sclera: Conjunctivae normal.     Pupils: Pupils are equal, round, and reactive to light.  Neck:     Vascular: No carotid bruit.  Cardiovascular:     Rate and Rhythm: Normal rate and regular rhythm.     Pulses: Normal pulses.     Heart sounds: Normal heart sounds.  Pulmonary:     Effort: Pulmonary effort is normal. No respiratory distress.     Breath sounds: Normal breath sounds.  Abdominal:     General: Abdomen is flat. Bowel sounds are normal.     Palpations: Abdomen is soft.  Musculoskeletal:        General: Normal range of motion.     Cervical back: Normal range of motion.  Skin:    General: Skin is warm and dry.  Neurological:     General: No focal deficit present.     Mental Status: She is alert and oriented to person, place, and time. Mental status is at baseline.  Psychiatric:         Mood and Affect: Mood normal.        Behavior: Behavior normal.        Thought Content: Thought content normal.        Judgment: Judgment normal.     Flowsheet Row Office Visit from 06/24/2024 in [facility] [place]  PHQ-9 Total Score 1     Impression and Plan:  Mixed hyperlipidemia -     CBC with Differential/Platelet; Future -  Comprehensive metabolic panel with GFR; Future -     Hemoglobin A1c; Future -     Lipid panel; Future -     TSH; Future -     Vitamin B12; Future -     VITAMIN D  25 Hydroxy (Vit-D Deficiency, Fractures); Future  Bloating -     Saccharomyces boulardii; Take 1 capsule (250 mg total) by mouth 2 (two) times daily.  Dispense: 180 capsule; Refill: 0    -Recommend routine eye and dental care. -Healthy lifestyle discussed in detail. -Labs to be updated today. -Prostate cancer screening: Not applicable Health Maintenance  Topic Date Due   Hepatitis B Vaccine (1 of 3 - 19+ 3-dose series) Never done   HPV Vaccine (1 - 3-dose SCDM series) Never done   COVID-19 Vaccine (4 - 2024-25 season) 07/09/2023   Flu Shot  06/07/2024   Colon Cancer Screening  07/21/2030   DTaP/Tdap/Td vaccine (2 - Td or Tdap) 02/08/2033   Hepatitis C Screening  Completed   HIV Screening  Completed   Pneumococcal Vaccine  Aged Out   Meningitis B Vaccine  Aged Out      - Obtain records from GYN. - Wonder if she might have SIBO.  Start Florastor twice daily.  We have also discussed elimination dieting.  She will start with dairy for a couple weeks and then gluten for couple weeks.  If no improvement may consider referral back to GI.    Tully Theophilus Andrews, MD [facility]

## 2024-06-26 ENCOUNTER — Ambulatory Visit: Payer: Self-pay | Admitting: Internal Medicine

## 2024-06-26 DIAGNOSIS — E782 Mixed hyperlipidemia: Secondary | ICD-10-CM

## 2024-07-02 NOTE — Progress Notes (Unsigned)
 48 y.o. G0P0000 Married [place] American female here for annual exam.    Some night sweats.  Not many hot flashes now.   FSH 41.5. 04/21/22.   Working with a Health and safety inspector.   Elevated cholesterol. Not taking medication.   Teaching at [employer]. Would like to write more.  Married for 2 years.  PCP: Theophilus Andrews, Tully GRADE, MD   Patient's last menstrual period was 07/11/2019.           Sexually active: Yes.    The current method of family planning is Female partner.    Menopausal hormone therapy:  n/a Exercising: Yes.    Strength training, cardio, yoga Smoker:  no  OB History  Gravida Para Term Preterm AB Living  0 0 0 0 0 0  SAB IAB Ectopic Multiple Live Births  0 0 0 0 0     HEALTH MAINTENANCE: Last 2 paps:  06/18/20 neg HR HPV neg, 06/13/19 neg HPV neg History of abnormal Pap or positive HPV:  yes, 12/08/17 colpo  Mammogram:   01/25/24 Breast Density Cat C, BIRADS Cat 1 neg  Colonoscopy:  07/21/20 - hemorrhoids - due in 2031. Bone Density:  n/a  Result  n/a   Immunization History  Administered Date(s) Administered   PFIZER(Purple Top)SARS-COV-2 Vaccination 03/26/2020, 04/21/2020, 10/15/2020   Tdap 02/09/2023      reports that she has never smoked. She has never been exposed to tobacco smoke. She has never used smokeless tobacco. She reports that she does not currently use alcohol. She reports that she does not use drugs.  Past Medical History:  Diagnosis Date   Amenorrhea    Depression    Family history of breast cancer    Family history of colon cancer    Fibroid    Heart murmur    as a child   High cholesterol    Menstrual cramps    Ovarian cyst     Past Surgical History:  Procedure Laterality Date   BREAST BIOPSY Right 08/09/2021   BREAST BIOPSY Left 02/03/2023   US  LT BREAST BX W LOC DEV 1ST LESION IMG BX SPEC US  GUIDE 02/03/2023 [facility]AMMOGRAP[facility]   CYSTOSCOPY N/A 08/19/2019   Procedure: CYSTOSCOPY;  Surgeon: Jannis Kate Norris, MD;  [facility];  Service: Gynecology;  Laterality: N/A;   LYSIS OF ADHESION N/A 08/19/2019   Procedure: LYSIS OF ADHESION;  Surgeon: Jertson, Jill Evelyn, MD;  Location: [facility];  Service: Gynecology;  Laterality: N/A;   MYOMECTOMY  2012   SHOULDER ADHESION RELEASE Left 02/18/2021   Dr.Timothy Beverley   TOTAL LAPAROSCOPIC HYSTERECTOMY WITH SALPINGECTOMY Bilateral 08/19/2019   Procedure: TOTAL LAPAROSCOPIC HYSTERECTOMY WITH BILATERAL SALPINGECTOMY,LEFT OOPHORECTOMY, EXTENSIVE LYSIS OF ADHESIONS;  Surgeon: Jertson, Jill Evelyn, MD;  Location: [facility];  Service: Gynecology;  Laterality: Bilateral;    Current Outpatient Medications  Medication Sig Dispense Refill   B Complex Vitamins (B COMPLEX PO) Take by mouth.     desonide  (DESOWEN ) 0.05 % cream Apply topically 2 (two) times daily as needed (mild rash flare). okay to use on the face, neck, groin area. Do not use more than 1 week at a time. 30 g 3   fexofenadine (ALLEGRA ALLERGY ) 180 MG tablet Take 180 mg by mouth daily.     montelukast  (SINGULAIR ) 10 MG tablet Take 1 tablet (10 mg total) by mouth at bedtime. 30 tablet 3   olopatadine (PATANOL) 0.1 % ophthalmic solution Place 1 drop  into both eyes daily.     Olopatadine-Mometasone (RYALTRIS ) 665-25 MCG/ACT SUSP Place 1-2 sprays into the nose in the morning and at bedtime. 29 g 5   saccharomyces boulardii (FLORASTOR) 250 MG capsule Take 1 capsule (250 mg total) by mouth 2 (two) times daily. 180 capsule 0   VITAMIN D  PO Take 5,000 Units by mouth daily.     No current facility-administered medications for this visit.    ALLERGIES: Flagyl [metronidazole] and Shellfish allergy   Family History  Problem Relation Age of Onset   Fibroids Mother    Colon polyps Mother    Diabetes Father    Fibroids Sister    Diabetes Sister    Colon cancer Maternal Grandmother    Lung cancer Maternal Grandmother    Breast cancer Maternal Grandmother        dx  <50   Cancer Maternal Grandfather    Breast cancer Paternal Grandmother 20   Heart attack Paternal Grandmother    Obesity Maternal Aunt    Diabetes Maternal Aunt    Heart failure Maternal Aunt    Breast cancer Paternal Aunt 83       recurrance at 60   Brain cancer Paternal Aunt    Heart disease Paternal Uncle     Review of Systems  All other systems reviewed and are negative.   PHYSICAL EXAM:  BP 106/64 (BP Location: Left Arm, Patient Position: Sitting)   Pulse 74   Ht 5' 4.25 (1.632 m)   Wt 143 lb (64.9 kg)   LMP 07/11/2019   SpO2 99%   BMI 24.36 kg/m     General appearance: alert, cooperative and appears stated age Head: normocephalic, without obvious abnormality, atraumatic Neck: no adenopathy, supple, symmetrical, trachea midline and thyroid  normal to inspection and palpation Lungs: clear to auscultation bilaterally Breasts: normal appearance, no masses or tenderness, No nipple retraction or dimpling, No nipple discharge or bleeding, No axillary adenopathy Heart: regular rate and rhythm Abdomen: soft, non-tender; no masses, no organomegaly Extremities: extremities normal, atraumatic, no cyanosis or edema Skin: skin color, texture, turgor normal. No rashes or lesions Lymph nodes: cervical, supraclavicular, and axillary nodes normal. Neurologic: grossly normal  Pelvic: External genitalia:  no lesions              No abnormal inguinal nodes palpated.              Urethra:  normal appearing urethra with no masses, tenderness or lesions              Bartholins and Skenes: normal                 Vagina: normal appearing vagina with normal color and discharge, no lesions              Cervix:  absent              Pap taken: no Bimanual Exam:  Uterus:  absent              Adnexa: no mass, fullness, tenderness              Rectal exam: yes.  Confirms.              Anus:  normal sphincter tone, no lesions  Chaperone was present for exam:  Dereck BROCKS, CMA  ASSESSMENT: Well  woman visit with gynecologic exam. Status post total laparoscopic hysterectomy, bilateral salpingectomy, left oophorectomy, LOA, 2020.   Hx fibroids, adenomyosis, endometriosis.  Benign left ovary.  Menopausal symptoms. FH breast cancer. Increased risk of breast cancer, 23.5%.  Doing MRI of breast every 2 years.  Decreased libido. Vaginal atrophy.  PHQ-2-9: 0  PLAN: Mammogram screening discussed. Self breast awareness reviewed. Pap and HRV collected:  no.  Not indicated. Guidelines for Calcium, Vitamin D , regular exercise program including cardiovascular and weight bearing exercise. Medication refills:  NA Breast MRI ordered.   Check FSH and estradiol . Follow up:  yearly and prn.

## 2024-07-03 ENCOUNTER — Ambulatory Visit (INDEPENDENT_AMBULATORY_CARE_PROVIDER_SITE_OTHER): Payer: BC Managed Care – PPO | Admitting: Obstetrics and Gynecology

## 2024-07-03 ENCOUNTER — Other Ambulatory Visit: Payer: Self-pay | Admitting: Obstetrics and Gynecology

## 2024-07-03 ENCOUNTER — Encounter: Payer: Self-pay | Admitting: Obstetrics and Gynecology

## 2024-07-03 ENCOUNTER — Telehealth: Payer: Self-pay | Admitting: Obstetrics and Gynecology

## 2024-07-03 VITALS — BP 106/64 | HR 74 | Ht 64.25 in | Wt 143.0 lb

## 2024-07-03 DIAGNOSIS — Z01419 Encounter for gynecological examination (general) (routine) without abnormal findings: Secondary | ICD-10-CM

## 2024-07-03 DIAGNOSIS — N951 Menopausal and female climacteric states: Secondary | ICD-10-CM

## 2024-07-03 DIAGNOSIS — Z1331 Encounter for screening for depression: Secondary | ICD-10-CM | POA: Diagnosis not present

## 2024-07-03 DIAGNOSIS — Z9189 Other specified personal risk factors, not elsewhere classified: Secondary | ICD-10-CM | POA: Diagnosis not present

## 2024-07-03 NOTE — Telephone Encounter (Signed)
 Patient needs breast MRI in September for increased lifetime risk of breast cancer, 23.5%.   I placed the order.

## 2024-07-03 NOTE — Addendum Note (Signed)
 Addended by: Cristobal Advani on: 07/03/2024 10:45 AM   Modules accepted: Orders

## 2024-07-03 NOTE — Patient Instructions (Addendum)
 Calcium in Foods Calcium is a mineral in the body. Of all the minerals in your body, you have the most calcium. Most of the body's calcium supply is stored in bones and teeth. Calcium helps many parts of the body work, including: Blood and blood vessels. Nerves. Hormones. Muscles. Bones and teeth. When your calcium stores are low, you may be at risk for low bone mass, bone loss, and broken bones. When you get enough calcium, it helps to support strong bones and teeth throughout your life. Calcium is especially important for: Children during growth spurts. Females during adolescence. Females who are pregnant or breastfeeding. Females after their menstrual cycle stops (postmenopausal). Females whose menstrual cycle has stopped because of an eating disorder or regular intense exercise. People who can't eat or digest dairy products. People who eat a vegan diet. Recommended daily amounts of calcium: Females (ages 87 to 55): 1,000 mg per day. Females (ages 35 and older): 1,200 mg per day. Males (ages 53 to 45): 1,000 mg per day. Males (ages 43 and older): 1,200 mg per day. Females (ages 88 to 28): 1,300 mg per day. Males (ages 41 to 56): 1,300 mg per day. General information Eat foods that are high in calcium. Try to get most of your calcium from food. Some people may benefit from taking calcium supplements. Check with your health care provider or an expert in healthy eating called a dietitian before starting any calcium supplements. Calcium supplements may interact with certain medicines. Too much calcium may cause other health problems, such as trouble pooping and kidney stones. For the body to absorb calcium, it needs vitamin D. Sources of vitamin D include: Skin exposure to direct sunlight. Foods, such as egg yolks, liver, mushrooms, saltwater fish, and fortified milk. Vitamin D supplements. Check with your provider or dietitian before starting any vitamin D supplements. The amount of  calcium that is absorbed in the body varies with type of food. Talk to a dietitian about what foods are best for you, especially if you are eat a vegan diet or don't eat dairy. What foods are high in calcium?  Foods that are high in calcium contain more than 100 milligrams per serving. Fruits Fortified orange juice or other fruit juice, 300 mg per 8 oz (237 mL) serving. Vegetables Collard greens, 260 mg per 1 cup (130 g) serving, cooked. Kale, 180 mg per 1 cup (118 g) serving, cooked. Bok choy, 180 mg per 1 cup (170 g) serving, cooked Grains Fortified frozen waffles, 200 mg in 2 waffles. Oatmeal, 180 mg in 1 cup (234 g) serving, cooked. Fortified white bread, 175 mg per slice. Meats and other proteins Sardines, canned with bones, 350 mg per 3.75 oz (92 g) serving. Salmon, canned with bones, 168 mg per 3 oz (85 g) serving. Canned shrimp, 125 mg per 3 oz (85 g) serving. Baked beans, 120 mg per 1 cup (266 g) serving. Tofu, firm, made with calcium sulfate, 861 mg per  cup (126 g) serving. Dairy Yogurt, plain, low-fat, 448 mg per 1 cup (245 g) serving Nonfat milk, 300 mg per 1 cup (245 g) serving. American cheese, 145 mg per 1 oz (21 g) serving or 1 slice. Cheddar cheese, 200 mg per 1 oz (28 g) serving or 1 slice. Cottage cheese 2%, 125 mg per  cup (113 g) serving. Fortified soy, rice, or almond milk, 300 mg per 1 cup (237 mL) serving. Mozzarella, part skim, 210 mg per 1 oz (21 g) serving. The items listed  above may not be a complete list of foods high in calcium. Actual amounts of calcium may be different depending on processing. Contact a dietitian for more information. What foods are lower in calcium? Foods that are lower in calcium contain 50 mg or less per serving. Fruits Apple, 1 medium, about 6 mg. Banana, 1 medium, about 12 mg. Vegetables Lettuce, 19 mg per 1 cup (35 g) serving. Tomato, 1 small, about 11 mg. Grains Rice, white, 8 mg per  cup (79 g) serving. Boiled  potatoes, 14 mg per 1 cup (160 g) serving. White bread, 6 mg per slice. Meats and other proteins Egg, 24 mg per 1 egg (50 g). Red meat, 7 mg per 4 oz (80 g) serving. Chicken, 17 mg per 4 oz (113 g) serving. Fish, cod, or trout, 20 mg per 4 oz (140 g) serving. Dairy Cream cheese, regular, 14 mg per 1 Tbsp (15 g) serving. Brie cheese, 50 mg per 1 oz (32 g) serving. The items listed above may not be a complete list of foods lower in calcium. Actual amounts of calcium may be different depending on processing. Contact a dietitian for more information. This information is not intended to replace advice given to you by your health care provider. Make sure you discuss any questions you have with your health care provider. Document Revised: 07/22/2023 Document Reviewed: 07/22/2023 [employer] Patient Education  2024 [employer]  EXERCISE AND DIET:  We recommended that you start or continue a regular exercise program for good health. Regular exercise means any activity that makes your heart beat faster and makes you sweat.  We recommend exercising at least 30 minutes per day at least 3 days a week, preferably 4 or 5.  We also recommend a diet low in fat and sugar.  Inactivity, poor dietary choices and obesity can cause diabetes, heart attack, stroke, and kidney damage, among others.    ALCOHOL AND SMOKING:  Women should limit their alcohol intake to no more than 7 drinks/beers/glasses of wine (combined, not each!) per week. Moderation of alcohol intake to this level decreases your risk of breast cancer and liver damage. And of course, no recreational drugs are part of a healthy lifestyle.  And absolutely no smoking or even second hand smoke. Most people know smoking can cause heart and lung diseases, but did you know it also contributes to weakening of your bones? Aging of your skin?  Yellowing of your teeth and nails?  CALCIUM AND VITAMIN D:  Adequate intake of calcium and Vitamin D are recommended.  The  recommendations for exact amounts of these supplements seem to change often, but generally speaking 600 mg of calcium (either carbonate or citrate) and 800 units of Vitamin D per day seems prudent. Certain women may benefit from higher intake of Vitamin D.  If you are among these women, your doctor will have told you during your visit.    PAP SMEARS:  Pap smears, to check for cervical cancer or precancers,  have traditionally been done yearly, although recent scientific advances have shown that most women can have pap smears less often.  However, every woman still should have a physical exam from her gynecologist every year. It will include a breast check, inspection of the vulva and vagina to check for abnormal growths or skin changes, a visual exam of the cervix, and then an exam to evaluate the size and shape of the uterus and ovaries.  And after 46 years of age, a rectal exam  is indicated to check for rectal cancers. We will also provide age appropriate advice regarding health maintenance, like when you should have certain vaccines, screening for sexually transmitted diseases, bone density testing, colonoscopy, mammograms, etc.   MAMMOGRAMS:  All women over 11 years old should have a yearly mammogram. Many facilities now offer a "3D" mammogram, which may cost around $50 extra out of pocket. If possible,  we recommend you accept the option to have the 3D mammogram performed.  It both reduces the number of women who will be called back for extra views which then turn out to be normal, and it is better than the routine mammogram at detecting truly abnormal areas.    COLONOSCOPY:  Colonoscopy to screen for colon cancer is recommended for all women at age 8.  We know, you hate the idea of the prep.  We agree, BUT, having colon cancer and not knowing it is worse!!  Colon cancer so often starts as a polyp that can be seen and removed at colonscopy, which can quite literally save your life!  And if your first  colonoscopy is normal and you have no family history of colon cancer, most women don't have to have it again for 10 years.  Once every ten years, you can do something that may end up saving your life, right?  We will be happy to help you get it scheduled when you are ready.  Be sure to check your insurance coverage so you understand how much it will cost.  It may be covered as a preventative service at no cost, but you should check your particular policy.

## 2024-07-23 NOTE — Telephone Encounter (Signed)
 Per review of epic, they have contacted her twice & left messages for her to schedule.

## 2024-08-01 ENCOUNTER — Encounter: Admitting: Family Medicine

## 2024-08-26 NOTE — Telephone Encounter (Signed)
 Spoke with patient. Patient states she still plans to proceed with breast MRI, no assistance needed for scheduling. PA and benefits process reviewed with patient, verbalizes understanding and is agreeable. Aware to call if any assistance needed.   Routing to provider for final review. Patient is agreeable to disposition. Will close encounter.

## 2024-11-09 ENCOUNTER — Other Ambulatory Visit

## 2025-07-07 ENCOUNTER — Ambulatory Visit: Admitting: Obstetrics and Gynecology
